# Patient Record
Sex: Female | Born: 1995 | Race: Black or African American | Hispanic: No | Marital: Single
Health system: Southern US, Community
[De-identification: ages and names within clinical notes are randomized; demographics above are authoritative.]

## PROBLEM LIST (undated history)

## (undated) DIAGNOSIS — L0293 Carbuncle, unspecified: Secondary | ICD-10-CM

## (undated) DIAGNOSIS — L0292 Furuncle, unspecified: Secondary | ICD-10-CM

## (undated) DIAGNOSIS — B009 Herpesviral infection, unspecified: Secondary | ICD-10-CM

## (undated) DIAGNOSIS — F419 Anxiety disorder, unspecified: Secondary | ICD-10-CM

## (undated) DIAGNOSIS — T7840XA Allergy, unspecified, initial encounter: Secondary | ICD-10-CM

## (undated) DIAGNOSIS — O24419 Gestational diabetes mellitus in pregnancy, unspecified control: Secondary | ICD-10-CM

## (undated) DIAGNOSIS — L732 Hidradenitis suppurativa: Secondary | ICD-10-CM

## (undated) HISTORY — DX: Carbuncle, unspecified: L02.93

## (undated) HISTORY — DX: Allergy, unspecified, initial encounter: T78.40XA

## (undated) HISTORY — DX: Furuncle, unspecified: L02.92

## (undated) HISTORY — DX: Morbid (severe) obesity due to excess calories: E66.01

## (undated) HISTORY — DX: Anxiety disorder, unspecified: F41.9

## (undated) HISTORY — DX: Herpesviral infection, unspecified: B00.9

---

## 2017-10-16 NOTE — Progress Notes (Addendum)
Gynecology Annual Exam  PCP: Patient, No Pcp Per  Chief Complaint:  Chief Complaint  Patient presents with  . Gynecologic Exam    itchy nipples    History of Present Illness: Patient is a 22 y.o. G0 presents for annual exam. The patient has no complaints today.   LMP: No LMP recorded. Patient has had an implant. Absent secondary to nexplanon  The patient is not sexually active. She currently uses Nexplanon for contraception. She has dyspareunia.  The patient does perform self breast exams.  There is no notable family history of breast or ovarian cancer in her family.  The patient wears seatbelts: yes.  The patient has regular exercise: not asked.    The patient denies current symptoms of depression.    Review of Systems: Review of Systems  Constitutional: Negative for chills and fever.  HENT: Negative for congestion.   Respiratory: Negative for cough and shortness of breath.   Cardiovascular: Negative for chest pain and palpitations.  Gastrointestinal: Negative for abdominal pain, constipation, diarrhea, heartburn, nausea and vomiting.  Genitourinary: Negative for dysuria, frequency and urgency.  Skin: Negative for itching and rash.  Neurological: Negative for dizziness and headaches.  Endo/Heme/Allergies: Negative for polydipsia.  Psychiatric/Behavioral: Negative for depression.    Past Medical History:  History reviewed. No pertinent past medical history.  Past Surgical History:  History reviewed. No pertinent surgical history.  Gynecologic History:  No LMP recorded. Patient has had an implant. Contraception: Luane Schoolexplanon Junly 2017 (previously depo)  Obstetric History: No obstetric history on file.  Family History:  Family History  Problem Relation Age of Onset  . Pancreatic cancer Paternal Grandmother     Social History:  Social History   Socioeconomic History  . Marital status: Single    Spouse name: Not on file  . Number of children: Not on file    . Years of education: Not on file  . Highest education level: Not on file  Social Needs  . Financial resource strain: Not on file  . Food insecurity - worry: Not on file  . Food insecurity - inability: Not on file  . Transportation needs - medical: Not on file  . Transportation needs - non-medical: Not on file  Occupational History  . Not on file  Tobacco Use  . Smoking status: Never Smoker  . Smokeless tobacco: Never Used  Substance and Sexual Activity  . Alcohol use: Yes  . Drug use: No  . Sexual activity: Not Currently    Partners: Male    Birth control/protection: Implant  Other Topics Concern  . Not on file  Social History Narrative  . Not on file    Allergies:  No Known Allergies  Medications: Prior to Admission medications   Not on File    Physical Exam Vitals: There were no vitals taken for this visit.  General: NAD HEENT: normocephalic, anicteric Thyroid: no enlargement, no palpable nodules Pulmonary: No increased work of breathing, CTAB Cardiovascular: RRR, distal pulses 2+ Breast: Breast symmetrical, no tenderness, no palpable nodules or masses, no skin or nipple retraction present, no nipple discharge.  No axillary or supraclavicular lymphadenopathy. Abdomen: NABS, soft, non-tender, non-distended.  Umbilicus without lesions.  No hepatomegaly, splenomegaly or masses palpable. No evidence of hernia  Genitourinary:  External: Normal external female genitalia.  Normal urethral meatus, normal  Bartholin's and Skene's glands.    Vagina: Normal vaginal mucosa, no evidence of prolapse.    Cervix: Grossly normal in appearance, no bleeding  Uterus:  Non-enlarged, mobile, normal contour.  No CMT  Adnexa: ovaries non-enlarged, no adnexal masses  Rectal: deferred  Lymphatic: no evidence of inguinal lymphadenopathy Extremities: no edema, erythema, or tenderness Neurologic: Grossly intact Psychiatric: mood appropriate, affect full  Female chaperone present for  pelvic and breast  portions of the physical exam    Assessment: 22 y.o. No obstetric history on file. routine annual exam  Plan: Problem List Items Addressed This Visit    None    Visit Diagnoses    Tinea corporis    -  Primary   Relevant Medications   nystatin cream (MYCOSTATIN)   Screening for malignant neoplasm of cervix       Relevant Orders   Pap IG w/ reflex to HPV when ASC-U   Breast screening       Encounter for gynecological examination without abnormal finding          1) 4) Gardasil Series discussed and if applicable offered to patient - Patient has previously completed 3 shot series   2) STI screening was offered and declined   3)  ASCCP guidelines and rational discussed.  Patient opts for every 3 years screening interval - no prior pap on record obtained today   4) Contraception - the patient is currently using  Nexplanon.  She is happy with her current form of contraception and plans to continue We discussed safe sex practices to reduce her furture risk of STI's.    5) Currently getting masters in clinical psychiatry  6)  Return in 1 year (on 10/17/2018) for annual.   Vena Austria, MD, [facility], [facility] 10/17/2017, 9:35 AM

## 2017-10-17 ENCOUNTER — Ambulatory Visit: Payer: Self-pay | Admitting: Family Medicine

## 2017-10-17 ENCOUNTER — Encounter: Payer: Self-pay | Admitting: Family Medicine

## 2017-10-17 ENCOUNTER — Ambulatory Visit (INDEPENDENT_AMBULATORY_CARE_PROVIDER_SITE_OTHER): Payer: BLUE CROSS/BLUE SHIELD | Admitting: Obstetrics and Gynecology

## 2017-10-17 ENCOUNTER — Ambulatory Visit (INDEPENDENT_AMBULATORY_CARE_PROVIDER_SITE_OTHER): Payer: BLUE CROSS/BLUE SHIELD | Admitting: Family Medicine

## 2017-10-17 ENCOUNTER — Encounter: Payer: Self-pay | Admitting: Obstetrics and Gynecology

## 2017-10-17 VITALS — BP 124/82 | HR 98 | Temp 98.5°F | Resp 14 | Ht 65.0 in | Wt 241.4 lb

## 2017-10-17 VITALS — BP 112/72 | HR 96 | Ht 64.0 in | Wt 242.0 lb

## 2017-10-17 DIAGNOSIS — Z124 Encounter for screening for malignant neoplasm of cervix: Secondary | ICD-10-CM

## 2017-10-17 DIAGNOSIS — Z8349 Family history of other endocrine, nutritional and metabolic diseases: Secondary | ICD-10-CM | POA: Insufficient documentation

## 2017-10-17 DIAGNOSIS — Z1231 Encounter for screening mammogram for malignant neoplasm of breast: Secondary | ICD-10-CM

## 2017-10-17 DIAGNOSIS — L0293 Carbuncle, unspecified: Secondary | ICD-10-CM

## 2017-10-17 DIAGNOSIS — B354 Tinea corporis: Secondary | ICD-10-CM

## 2017-10-17 DIAGNOSIS — Z01419 Encounter for gynecological examination (general) (routine) without abnormal findings: Secondary | ICD-10-CM

## 2017-10-17 DIAGNOSIS — Z1239 Encounter for other screening for malignant neoplasm of breast: Secondary | ICD-10-CM

## 2017-10-17 HISTORY — DX: Morbid (severe) obesity due to excess calories: E66.01

## 2017-10-17 MED ORDER — NYSTATIN 100000 UNIT/GM EX CREA: 1.0000 "application " | TOPICAL_CREAM | Freq: Two times a day (BID) | CUTANEOUS | 1 refills | Status: DC

## 2017-10-17 NOTE — Progress Notes (Signed)
BP 124/82   Pulse 98   Temp 98.5 F (36.9 C) (Oral)   Resp 14   Ht 5\' 5"  (1.651 m)   Wt 241 lb (109.3 kg)   BMI 40.10 kg/m    Subjective:    Patient ID: Monique Jackson, female    DOB: Aug 01, 1996, 22 y.o.   MRN: [id number]  HPI: Monique Jackson is a 22 y.o. female  Chief Complaint  Patient presents with  . Establish Care  . Obesity    discuss weight  . boils    lower abdomen    HPI  Patient is here to establish care and discuss weight loss Has been going to health dept She has boils on her lower abdomen She says they just come out of nowhere Few times they came up after shaving; ingrown hairs; irritated follicles; trims instead of shaves They go away on their own; used warm compresses; no fevers Mother and grandmother have them too One under the arm, but usually just in the groin Bactrim and clinda; under the umbilicus and along inguinal creases; one lanced She was on a back-to-back regimen of both antibiotics back in college; current places are resolving Grandmother gets period at 53 Had her gyn physical this morning at [facility]  She has morbid obesity; she is struggling; she can go back to when she started college Started Depo at 22 yo, then blew up like a house; 185 pounds to 250 pounds; that was just from Depo Hard to lose the weight now Now using Nexplanon; dropped from 250 to 245 pounds, fluctuating from 240 to 245 pounds Trying to eating better; trying to stick with [employer], hard to find food at home She tries to do healthier sandwiches, wheat She has started with her dad over the summer with walking Still trying to balance 2 jobs and school Have water beside her bed, drinks all day, craves a lot of water at night Sweat coin and samsung phone app Does not eat breakfast Mother has thyroid trouble, had to be removed and on medicine Father has type 2 DM; he is being and does not eat right, lots of sweets; getting under control  Depression screen [facility] 2/9  10/17/2017  Decreased Interest 0  Down, Depressed, Hopeless 1  PHQ - 2 Score 1    Relevant past medical, surgical, family and social history reviewed Past Medical History:  Diagnosis Date  . Anxiety   . Recurrent boils    History reviewed. No pertinent surgical history. Family History  Problem Relation Age of Onset  . Pancreatic cancer Paternal Grandmother   . Hypertension Mother   . Thyroid disease Mother   . Diabetes Father   . Hypertension Maternal Grandmother    Social History   Tobacco Use  . Smoking status: Never Smoker  . Smokeless tobacco: Never Used  Substance Use Topics  . Alcohol use: Yes    Comment: ocassionally  . Drug use: No   Interim medical history since last visit reviewed. Allergies and medications reviewed  Review of Systems Per HPI unless specifically indicated above     Objective:    BP 124/82   Pulse 98   Temp 98.5 F (36.9 C) (Oral)   Resp 14   Ht 5\' 5"  (1.651 m)   Wt 241 lb (109.3 kg)   BMI 40.10 kg/m   Wt Readings from Last 3 Encounters:  10/17/17 241 lb (109.3 kg)  10/17/17 242 lb (109.8 kg)    Physical Exam  Constitutional: She  appears well-developed and well-nourished.  Morbidly obese  HENT:  Mouth/Throat: Mucous membranes are normal.  Eyes: EOM are normal. No scleral icterus.  Cardiovascular: Normal rate and regular rhythm.  Pulmonary/Chest: Effort normal and breath sounds normal.  Neurological: She is alert. She displays no tremor.  Reflex Scores:      Patellar reflexes are 1+ on the right side and 1+ on the left side. Psychiatric: She has a normal mood and affect. Her behavior is normal. Her mood appears not anxious. Her speech is not delayed. She is not slowed. She does not exhibit a depressed mood.    No results found for this or any previous visit.    Assessment & Plan:   Problem List Items Addressed This Visit      Other   Recurrent boils    Patient reports that she does not need oral antibiotics right now;  we discussed the dangers of oral antibiotics, including risk for C diff (she is familiar with this, as her grandmother had it); if taking antibiotics orally in the future, kimchi or yogurt or probiotics; if any watery diarrhea in next two months after finishing, seek care right away; see AVS      Morbid obesity (HCC)    Check TSH free T4 given that her mother has thyroid disease; will refer to nutritionist; she'll monitor her daily calories, daily steps, water intake and return in 3-4 weeks for follow-up, review of data; she wants to try to lose on her own without resorting to pills or shots early on; I agree with this approach; discussed fast food, salt, sugar, fat content in prepared meals at restaurants; she will return in 3-4 weeks for discussion      Relevant Orders   TSH + free T4       Follow up plan: No Follow-up on file.  An after-visit summary was printed and given to the patient at check-out.  Please see the patient instructions which may contain other information and recommendations beyond what is mentioned above in the assessment and plan.  No orders of the defined types were placed in this encounter.   Orders Placed This Encounter  Procedures  . TSH + free T4   Offered other labs including glucose and lipids; patient politely declined

## 2017-10-17 NOTE — Patient Instructions (Addendum)
If you use antibiotics: Please do eat yogurt or kimchi or take a probiotic daily for the next month We want to replace the healthy germs in the gut If you notice foul, watery diarrhea in the next two months, schedule an appointment RIGHT AWAY or go to an urgent care or the emergency room if a holiday or over a weekend  Check out the information at [email or web address].org entitled "Nutrition for Weight Loss: What You Need to Know about Fad Diets" Try to lose between 1-2 pounds per week by taking in fewer calories and burning off more calories You can succeed by limiting portions, limiting foods dense in calories and fat, becoming more active, and drinking 8 glasses of water a day (64 ounces) Don't skip meals, especially breakfast, as skipping meals may alter your metabolism Do not use over-the-counter weight loss pills or gimmicks that claim rapid weight loss A healthy BMI (or body mass index) is between 18.5 and 24.9 You can calculate your ideal BMI at the NIH website [employer]huhttp://www.nhlbi.nih.gov/health/educational/lose_wt/BMI/bmicalc.htm  Check out any calorie counter that is free Keep track of steps Keep track of daiy water intake Bring those with you for our next appointment Read about diet medicine options which we may consider at future visit: Belviq (pill), Contrave (pill), Saxenda (injectable)

## 2017-10-17 NOTE — Patient Instructions (Signed)
If you use antibiotics: Please do eat yogurt or kimchi or take a probiotic daily for the next month We want to replace the healthy germs in the gut If you notice foul, watery diarrhea in the next two months, schedule an appointment RIGHT AWAY or go to an urgent care or the emergency room if a holiday or over a weekend  Check out the information at [email or web address].org entitled "Nutrition for Weight Loss: What You Need to Know about Fad Diets" Try to lose between 1-2 pounds per week by taking in fewer calories and burning off more calories You can succeed by limiting portions, limiting foods dense in calories and fat, becoming more active, and drinking 8 glasses of water a day (64 ounces) Don't skip meals, especially breakfast, as skipping meals may alter your metabolism Do not use over-the-counter weight loss pills or gimmicks that claim rapid weight loss A healthy BMI (or body mass index) is between 18.5 and 24.9 You can calculate your ideal BMI at the NIH website http://www.nhlbi.nih.gov/health/educational/lose_wt/BMI/bmicalc.htm  Check out any calorie counter that is free Keep track of steps Keep track of daiy water intake Bring those with you for our next appointment Read about diet medicine options which we may consider at future visit: Belviq (pill), Contrave (pill), Saxenda (injectable)   

## 2017-10-17 NOTE — Assessment & Plan Note (Signed)
See AVS

## 2017-10-17 NOTE — Patient Instructions (Signed)
Preventive Care 18-39 Years, Female Preventive care refers to lifestyle choices and visits with your health care provider that can promote health and wellness. What does preventive care include?  A yearly physical exam. This is also called an annual well check.  Dental exams once or twice a year.  Routine eye exams. Ask your health care provider how often you should have your eyes checked.  Personal lifestyle choices, including: ? Daily care of your teeth and gums. ? Regular physical activity. ? Eating a healthy diet. ? Avoiding tobacco and drug use. ? Limiting alcohol use. ? Practicing safe sex. ? Taking vitamin and mineral supplements as recommended by your health care provider. What happens during an annual well check? The services and screenings done by your health care provider during your annual well check will depend on your age, overall health, lifestyle risk factors, and family history of disease. Counseling Your health care provider may ask you questions about your:  Alcohol use.  Tobacco use.  Drug use.  Emotional well-being.  Home and relationship well-being.  Sexual activity.  Eating habits.  Work and work Statistician.  Method of birth control.  Menstrual cycle.  Pregnancy history.  Screening You may have the following tests or measurements:  Height, weight, and BMI.  Diabetes screening. This is done by checking your blood sugar (glucose) after you have not eaten for a while (fasting).  Blood pressure.  Lipid and cholesterol levels. These may be checked every 5 years starting at age 66.  Skin check.  Hepatitis C blood test.  Hepatitis B blood test.  Sexually transmitted disease (STD) testing.  BRCA-related cancer screening. This may be done if you have a family history of breast, ovarian, tubal, or peritoneal cancers.  Pelvic exam and Pap test. This may be done every 3 years starting at age 40. Starting at age 59, this may be done every 5  years if you have a Pap test in combination with an HPV test.  Discuss your test results, treatment options, and if necessary, the need for more tests with your health care provider. Vaccines Your health care provider may recommend certain vaccines, such as:  Influenza vaccine. This is recommended every year.  Tetanus, diphtheria, and acellular pertussis (Tdap, Td) vaccine. You may need a Td booster every 10 years.  Varicella vaccine. You may need this if you have not been vaccinated.  HPV vaccine. If you are 69 or younger, you may need three doses over 6 months.  Measles, mumps, and rubella (MMR) vaccine. You may need at least one dose of MMR. You may also need a second dose.  Pneumococcal 13-valent conjugate (PCV13) vaccine. You may need this if you have certain conditions and were not previously vaccinated.  Pneumococcal polysaccharide (PPSV23) vaccine. You may need one or two doses if you smoke cigarettes or if you have certain conditions.  Meningococcal vaccine. One dose is recommended if you are age 27-21 years and a first-year college student living in a residence hall, or if you have one of several medical conditions. You may also need additional booster doses.  Hepatitis A vaccine. You may need this if you have certain conditions or if you travel or work in places where you may be exposed to hepatitis A.  Hepatitis B vaccine. You may need this if you have certain conditions or if you travel or work in places where you may be exposed to hepatitis B.  Haemophilus influenzae type b (Hib) vaccine. You may need this if  you have certain risk factors.  Talk to your health care provider about which screenings and vaccines you need and how often you need them. This information is not intended to replace advice given to you by your health care provider. Make sure you discuss any questions you have with your health care provider. Document Released: 10/19/2001 Document Revised: 05/12/2016  Document Reviewed: 06/24/2015 Elsevier Interactive Patient Education  [employer].

## 2017-10-17 NOTE — Progress Notes (Signed)
** PATIENT HAS TWO CHARTS  NOTE WAS INADVERTENTLY STARTED IN THIS CHART, SINCE THIS WAS THE CHART THAT WAS LINKED TO THE APPOINTMENT INFORMATION HAS BEEN COPIED TO THE CORRECT (ACTIVE) CHART THIS CHART SHOULD BE CONSIDERED INACTIVE / DUPLICATE AND NOT USED FOR FUTURE APPOINTMENTS OR PHONE ENCOUNTERS  -----------------------------------------------------------------------  BP 124/82   Pulse 98   Temp 98.5 F (36.9 C) (Oral)   Resp 14   Ht 5\' 5"  (1.651 m)   Wt 241 lb 6.4 oz (109.5 kg)   LMP 08/01/2017 Comment: nexplonon  SpO2 96%   BMI 40.17 kg/m    Subjective:    Patient ID: Monique Jackson, female    DOB: [birth year], 22 y.o.   MRN: [id number]  HPI: Monique Jackson is a 22 y.o. female  Chief Complaint  Patient presents with  . Establish Care  . Obesity    discuss weight loss  . boils    on lower abdomen    HPI Patient is here to establish care and discuss weight loss Has been going to health dept She has boils on her lower abdomen She says they just come out of nowhere Few times they came up after shaving; ingrown hairs; irritated follicles; trims instead of shaves They go away on their own; used warm compresses; no fevers Mother and grandmother have them too One under the arm, but usually just in the groin Bactrim and clinda; under the umbilicus and along inguinal creases; one lanced She was on a back-to-back regimen of both antibiotics back in college; current places are resolving Grandmother gets period at 49 Had her [facility]yn physical this [facility] at [facility]  She has morbid obesity; she is struggling; she can go back to when she started college Started Depo at 22 yo, then blew up like a house; 185 pounds to 250 pounds; that was just from Depo Hard to lose the weight now Now using Nexplanon; dropped from 250 to 245 pounds, fluctuating from 240 to 245 pounds Trying to eating better; trying to stick with [employer], hard to find food at home She tries to do healthier  sandwiches, wheat She has started with her dad over the summer with walking Still trying to balance 2 jobs and school Have water beside her bed, drinks all day, craves a lot of water at night Sweat coin and samsung phone app Does not eat breakfast Mother has thyroid trouble, had to be removed and on medicine Father has type 2 DM; he is being and does not eat right, lots of sweets; getting under control  Depression screen [facility] 2/9 10/17/2017  Decreased Interest 0  Down, Depressed, Hopeless 0  PHQ - 2 Score 0    Relevant past medical, surgical, family and social history reviewed Past Medical History:  Diagnosis Date  . Anxiety   . Recurrent boils    History reviewed. No pertinent surgical history. Family History  Problem Relation Age of Onset  . Thyroid disease Mother   . Hypertension Mother   . Diabetes Father   . Hypertension Maternal Grandmother   . Gout Maternal Grandmother   . Pancreatic cancer Paternal Grandmother    Social History   Tobacco Use  . Smoking status: Never Smoker  . Smokeless tobacco: Never Used  Substance Use Topics  . Alcohol use: Yes    Comment: occasionally  . Drug use: No    Interim medical history since last visit reviewed. Allergies and medications reviewed  Review of Systems Per HPI unless specifically indicated above  Objective:    BP 124/82   Pulse 98   Temp 98.5 F (36.9 C) (Oral)   Resp 14   Ht 5\' 5"  (1.651 m)   Wt 241 lb 6.4 oz (109.5 kg)   LMP 08/01/2017 Comment: nexplonon  SpO2 96%   BMI 40.17 kg/m   Wt Readings from Last 3 Encounters:  10/17/17 241 lb 6.4 oz (109.5 kg)    Physical Exam  No results found for this or any previous visit.    Assessment & Plan:   Problem List Items Addressed This Visit    None       Follow up plan: No Follow-up on file.  An after-visit summary was printed and given to the patient at check-out.  Please see the patient instructions which may contain other information and  recommendations beyond what is mentioned above in the assessment and plan.  No orders of the defined types were placed in this encounter.   No orders of the defined types were placed in this encounter.

## 2017-10-17 NOTE — Addendum Note (Signed)
Addended by: Lorrene ReidSTAEBLER,  M on: 10/17/2017 09:35 AM   Modules accepted: Orders

## 2017-10-17 NOTE — Assessment & Plan Note (Signed)
Patient reports that she does not need oral antibiotics right now; we discussed the dangers of oral antibiotics, including risk for C diff (she is familiar with this, as her grandmother had it); if taking antibiotics orally in the future, kimchi or yogurt or probiotics; if any watery diarrhea in next two months after finishing, seek care right away; see AVS

## 2017-10-17 NOTE — Assessment & Plan Note (Addendum)
Check TSH free T4 given that her mother has thyroid disease; will refer to nutritionist; she'll monitor her daily calories, daily steps, water intake and return in 3-4 weeks for follow-up, review of data; she wants to try to lose on her own without resorting to pills or shots early on; I agree with this approach; discussed fast food, salt, sugar, fat content in prepared meals at restaurants; she will return in 3-4 weeks for discussion

## 2017-10-18 LAB — TSH+FREE T4: TSH W/REFLEX TO FT4: 0.87 m[IU]/L

## 2017-10-19 LAB — PAP IG W/ RFLX HPV ASCU: PAP Smear Comment: 0

## 2017-10-26 ENCOUNTER — Encounter: Payer: Self-pay | Admitting: Obstetrics and Gynecology

## 2017-11-01 ENCOUNTER — Encounter: Payer: Self-pay | Admitting: Dietician

## 2017-11-01 ENCOUNTER — Encounter: Payer: BLUE CROSS/BLUE SHIELD | Attending: Family Medicine | Admitting: Dietician

## 2017-11-01 NOTE — Progress Notes (Signed)
Medical Nutrition Therapy: Visit start time:1330  end time: 1445 Assessment:  Diagnosis: obesity Psychosocial issues/ stress concerns: Patient describes her stress as "high" and indicates "very well" as to how she is handling her stress. Preferred learning method:  . Auditory . Visual . Hands-on Current weight: 236.3 lbs   Height:65 in Medications, supplements: see list  Progress and evaluation:  Patient in for initial medical nutrition therapy appointment. She reports a weight of 180-185 lbs in high school but states  she gained up to 235 lbs by the time of her college graduation by eating fast foods at [employer] where she worked and also gained after Depo injection. She is in graduate school and presently eats most of her meals "out". She often skips breakfast or eats a sandwich at [facility] at 10:00am. She stays at [facility] on most days for 6 hours working on class work. The tall white chocolate latte that she drinks contains 370 calories. She eats a lunch meal at [facility] or orders a 12" meatball sub at [employer].  She sometimes eats a dinner meal after her classes at 9:00pm. An example is a Steak/Shake steak burger/fries with water or snacks on poptarts or granola bars. She states that in the past week she cannot think of a vegetable or fruit she has eaten other than vegetables on her Sub sandwich. Her main beverages are water and coffee She has been tracking her meals/snacks on myfitness pal and states the app indicates should eat 2200 calories but most of the time, she tracks less than that, recording 1100 calories yesterday.  Based on her weight at MD office and weight today, she has lost 4.7 lbs in 2 weeks. Her present diet is low in fruits, vegetables, fiber, whole grains and calcium sources. She reports a family history of high blood pressure and has noticed the high sodium content on foods eaten "out".  Physical activity: no structured exercise   Nutrition Care  Education:  Basic nutrition/Weight control:  Commended on tracking her food intake to become more mindful of calories,sodium, etc. Instructed on a meal plan based on 1800 calories including carbohydrate counting and how to better balance carbohydrate, protein and non-starchy vegetables. Encouraged to use meal plan only as a guide to make her more mindful of her choices but discouraged being overly restrictive.   Discussed simple meals she could prepare at home since she stated this is something she would like to learn to do. Also, discussed making healthier choices when eating "out". Used food guide plate and food models to discuss portion control.  Nutritional Diagnosis:  NI-5.11.1 Predicted suboptimal nutrient intake As related to significantly low intake of fruits/vegetables, whole grains and calcium sources.  As evidenced by diet history..  Intervention:  Balance meals with protein, 2-4 servings of carbohydrate and non-starchy vegetables. At [facility], choose a latte with non-fat milk. Add as many non-starchy vegetables as possible. Prepare more meals at home. Look at sodium content especially on foods eaten "out". Use Ms. DASH seasonings. ( taco seasoning). Try Morton's lite salt.  Education Materials given:  . Plate Planner . Food lists/ Planning A Balanced Meal . Sample meal pattern/ menus . Snacking handout . Goals/ instructions Learner/ who was taught:  . Patient  Level of understanding: . Partial understanding; needs review/ practice Demonstrated degree of understanding via:   Teach back Learning barriers: . None Willingness to learn/ readiness for change: . Eager, change in progress  Monitoring and Evaluation:  Dietary intake, exercise,  and body weight  follow up:11/29/17 at 1:30pm  Note: At check out, patient stated she has purchased [place] shakes [facility] and asked when she should drink them. She stated, "I've paid for them, so I'm going  to use them". Since she often skips breakfast, suggested that she use one shake per day as a meal substitute rather than skipping a meal.

## 2017-11-01 NOTE — Patient Instructions (Addendum)
Balance meals with protein, 2-4 servings of carbohydrate and non-starchy vegetables. At [facility], choose a latte with non-fat milk. Add as many non-starchy vegetables as possible. Prepare more meals at home. Look at sodium content especially on foods eaten "out". Use Ms. DASH seasonings. ( taco seasoning). Try Morton's lite salt.

## 2017-11-06 ENCOUNTER — Encounter: Payer: Self-pay | Admitting: Family Medicine

## 2017-11-10 ENCOUNTER — Ambulatory Visit: Payer: BLUE CROSS/BLUE SHIELD | Admitting: Family Medicine

## 2017-11-24 ENCOUNTER — Encounter: Payer: Self-pay | Admitting: Dietician

## 2017-11-24 ENCOUNTER — Encounter: Payer: Self-pay | Admitting: Family Medicine

## 2017-11-28 ENCOUNTER — Ambulatory Visit: Payer: BLUE CROSS/BLUE SHIELD | Admitting: Family Medicine

## 2017-11-29 ENCOUNTER — Ambulatory Visit: Payer: BLUE CROSS/BLUE SHIELD | Admitting: Dietician

## 2017-12-01 ENCOUNTER — Encounter: Payer: Self-pay | Admitting: Dietician

## 2017-12-28 ENCOUNTER — Ambulatory Visit: Payer: BLUE CROSS/BLUE SHIELD | Admitting: Family Medicine

## 2018-01-21 ENCOUNTER — Encounter: Payer: Self-pay | Admitting: Family Medicine

## 2018-01-24 ENCOUNTER — Telehealth: Payer: Self-pay

## 2018-01-24 NOTE — Telephone Encounter (Signed)
Pt called triage today stating she feels like she has another yeast infection (was seen at urgent care in march for this) I advised pt we would need to see her for this issue before we can RX anything. She is going to try an OTC medication and give it a week to see if it works. If not she will call back and schedule an appt. Pt appreciaitve of help

## 2018-01-26 ENCOUNTER — Ambulatory Visit: Payer: BLUE CROSS/BLUE SHIELD | Admitting: Nurse Practitioner

## 2018-01-26 ENCOUNTER — Telehealth: Payer: Self-pay | Admitting: Nurse Practitioner

## 2018-01-26 ENCOUNTER — Encounter: Payer: Self-pay | Admitting: Nurse Practitioner

## 2018-01-26 DIAGNOSIS — Z6839 Body mass index (BMI) 39.0-39.9, adult: Secondary | ICD-10-CM | POA: Diagnosis not present

## 2018-01-26 DIAGNOSIS — Z202 Contact with and (suspected) exposure to infections with a predominantly sexual mode of transmission: Secondary | ICD-10-CM

## 2018-01-26 DIAGNOSIS — N898 Other specified noninflammatory disorders of vagina: Secondary | ICD-10-CM

## 2018-01-26 NOTE — Patient Instructions (Addendum)
- Recommend 64 ounces of water a day- Start with 2 venti waters a day and then in 2 weeks upgrade to 3 - Being aware of what we are eating, portions and drinking water before eating - Eating a small snack within an hour of being awake ( bananas and peanut butter, applesauce, [place] & cheese roll up. - Look up intuitive eating     Vaginitis Vaginitis is an inflammation of the vagina. It can happen when the normal bacteria and yeast in the vagina grow too much. There are different types. Treatment will depend on the type you have. Follow these instructions at home:  Take all medicines as told by your doctor.  Keep your vagina area clean and dry. Avoid soap. Rinse the area with water.  Avoid washing and cleaning out the vagina (douching).  Do not use tampons or have sex (intercourse) until your treatment is done.  Wipe from front to back after going to the restroom.  Wear cotton underwear.  Avoid wearing underwear while you sleep until your vaginitis is gone.  Avoid tight pants. Avoid underwear or nylons without a cotton panel.  Take off wet clothing (such as a bathing suit) as soon as you can.  Use mild, unscented products. Avoid fabric softeners and scented: ? Feminine sprays. ? Laundry detergents. ? Tampons. ? Soaps or bubble baths.  Practice safe sex and use condoms. Get help right away if:  You have belly (abdominal) pain.  You have a fever or lasting symptoms for more than 2-3 days.  You have a fever and your symptoms suddenly get worse. This information is not intended to replace advice given to you by your health care provider. Make sure you discuss any questions you have with your health care provider. Document Released: 11/19/2008 Document Revised: 01/29/2016 Document Reviewed: 02/03/2012 Elsevier Interactive Patient Education  2017 Elsevier Inc.  Calorie Counting for [employer] Calories are units of energy. Your body needs a certain amount of calories from  food to keep you going throughout the day. When you eat more calories than your body needs, your body stores the extra calories as fat. When you eat fewer calories than your body needs, your body burns fat to get the energy it needs. Calorie counting means keeping track of how many calories you eat and drink each day. Calorie counting can be helpful if you need to lose weight. If you make sure to eat fewer calories than your body needs, you should lose weight. Ask your health care provider what a healthy weight is for you. For calorie counting to work, you will need to eat the right number of calories in a day in order to lose a healthy amount of weight per week. A dietitian can help you determine how many calories you need in a day and will give you suggestions on how to reach your calorie goal.  A healthy amount of weight to lose per week is usually 1-2 lb (0.5-0.9 kg). This usually means that your daily calorie intake should be reduced by 500-750 calories.  Eating 1,200 - 1,500 calories per day can help most women lose weight.  Eating 1,500 - 1,800 calories per day can help most men lose weight.  What is my plan? My goal is to have __________ calories per day. If I have this many calories per day, I should lose around __________ pounds per week. What do I need to know about calorie counting? In order to meet your daily calorie goal, you  will need to:  Find out how many calories are in each food you would like to eat. Try to do this before you eat.  Decide how much of the food you plan to eat.  Write down what you ate and how many calories it had. Doing this is called keeping a food log.  To successfully lose weight, it is important to balance calorie counting with a healthy lifestyle that includes regular activity. Aim for 150 minutes of moderate exercise (such as walking) or 75 minutes of vigorous exercise (such as running) each week. Where do I find calorie information?  The number of  calories in a food can be found on a Nutrition Facts label. If a food does not have a Nutrition Facts label, try to look up the calories online or ask your dietitian for help. Remember that calories are listed per serving. If you choose to have more than one serving of a food, you will have to multiply the calories per serving by the amount of servings you plan to eat. For example, the label on a package of bread might say that a serving size is 1 slice and that there are 90 calories in a serving. If you eat 1 slice, you will have eaten 90 calories. If you eat 2 slices, you will have eaten 180 calories. How do I keep a food log? Immediately after each meal, record the following information in your food log:  What you ate. Don't forget to include toppings, sauces, and other extras on the food.  How much you ate. This can be measured in cups, ounces, or number of items.  How many calories each food and drink had.  The total number of calories in the meal.  Keep your food log near you, such as in a small notebook in your pocket, or use a mobile app or website. Some programs will calculate calories for you and show you how many calories you have left for the day to meet your goal. What are some calorie counting tips?  Use your calories on foods and drinks that will fill you up and not leave you hungry: ? Some examples of foods that fill you up are nuts and nut butters, vegetables, lean proteins, and high-fiber foods like whole grains. High-fiber foods are foods with more than 5 g fiber per serving. ? Drinks such as sodas, specialty coffee drinks, alcohol, and juices have a lot of calories, yet do not fill you up.  Eat nutritious foods and avoid empty calories. Empty calories are calories you get from foods or beverages that do not have many vitamins or protein, such as candy, sweets, and soda. It is better to have a nutritious high-calorie food (such as an avocado) than a food with few nutrients  (such as a bag of chips).  Know how many calories are in the foods you eat most often. This will help you calculate calorie counts faster.  Pay attention to calories in drinks. Low-calorie drinks include water and unsweetened drinks.  Pay attention to nutrition labels for "low fat" or "fat free" foods. These foods sometimes have the same amount of calories or more calories than the full fat versions. They also often have added sugar, starch, or salt, to make up for flavor that was removed with the fat.  Find a way of tracking calories that works for you. Get creative. Try different apps or programs if writing down calories does not work for you. What are some portion control  tips?  Know how many calories are in a serving. This will help you know how many servings of a certain food you can have.  Use a measuring cup to measure serving sizes. You could also try weighing out portions on a kitchen scale. With time, you will be able to estimate serving sizes for some foods.  Take some time to put servings of different foods on your favorite plates, bowls, and cups so you know what a serving looks like.  Try not to eat straight from a bag or box. Doing this can lead to overeating. Put the amount you would like to eat in a cup or on a plate to make sure you are eating the right portion.  Use smaller plates, glasses, and bowls to prevent overeating.  Try not to multitask (for example, watch TV or use your computer) while eating. If it is time to eat, sit down at a table and enjoy your food. This will help you to know when you are full. It will also help you to be aware of what you are eating and how much you are eating. What are tips for following this plan? Reading food labels  Check the calorie count compared to the serving size. The serving size may be smaller than what you are used to eating.  Check the source of the calories. Make sure the food you are eating is high in vitamins and protein  and low in saturated and trans fats. Shopping  Read nutrition labels while you shop. This will help you make healthy decisions before you decide to purchase your food.  Make a grocery list and stick to it. Cooking  Try to cook your favorite foods in a healthier way. For example, try baking instead of frying.  Use low-fat dairy products. Meal planning  Use more fruits and vegetables. Half of your plate should be fruits and vegetables.  Include lean proteins like poultry and fish. How do I count calories when eating out?  Ask for smaller portion sizes.  Consider sharing an entree and sides instead of getting your own entree.  If you get your own entree, eat only half. Ask for a box at the beginning of your meal and put the rest of your entree in it so you are not tempted to eat it.  If calories are listed on the menu, choose the lower calorie options.  Choose dishes that include vegetables, fruits, whole grains, low-fat dairy products, and lean protein.  Choose items that are boiled, broiled, grilled, or steamed. Stay away from items that are buttered, battered, fried, or served with cream sauce. Items labeled "crispy" are usually fried, unless stated otherwise.  Choose water, low-fat milk, unsweetened iced tea, or other drinks without added sugar. If you want an alcoholic beverage, choose a lower calorie option such as a glass of wine or light beer.  Ask for dressings, sauces, and syrups on the side. These are usually high in calories, so you should limit the amount you eat.  If you want a salad, choose a garden salad and ask for grilled meats. Avoid extra toppings like bacon, cheese, or fried items. Ask for the dressing on the side, or ask for olive oil and vinegar or lemon to use as dressing.  Estimate how many servings of a food you are given. For example, a serving of cooked rice is  cup or about the size of half a baseball. Knowing serving sizes will help you be aware of how  much food you are eating at restaurants. The list below tells you how big or small some common portion sizes are based on everyday objects: ? 1 oz-4 stacked dice. ? 3 oz-1 deck of cards. ? 1 tsp-1 die. ? 1 Tbsp- a ping-pong ball. ? 2 Tbsp-1 ping-pong ball. ?  cup- baseball. ? 1 cup-1 baseball. Summary  Calorie counting means keeping track of how many calories you eat and drink each day. If you eat fewer calories than your body needs, you should lose weight.  A healthy amount of weight to lose per week is usually 1-2 lb (0.5-0.9 kg). This usually means reducing your daily calorie intake by 500-750 calories.  The number of calories in a food can be found on a Nutrition Facts label. If a food does not have a Nutrition Facts label, try to look up the calories online or ask your dietitian for help.  Use your calories on foods and drinks that will fill you up, and not on foods and drinks that will leave you hungry.  Use smaller plates, glasses, and bowls to prevent overeating. This information is not intended to replace advice given to you by your health care provider. Make sure you discuss any questions you have with your health care provider. Document Released: 08/23/2005 Document Revised: 07/23/2016 Document Reviewed: 07/23/2016 Elsevier Interactive Patient Education  [employer].

## 2018-01-26 NOTE — Progress Notes (Addendum)
Name: Monique Jackson   MRN: [id number]    DOB: [birth year]   Date:01/26/2018       Progress Note  Subjective  Chief Complaint  Chief Complaint  Patient presents with  . Weight Check  . Vaginitis    itching    HPI  Obesity Patient states has a lot of stress with two job, school, family issues but has been working on healthy living and lifestyles changes. Focusing on what she's been eating. Sometimes she skips meals and has been trying to be more consistent. Lots of low-fiber carbs- pasta, mac and cheese, breads but has been more aware of it. States had been being on top of eating habits earlier this month and then had more stress and hit up the comfort foods, stopped drinking water as much and stopped taking probiotic.  Wt Readings from Last 3 Encounters:  01/26/18 236 lb 6.4 oz (107.2 kg)  11/01/17 236 lb 4.8 oz (107.2 kg)  10/17/17 241 lb 6.4 oz (109.5 kg)    Vaginal itching Patient had douched after intercourse (unprotected) and has some vaginal itching since then. Denies vaginal discharge or pain. Has been trying [place] V OTC for vaginal itching sts it stops the itch but then it comes back-  contains boric acid.   Patient Active Problem List   Diagnosis Date Noted  . Morbid obesity ([place]) 10/17/2017  . Recurrent boils 10/17/2017  . Family history of thyroid disorder 10/17/2017  . Recurrent boils 10/17/2017    Past Medical History:  Diagnosis Date  . Anxiety   . Morbid obesity ([place]) 10/17/2017  . Recurrent boils     No past surgical history on file.  Social History   Tobacco Use  . Smoking status: Never Smoker  . Smokeless tobacco: Never Used  Substance Use Topics  . Alcohol use: Yes    Comment: ocassionally     Current Outpatient Medications:  .  etonogestrel (NEXPLANON) 68 MG IMPL implant, 1 each by Subdermal route once., Disp: , Rfl:  .  nystatin cream (MYCOSTATIN), Apply 1 application topically 2 (two) times daily. (Patient not taking: Reported on 10/17/2017),  Disp: 30 g, Rfl: 1  No Known Allergies  ROS  No other specific complaints in a complete review of systems (except as listed in HPI above).  Objective  Vitals:   01/26/18 1001  BP: 122/70  Pulse: 84  Resp: 18  Temp: 99.1 F (37.3 C)  TempSrc: Oral  SpO2: 94%  Weight: 236 lb 6.4 oz (107.2 kg)  Height: _0  (1.651 m)    Body mass index is 39.34 kg/m.  Nursing Note and Vital Signs reviewed.  Physical Exam   Constitutional: Patient appears well-developed and well-nourished. Obese. distress.  Cardiovascular: Normal rate, Pulmonary/Chest: Effort normal  GYN: external perineal area within normal limits, no rash, noted, clear discharge noted- sts from vaginal suppository used, no foul odor.  Psychiatric: Patient has a normal mood and affect. behavior is normal. Judgment and thought content normal.  No results found for this or any previous visit (from the past 72 hour(s)).  Assessment & Plan  1. Morbid obesity ([place]) -discussed diet   2. Vaginal itching -no douching  - C. trachomatis/N. gonorrhoeae RNA - WET PREP BY MOLECULAR PROBE - pending results will order medicine  3. BMI 39.0-39.9,adult -discussed diet   4. Possible exposure to STD - discussed safe sex  - C. trachomatis/N. gonorrhoeae RNA - WET PREP BY MOLECULAR PROBE  - Recommend 64 ounces of water a day-  Start with 2 venti waters a day and then in 2 weeks upgrade to 3 - Being aware of what we are eating, portions and drinking water before eating - Eating a small snack within an hour of being awake ( bananas and peanut butter, applesauce, [place] & cheese roll up. -  intuitive eating     -Red flags and when to present for emergency care or RTC including fever >101.38F, chest pain, shortness of breath, new/worsening/un-resolving symptoms,  reviewed with patient at time of visit. Follow up and care instructions discussed and provided in AVS.  ------------------------------------------- I have reviewed this  encounter including the documentation in this note and/or discussed this patient with the provider, Suezanne Cheshire DNP AGNP-C. I am certifying that I agree with the content of this note as supervising physician. Enid Derry, [facility] 01/26/2018, 5:24 PM

## 2018-01-26 NOTE — Telephone Encounter (Signed)
Copied from CRM [id number]. Topic: General - Other >> Jan 26, 2018 11:06 AM [name] wrote: Reason for CRM: Pt states she was told her Rx would be called in to her pharmacy but it has not been called in. Pt requested a call back regarding Rx.  >> Jan 26, 2018  6:55 PM [name] wrote: Patient says she got Leanne Chang B and Monistat for her diagnosis and thought NP [place] was going to call something in for her. Please advise.

## 2018-01-27 ENCOUNTER — Other Ambulatory Visit: Payer: Self-pay | Admitting: Nurse Practitioner

## 2018-01-27 DIAGNOSIS — N760 Acute vaginitis: Principal | ICD-10-CM

## 2018-01-27 DIAGNOSIS — B3731 Acute candidiasis of vulva and vagina: Secondary | ICD-10-CM

## 2018-01-27 DIAGNOSIS — B9689 Other specified bacterial agents as the cause of diseases classified elsewhere: Secondary | ICD-10-CM

## 2018-01-27 DIAGNOSIS — B373 Candidiasis of vulva and vagina: Secondary | ICD-10-CM

## 2018-01-27 LAB — WET PREP BY MOLECULAR PROBE
CANDIDA SPECIES: DETECTED — AB
MICRO NUMBER:: 90629502
SOURCE:: 0
SPECIMEN QUALITY: ADEQUATE
TRICHOMONAS VAG: NOT DETECTED

## 2018-01-27 LAB — C. TRACHOMATIS/N. GONORRHOEAE RNA
C. TRACHOMATIS RNA, TMA: NOT DETECTED
N. gonorrhoeae RNA, TMA: NOT DETECTED

## 2018-01-27 MED ORDER — FLUCONAZOLE 150 MG PO TABS: 150.0000 mg | ORAL_TABLET | Freq: Once | ORAL | 0 refills | Status: AC

## 2018-01-27 MED ORDER — METRONIDAZOLE 500 MG PO TABS: 500.0000 mg | ORAL_TABLET | Freq: Two times a day (BID) | ORAL | 0 refills | Status: AC

## 2018-01-27 NOTE — Telephone Encounter (Signed)
Left message for patient that script has been sent

## 2018-01-27 NOTE — Telephone Encounter (Signed)
Held rx until wet prep results. + for BV and yeast will treat both, rx's sent to pharmacy

## 2018-02-23 ENCOUNTER — Ambulatory Visit: Payer: Self-pay | Admitting: Nurse Practitioner

## 2018-04-24 ENCOUNTER — Telehealth: Payer: Self-pay | Admitting: Obstetrics and Gynecology

## 2018-04-24 ENCOUNTER — Ambulatory Visit: Payer: BLUE CROSS/BLUE SHIELD | Admitting: Obstetrics and Gynecology

## 2018-04-24 NOTE — Telephone Encounter (Signed)
Patient scheduled 8/26 for nexplanon replacement with SDJ.

## 2018-04-24 NOTE — Telephone Encounter (Signed)
Noted. Will order to arrive by apt date/time. 

## 2018-05-01 ENCOUNTER — Ambulatory Visit (INDEPENDENT_AMBULATORY_CARE_PROVIDER_SITE_OTHER): Payer: BLUE CROSS/BLUE SHIELD | Admitting: Obstetrics and Gynecology

## 2018-05-01 ENCOUNTER — Encounter: Payer: Self-pay | Admitting: Obstetrics and Gynecology

## 2018-05-01 VITALS — BP 110/76 | HR 85 | Ht 65.0 in | Wt 237.0 lb

## 2018-05-01 DIAGNOSIS — Z3049 Encounter for surveillance of other contraceptives: Secondary | ICD-10-CM | POA: Diagnosis not present

## 2018-05-01 DIAGNOSIS — Z3046 Encounter for surveillance of implantable subdermal contraceptive: Secondary | ICD-10-CM

## 2018-05-01 DIAGNOSIS — Z30017 Encounter for initial prescription of implantable subdermal contraceptive: Secondary | ICD-10-CM

## 2018-05-01 NOTE — Progress Notes (Signed)
GYNECOLOGY PROCEDURE NOTE  Nexplanon removal discussed in detail.  Risks of infection, bleeding, nerve injury all reviewed.  Patient understands risks and desires to proceed.  Verbal consent obtained.  Patient is certain she wants the Nexplanon removed.  All questions answered.  Procedure: Patient placed in dorsal supine with left arm above head, elbow flexed at 90 degrees, arm resting on examination table.  Nexplanon identified without problems.  Betadine scrub x3.  1 ml of 1% lidocaine injected under Nexplanondevice without problems.  Sterile gloves applied.  Small 0.5cm incision made at distal tip of Nexplanon device with 11 blade scalpel.  Nexplanon brought to incision and grasped with a small kelly clamp.  Nexplanon removed intact without problems.  Pressure applied to incision.  Hemostasis obtained.  Steri-strip applied, followed by bandage and compression dressing.  Patient tolerated procedure well.  No complications.   GYNECOLOGY PROCEDURE NOTE  Patient is a 22 y.o. No obstetric history on file. presenting for Nexplanon insertion as her desires means of contraception.  She provided informed consent, signed copy in the chart, time out was performed. Self reported LMP of No LMP recorded. Patient has had an implant.  She understands that Nexplanon is a progesterone only therapy, and that patients often patients have irregular and unpredictable vaginal bleeding or amenorrhea. She understands that other side effects are possible related to systemic progesterone, including but not limited to, headaches, breast tenderness, nausea, and irritability. While effective at preventing pregnancy long acting reversible contraceptives do not prevent transmission of sexually transmitted diseases and use of barrier methods for this purpose was discussed. The placement procedure for Nexplanon was reviewed with the patient in detail including risks of nerve injury, infection, bleeding and injury to other muscles  or tendons. She understands that the Nexplanon implant is good for 3 years and needs to be removed at the end of that time.  She understands that Nexplanon is an extremely effective option for contraception, with failure rate of <1%. This information is reviewed today and all questions were answered. Informed consent was obtained, both verbally and written.   The patient is healthy and has no contraindications to Nexplanon use. Urine pregnancy test was performed today and was negative.  Procedure Appropriate time out taken.  Patient placed in dorsal supine with left arm above head, elbow flexed at 90 degrees, arm resting on examination table with hand behind her head.  The bicipital grove was palpated and site 8-10cm proximal to the medial epicondyle was indentified.  Per the manufacturer's recommendations, the insertion site was marked along a line 3-5 cm posterior (toward the triceps) to the bicipital groove and at 8-10 cm medial to the medial epicondyle. The insertion site was prepped with a two betadine swabs and then injected with 3 mL of 1% lidocaine without epinephrine.  Nexplanon removed form sterile blister packaging,  Device confirmed in needle, before inserting full length of needle, tenting up the skin as the needle was advance.  The drug eluting rod was then deployed by pulling back the slider per the manufactures recommendation.  The implant was palpable by the clinician as well as the patient.  The insertion site covered dressed with a 1/2" steri-strip before applying a kerlex bandage pressure dressing..Minimal blood loss was noted during the procedure.  The patient tolerated the procedure well.  Of note, the location of her Nexplanon was moved to be in compliance with the FDA recommended location for the device.  She had two incisions today.  A 1/2"  steri-strip was applied to each site, then a 4x4 piece of gauze was applied and a single Kerlex was wrapped around her arm for reduction of  hematoma.    She was instructed to wear the bandage for 24 hours, call with any signs of infection.  She was given the Nexplanon card and instructed to have the rod removed in 3 years.  Thomasene Mohair, MD, [facility], [facility] 05/01/2018 1:48 PM   Assessment: 22 y.o. year old female now s/p uncomplicated Nexplanon removal and re-insertion.  Plan: Patient given post procedure precautions and asked to call for fever, chills, redness or drainage from her incision, bleeding from incision.  She understands she will likely have a small bruise near site of removal and can remove bandage tomorrow and steri-strips in approximately 1 week.  Thomasene Mohair, MD, [facility], [facility] 05/01/2018 1:51 PM

## 2018-05-02 NOTE — Telephone Encounter (Signed)
Nexplanon rcvd/charged 05/01/18

## 2018-06-06 ENCOUNTER — Encounter: Payer: Self-pay | Admitting: Family Medicine

## 2018-06-06 ENCOUNTER — Ambulatory Visit (INDEPENDENT_AMBULATORY_CARE_PROVIDER_SITE_OTHER): Payer: BLUE CROSS/BLUE SHIELD | Admitting: Family Medicine

## 2018-06-06 VITALS — BP 118/64 | HR 89 | Temp 98.3°F | Ht 65.0 in | Wt 234.9 lb

## 2018-06-06 DIAGNOSIS — S01331A Puncture wound without foreign body of right ear, initial encounter: Secondary | ICD-10-CM

## 2018-06-06 DIAGNOSIS — E669 Obesity, unspecified: Secondary | ICD-10-CM

## 2018-06-06 DIAGNOSIS — L089 Local infection of the skin and subcutaneous tissue, unspecified: Secondary | ICD-10-CM | POA: Diagnosis not present

## 2018-06-06 DIAGNOSIS — R599 Enlarged lymph nodes, unspecified: Secondary | ICD-10-CM

## 2018-06-06 DIAGNOSIS — Z6841 Body Mass Index (BMI) 40.0 and over, adult: Secondary | ICD-10-CM | POA: Insufficient documentation

## 2018-06-06 MED ORDER — SULFAMETHOXAZOLE-TRIMETHOPRIM 800-160 MG PO TABS: 1.0000 | ORAL_TABLET | Freq: Two times a day (BID) | ORAL | 0 refills | Status: DC

## 2018-06-06 NOTE — Progress Notes (Signed)
BP 118/64   Pulse 89   Temp 98.3 F (36.8 C) (Oral)   Ht 5\' 5"  (1.651 m)   Wt 234 lb 14.4 oz (106.5 kg)   LMP 03/28/2018   SpO2 98%   BMI 39.09 kg/m    Subjective:    Patient ID: Monique Jackson, female    DOB: [birth year], 22 y.o.   MRN: [id number]  HPI: Monique Jackson is a 22 y.o. female  Chief Complaint  Patient presents with  . Adenopathy    onset sept 29 right side only, no symptoms    HPI Patient is here for an acute visit; lymph node on the right side; popped on Sunday; no sore throat; no ear pain; uncomfortable tinge when she woke up that morning; does have cavities and going to have those filled on October 8th; no bleeding or irritation of the gums; no fevers; no chills; new piercings in May of the right ear; can feel little knot right in front of the right ear; cleaning with wound wash, but $5.50, so she is using something homemade; non-iodinated salt and warm water; keloid with the upper piercing, and that is going down; she is   She has been trying to lose weight; working out doing [employer]; she is really motivated; has lost 3-4 pounds; she can relate to her (the lady on the video) and gets motivation from her work-outs; she asked several questions about intermittent fasting, length of exercise, boosting metabolism, etc.  Depression screen [facility] 2/9 06/06/2018 11/01/2017 10/17/2017 10/17/2017  Decreased Interest 1 0 0 0  Down, Depressed, Hopeless 0 0 1 0  PHQ - 2 Score 1 0 1 0  Altered sleeping 0 - - -  Tired, decreased energy 0 - - -  Change in appetite 0 - - -  Feeling bad or failure about yourself  0 - - -  Trouble concentrating 0 - - -  Moving slowly or fidgety/restless 0 - - -  Suicidal thoughts 0 - - -  PHQ-9 Score 1 - - -  Difficult doing work/chores Not difficult at all - - -   Fall Risk  06/06/2018 11/01/2017 10/17/2017 10/17/2017  Falls in the past year? No Yes No No  Number falls in past yr: - 1 - -  Injury with Fall? - Yes - -  Comment - swollen  left arm but is resolving - -    Relevant past medical, surgical, family and social history reviewed Past Medical History:  Diagnosis Date  . Anxiety   . Morbid obesity (HCC) 10/17/2017  . Recurrent boils    History reviewed. No pertinent surgical history. Family History  Problem Relation Age of Onset  . Pancreatic cancer Paternal Grandmother   . Hypertension Mother   . Thyroid disease Mother   . Diabetes Father   . Hypertension Maternal Grandmother   . Gout Maternal Grandmother    Social History   Tobacco Use  . Smoking status: Never Smoker  . Smokeless tobacco: Never Used  Substance Use Topics  . Alcohol use: Yes    Comment: ocassionally  . Drug use: Yes    Types: Marijuana     Office Visit from 06/06/2018 in [place]  AUDIT-C Score  3      Interim medical history since last visit reviewed. Allergies and medications reviewed  Review of Systems Per HPI unless specifically indicated above     Objective:    BP 118/64   Pulse 89   Temp  98.3 F (36.8 C) (Oral)   Ht 5\' 5"  (1.651 m)   Wt 234 lb 14.4 oz (106.5 kg)   LMP 03/28/2018   SpO2 98%   BMI 39.09 kg/m   Wt Readings from Last 3 Encounters:  06/06/18 234 lb 14.4 oz (106.5 kg)  05/01/18 237 lb (107.5 kg)  01/26/18 236 lb 6.4 oz (107.2 kg)    Physical Exam  Constitutional: She appears well-developed and well-nourished.  obese  HENT:  Right Ear: Tympanic membrane and ear canal normal.  Left Ear: Tympanic membrane and ear canal normal.  Ears:  Mouth/Throat: Mucous membranes are normal.  Crusty discharge at the site of ear piercings RIGHT helix  Eyes: EOM are normal. No scleral icterus.  Cardiovascular: Normal rate and regular rhythm.  Pulmonary/Chest: Effort normal and breath sounds normal.  Lymphadenopathy:  Palpable pre-auricular lymph node on the RIGHT, as well as tonsillar lymph nodes on the RIGHT; no other enlarged lymph nodes  Psychiatric: She has a normal mood and  affect. Her mood appears not anxious. She does not exhibit a depressed mood.    Results for orders placed or performed in visit on 01/26/18  C. trachomatis/N. gonorrhoeae RNA  Result Value Ref Range   C. trachomatis RNA, TMA NOT DETECTED NOT DETECT   N. gonorrhoeae RNA, TMA NOT DETECTED NOT DETECT  WET PREP BY MOLECULAR PROBE  Result Value Ref Range   MICRO NUMBER: [id number]    SPECIMEN QUALITY: ADEQUATE    SOURCE: .    STATUS: FINAL    Trichomonas vaginosis Not Detected    Gardnerella vaginalis (A)     Detected. Increased levels of G. vaginalis may not be significant in the absence of signs and symptoms of bacterial vaginosis.   Candida species Detected (A)       Assessment & Plan:   Problem List Items Addressed This Visit      Other   Obesity (BMI 35.0-39.9 without comorbidity)    Encouragement given; discussed scientific 7 minute work out modified; 150 minutes of activity a week; regular movement through the day       Other Visit Diagnoses    Reactive lymphadenopathy    -  Primary   likely from earrings; clean well, remove if worsening; return for recheck nodes in 3-4 weeks   Pierced ear infection, right, initial encounter       Relevant Medications   sulfamethoxazole-trimethoprim (BACTRIM DS) 800-160 MG tablet       Follow up plan: Return in about 3 weeks (around 06/27/2018) for follow-up visit with Dr. Sherie Don.  An after-visit summary was printed and given to the patient at check-out.  Please see the patient instructions which may contain other information and recommendations beyond what is mentioned above in the assessment and plan.  Meds ordered this encounter  Medications  . sulfamethoxazole-trimethoprim (BACTRIM DS) 800-160 MG tablet    Sig: Take 1 tablet by mouth 2 (two) times daily.    Dispense:  14 tablet    Refill:  0    No orders of the defined types were placed in this encounter.

## 2018-06-06 NOTE — Assessment & Plan Note (Signed)
Encouragement given; discussed scientific 7 minute work out modified; 150 minutes of activity a week; regular movement through the day

## 2018-06-06 NOTE — Patient Instructions (Signed)
Please do eat yogurt or kimchi or take a probiotic daily for the next month We want to replace the healthy germs in the gut If you notice foul, watery diarrhea in the next two months, schedule an appointment RIGHT AWAY or go to an urgent care or the emergency room if a holiday or over a weekend Start antibiotics Clean the ear well Remove earrings is persistent infection Call with any concerns I am proud of you for your weight loss efforts Check out the information at familydoctor.org entitled "Nutrition for Weight Loss: What You Need to Know about Fad Diets" Try to lose between 1-2 pounds per week by taking in fewer calories and burning off more calories You can succeed by limiting portions, limiting foods dense in calories and fat, becoming more active, and drinking 8 glasses of water a day (64 ounces) Don't skip meals, especially breakfast, as skipping meals may alter your metabolism Do not use over-the-counter weight loss pills or gimmicks that claim rapid weight loss A healthy BMI (or body mass index) is between 18.5 and 24.9 You can calculate your ideal BMI at the NIH website [employer]

## 2018-06-17 ENCOUNTER — Encounter: Payer: Self-pay | Admitting: Family Medicine

## 2018-06-17 DIAGNOSIS — R599 Enlarged lymph nodes, unspecified: Secondary | ICD-10-CM

## 2018-06-20 ENCOUNTER — Ambulatory Visit: Payer: BLUE CROSS/BLUE SHIELD | Admitting: Family Medicine

## 2018-06-23 ENCOUNTER — Encounter: Payer: Self-pay | Admitting: Family Medicine

## 2018-06-27 ENCOUNTER — Ambulatory Visit: Payer: BLUE CROSS/BLUE SHIELD | Admitting: Family Medicine

## 2018-06-30 ENCOUNTER — Other Ambulatory Visit: Payer: Self-pay | Admitting: Otolaryngology

## 2018-06-30 DIAGNOSIS — R590 Localized enlarged lymph nodes: Secondary | ICD-10-CM

## 2018-07-03 ENCOUNTER — Other Ambulatory Visit: Payer: Self-pay | Admitting: Otolaryngology

## 2018-07-04 ENCOUNTER — Ambulatory Visit
Admission: RE | Admit: 2018-07-04 | Discharge: 2018-07-04 | Disposition: A | Discharge status: Home / Self Care | Payer: BLUE CROSS/BLUE SHIELD | Source: Ambulatory Visit | Attending: Otolaryngology | Admitting: Otolaryngology

## 2018-07-04 DIAGNOSIS — R590 Localized enlarged lymph nodes: Secondary | ICD-10-CM

## 2018-07-04 MED ORDER — IOPAMIDOL (ISOVUE-300) INJECTION 61%
75.0000 mL | Freq: Once | INTRAVENOUS | Status: AC | PRN
  Administered 2018-07-04: 75 mL via INTRAVENOUS

## 2018-08-21 ENCOUNTER — Ambulatory Visit: Payer: BLUE CROSS/BLUE SHIELD | Admitting: Family Medicine

## 2018-10-18 ENCOUNTER — Encounter: Payer: Self-pay | Admitting: Obstetrics and Gynecology

## 2018-10-18 ENCOUNTER — Ambulatory Visit (INDEPENDENT_AMBULATORY_CARE_PROVIDER_SITE_OTHER): Payer: BLUE CROSS/BLUE SHIELD | Admitting: Obstetrics and Gynecology

## 2018-10-18 ENCOUNTER — Other Ambulatory Visit (HOSPITAL_COMMUNITY)
Admission: RE | Admit: 2018-10-18 | Discharge: 2018-10-18 | Disposition: A | Discharge status: Home / Self Care | Payer: BLUE CROSS/BLUE SHIELD | Source: Ambulatory Visit | Attending: Obstetrics and Gynecology | Admitting: Obstetrics and Gynecology

## 2018-10-18 VITALS — BP 120/80 | Ht 65.0 in | Wt 228.0 lb

## 2018-10-18 DIAGNOSIS — Z124 Encounter for screening for malignant neoplasm of cervix: Secondary | ICD-10-CM | POA: Diagnosis not present

## 2018-10-18 DIAGNOSIS — Z113 Encounter for screening for infections with a predominantly sexual mode of transmission: Secondary | ICD-10-CM

## 2018-10-18 DIAGNOSIS — Z01419 Encounter for gynecological examination (general) (routine) without abnormal findings: Secondary | ICD-10-CM

## 2018-10-18 NOTE — Progress Notes (Signed)
Gynecology Annual Exam  PCP: Kerman PasseyLada, Melinda P, MD  Chief Complaint:  Chief Complaint  Patient presents with  . Gynecologic Exam    History of Present Illness: Patient is a 23 y.o. G0P0000 presents for annual exam. The patient has no complaints today.   LMP: No LMP recorded. Patient has had an implant.   She currently uses Nexplanon for contraception. She denies dyspareunia.    There is no notable family history of breast or ovarian cancer in her family.  The patient wears seatbelts: yes.  The patient has regular exercise: not asked.    The patient denies current symptoms of depression.    Review of Systems: Review of Systems  Constitutional: Negative for chills and fever.  HENT: Negative for congestion.   Respiratory: Negative for cough and shortness of breath.   Cardiovascular: Negative for chest pain and palpitations.  Gastrointestinal: Negative for abdominal pain, constipation, diarrhea, heartburn, nausea and vomiting.  Genitourinary: Negative for dysuria, frequency and urgency.  Skin: Negative for itching and rash.  Neurological: Negative for dizziness and headaches.  Endo/Heme/Allergies: Negative for polydipsia.  Psychiatric/Behavioral: Negative for depression.    Past Medical History:  Past Medical History:  Diagnosis Date  . Anxiety   . HSV-2 (herpes simplex virus 2) infection   . Morbid obesity (HCC) 10/17/2017  . Recurrent boils     Past Surgical History:  History reviewed. No pertinent surgical history.  Gynecologic History:  No LMP recorded. Patient has had an implant. 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 Pancreatic cancer Paternal Grandmother   . Hypertension Mother   . Thyroid disease Mother   . Diabetes Father   .  Hypertension Maternal Grandmother   . Gout Maternal Grandmother     Social History:  Social History   Socioeconomic History  . Marital status: Single    Spouse name: Not on file  . Number of children: Not on file  . Years of education: Not on file  . Highest education level: Not on file  Occupational History  . Not on file  Social Needs  . Financial resource strain: Not on file  . Food insecurity:    Worry: Not on file    Inability: Not on file  . Transportation needs:    Medical: Not on file    Non-medical: Not on file  Tobacco Use  . Smoking status: Never Smoker  . Smokeless tobacco: Never Used  Substance and Sexual Activity  . Alcohol use: Yes    Comment: ocassionally  . Drug use: Yes    Types: Marijuana  . Sexual activity: Not Currently    Partners: Male    Birth control/protection: Implant  Lifestyle  . Physical activity:    Days per week: 0 days    Minutes per session: 0 min  . Stress: Not at all  Relationships  . Social connections:    Talks on phone: More than three times a week    Gets together: Three times a week    Attends religious service: Never    Active member of club or organization: No    Attends meetings of clubs or organizations: Never    Relationship status: Never married  . Intimate partner violence:    Fear of current or ex partner: No    Emotionally abused: No  Physically abused: No    Forced sexual activity: No  Other Topics Concern  . Not on file  Social History Narrative   ** Merged History Encounter **        Allergies:  No Known Allergies  Medications: Prior to Admission medications   Medication Sig Start Date End Date Taking? Authorizing Provider  etonogestrel (NEXPLANON) 68 MG IMPL implant 1 each by Subdermal route once.   Yes [provider]  nystatin cream (MYCOSTATIN) Apply 1 application topically 2 (two) times daily. Patient not taking: Reported on 10/17/2017 10/17/17   Vena Austria, , MD    sulfamethoxazole-trimethoprim (BACTRIM DS) 800-160 MG tablet Take 1 tablet by mouth 2 (two) times daily. Patient not taking: Reported on 10/18/2018 06/06/18   Kerman PasseyLada, Melinda P, MD    Physical Exam Vitals: Blood pressure 120/80, height 5\' 5"  (1.651 m), weight 228 lb (103.4 kg).  General: NAD HEENT: normocephalic, anicteric Thyroid: no enlargement, no palpable nodules Pulmonary: No increased work of breathing, CTAB Cardiovascular: RRR, distal pulses 2+ Breast: Breast symmetrical, no tenderness, no palpable nodules or masses, no skin or nipple retraction present, no nipple discharge.  No axillary or supraclavicular lymphadenopathy. Abdomen: NABS, soft, non-tender, non-distended.  Umbilicus without lesions.  No hepatomegaly, splenomegaly or masses palpable. No evidence of hernia  Genitourinary:  External: Normal external female genitalia.  Normal urethral meatus, normal Bartholin's and Skene's glands.    Vagina: Normal vaginal mucosa, no evidence of prolapse.    Cervix: Grossly normal in appearance, no bleeding  Uterus: Non-enlarged, mobile, normal contour.  No CMT  Adnexa: ovaries non-enlarged, no adnexal masses  Rectal: deferred  Lymphatic: no evidence of inguinal lymphadenopathy Extremities: no edema, erythema, or tenderness Neurologic: Grossly intact Psychiatric: mood appropriate, affect full  Female chaperone present for pelvic and breast  portions of the physical exam    Assessment: 23 y.o. G0P0000 routine annual exam  Plan: Problem List Items Addressed This Visit    None    Visit Diagnoses    Screening for malignant neoplasm of cervix    -  Primary   Relevant Orders   Cytology, thin prep pap (cervical)   Encounter for gynecological examination without abnormal finding       Routine screening for STI (sexually transmitted infection)       Relevant Orders   Cytology, thin prep pap (cervical)      1) 4) Gardasil Series discussed and if applicable offered to patient -  Patient has previously completed 3 shot series   2) STI screening  was notoffered and therefore not obtained  3)  ASCCP guidelines and rational discussed.  Patient opts for every 3 years screening interval - follow up this year for prior LGSIL pap last year  4) Contraception - the patient is currently using  Nexplanon.  She is happy with her current form of contraception and plans to continue We discussed safe sex practices to reduce her furture risk of STI's.    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   Vena Austria , MD, [facility] FrederickFACOG [facility], [facility] 10/18/2018, 12:15 PM

## 2018-10-24 LAB — CYTOLOGY - PAP
CHLAMYDIA, DNA PROBE: NEGATIVE
DIAGNOSIS: UNDETERMINED — AB
HPV: NOT DETECTED
Neisseria Gonorrhea: NEGATIVE

## 2018-10-30 ENCOUNTER — Other Ambulatory Visit: Payer: Self-pay | Admitting: Obstetrics and Gynecology

## 2018-10-30 MED ORDER — SULFAMETHOXAZOLE-TRIMETHOPRIM 800-160 MG PO TABS: 1.0000 | ORAL_TABLET | Freq: Two times a day (BID) | ORAL | 1 refills | Status: DC

## 2019-04-23 ENCOUNTER — Encounter: Payer: Self-pay | Admitting: Family Medicine

## 2019-04-23 ENCOUNTER — Other Ambulatory Visit: Payer: Self-pay | Admitting: Nurse Practitioner

## 2019-04-23 DIAGNOSIS — Z111 Encounter for screening for respiratory tuberculosis: Secondary | ICD-10-CM

## 2019-04-28 LAB — QUANTIFERON-TB GOLD PLUS
Mitogen-NIL: >10 IU/mL
NIL: 0.02 IU/mL
QuantiFERON-TB Gold Plus: NEGATIVE
TB1-NIL: <0 IU/mL
TB2-NIL: 0 IU/mL

## 2019-04-30 ENCOUNTER — Encounter: Payer: Self-pay | Admitting: Family Medicine

## 2019-05-23 ENCOUNTER — Encounter: Payer: BLUE CROSS/BLUE SHIELD | Admitting: Family Medicine

## 2019-06-11 ENCOUNTER — Ambulatory Visit (INDEPENDENT_AMBULATORY_CARE_PROVIDER_SITE_OTHER): Payer: BLUE CROSS/BLUE SHIELD | Admitting: Family Medicine

## 2019-06-11 ENCOUNTER — Other Ambulatory Visit (HOSPITAL_COMMUNITY): Admission: RE | Admit: 2019-06-11 | Payer: BLUE CROSS/BLUE SHIELD | Source: Ambulatory Visit

## 2019-06-11 ENCOUNTER — Other Ambulatory Visit: Payer: Self-pay

## 2019-06-11 ENCOUNTER — Encounter: Payer: Self-pay | Admitting: Family Medicine

## 2019-06-11 VITALS — BP 130/70 | HR 100 | Temp 98.5°F | Resp 14 | Ht 65.0 in | Wt 237.1 lb

## 2019-06-11 DIAGNOSIS — L732 Hidradenitis suppurativa: Secondary | ICD-10-CM | POA: Insufficient documentation

## 2019-06-11 DIAGNOSIS — Z13220 Encounter for screening for lipoid disorders: Secondary | ICD-10-CM

## 2019-06-11 DIAGNOSIS — Z1329 Encounter for screening for other suspected endocrine disorder: Secondary | ICD-10-CM

## 2019-06-11 DIAGNOSIS — Z7251 High risk heterosexual behavior: Secondary | ICD-10-CM

## 2019-06-11 DIAGNOSIS — Z130 Encounter for screening for diseases of the blood and blood-forming organs and certain disorders involving the immune mechanism: Secondary | ICD-10-CM

## 2019-06-11 DIAGNOSIS — Z113 Encounter for screening for infections with a predominantly sexual mode of transmission: Secondary | ICD-10-CM

## 2019-06-11 DIAGNOSIS — E669 Obesity, unspecified: Secondary | ICD-10-CM | POA: Diagnosis not present

## 2019-06-11 DIAGNOSIS — Z8349 Family history of other endocrine, nutritional and metabolic diseases: Secondary | ICD-10-CM

## 2019-06-11 DIAGNOSIS — Z0000 Encounter for general adult medical examination without abnormal findings: Secondary | ICD-10-CM

## 2019-06-11 DIAGNOSIS — Z13228 Encounter for screening for other metabolic disorders: Secondary | ICD-10-CM

## 2019-06-11 NOTE — Progress Notes (Signed)
Patient: Monique Jackson, Female    DOB: December 04, 1995, 23 y.o.   MRN: [id number] Monique Grana, PA-C Visit Date: 06/11/2019  Today's Provider: Delsa Grana, PA-C   Chief Complaint  Patient presents with   Annual Exam   Obesity   Subjective:   Annual physical exam:  Monique Jackson is a 23 y.o. female who presents today for health maintenance and annual & complete physical exam.   Exercise/Activity:  Work out program, periodically, not consistent with it yet, will do 2-3 days in a row and then sometimes it takes a break (varies from 2-4 days a week)  Diet/nutrition:  Healthier diet, trying to avoid carbs, except in rice, adding more veggies, she is not counting calories  She reports she is sleeping okay - she fees like she "probably gets too much sleep" -for example over the weekend she did not intend to but she did fall asleep and took a nap, this caused her to stay up late and get less sleep over the nighttime, she was also with 1 of her sexual partners and she only got a few hours of sleep this morning but she feels well rested and does not typically fall asleep while at work or while driving just when unplanned over the weekend if she is resting she may doze off.  Busy in school and working  USPSTF grade A and B recommendations - reviewed and addressed today  Depression:  Phq 9 completed today by patient, was reviewed by me with patient in the room, score is  negative, pt feels good PHQ 2/9 Scores 06/11/2019 06/06/2018 11/01/2017 10/17/2017  PHQ - 2 Score 0 1 0 1  PHQ- 9 Score 0 1 - -   Depression screen [facility] 2/9 06/11/2019 06/06/2018 11/01/2017 10/17/2017 10/17/2017  Decreased Interest 0 1 0 0 0  Down, Depressed, Hopeless 0 0 0 1 0  PHQ - 2 Score 0 1 0 1 0  Altered sleeping 0 0 - - -  Tired, decreased energy 0 0 - - -  Change in appetite 0 0 - - -  Feeling bad or failure about yourself  0 0 - - -  Trouble concentrating 0 0 - - -  Moving slowly or fidgety/restless 0 0 - - -    Suicidal thoughts 0 0 - - -  PHQ-9 Score 0 1 - - -  Difficult doing work/chores Not difficult at all Not difficult at all - - -    Hep C Screening:not indicated STD testing and prevention (HIV/chl/gon/syphilis): done recently, but wants testing, has multiple partners, female partners, not using condoms with all   Intimate partner violence:  Feels same and denies abuse with any partners, lives alone Sexual History/Pain during Intercourse:  No pain with intercourse Single Menstrual History/LMP/Abnormal Bleeding:  Patient's last menstrual period was 05/09/2019. Incontinence Symptoms: none  Breast cancer:  BRCA gene screening: none Cervical cancer screening: UTD  Family hx of cancers - breast, ovarian, uterine, colon  Osteoporosis:   None indicated for age No results found for: HMDEXASCAN   Skin cancer:  Skin CA hx -NO Last skin survey:  Not done  Colorectal cancer:  Not indicated  Lung cancer:   Low Dose CT Chest recommended if Age 24-80 years, 30 pack-year currently smoking OR have quit w/in 15years. Patient does not qualify.   Social History   Tobacco Use   Smoking status: Never Smoker   Smokeless tobacco: Never Used  Substance Use Topics   Alcohol use: Yes  Comment: ocassionally   Holidays and special occassions - hard liquor or wine  Alcohol screening:   Office Visit from 06/11/2019 in [facility]  AUDIT-C Score  2      ECG:  none  Blood pressure/Hypertension: BP Readings from Last 3 Encounters:  06/11/19 130/70  10/18/18 120/80  06/06/18 118/64   Weight/Obesity: Wt Readings from Last 3 Encounters:  06/11/19 237 lb 1.6 oz (107.5 kg)  10/18/18 228 lb (103.4 kg)  06/06/18 234 lb 14.4 oz (106.5 kg)   BMI Readings from Last 3 Encounters:  06/11/19 39.46 kg/m  10/18/18 37.94 kg/m  06/06/18 39.09 kg/m    Lipids:  No results found for: CHOL No results found for: HDL No results found for: LDLCALC No results found for: TRIG No  results found for: CHOLHDL No results found for: LDLDIRECT Based on the results of lipid panel his/her cardiovascular risk factor ( using [place] )  in the next 10 years is: The ASCVD Risk score Monique Jackson DC Jr., et al., 2013) failed to calculate for the following reasons:   The 2013 ASCVD risk score is only valid for ages 54 to 68 Glucose:  No results found for: GLUCOSE, GLUCAP  Advanced Care Planning:  A voluntary discussion about advance care planning including the explanation and discussion of advance directives.   Discussed health care proxy and Living will, and the patient was able to identify a health care proxy as - best friend Monique Jackson.   Patient does not have a living will at present time.  Social History      She  reports that she has never smoked. She has never used smokeless tobacco. She reports current alcohol use. She reports current drug use. Drug: Marijuana.       Social History   Socioeconomic History   Marital status: Single    Spouse name: Not on file   Number of children: Not on file   Years of education: Not on file   Highest education level: Not on file  Occupational History   Not on file  Social Needs   Financial resource strain: Not on file   Food insecurity    Worry: Not on file    Inability: Not on file   Transportation needs    Medical: Not on file    Non-medical: Not on file  Tobacco Use   Smoking status: Never Smoker   Smokeless tobacco: Never Used  Substance and Sexual Activity   Alcohol use: Yes    Comment: ocassionally   Drug use: Yes    Types: Marijuana   Sexual activity: Not Currently    Partners: Male    Birth control/protection: Implant  Lifestyle   Physical activity    Days per week: 0 days    Minutes per session: 0 min   Stress: Not at all  Relationships   Social connections    Talks on phone: More than three times a week    Gets together: Three times a week    Attends religious service: Never     Active member of club or organization: No    Attends meetings of clubs or organizations: Never    Relationship status: Never married  Other Topics Concern   Not on file  Social History Narrative   ** Merged History Encounter **        Family History        Family Status  Relation Name Status   PGM  Deceased  pancreatic cancer   Mother  Alive   Father  Alive   Sister  Alive   Brother  Alive   MGM  Alive   MGF  Deceased       HIV/AIDS   Sister  Alive   Sister  Alive   Sister  Alive        Her family history includes Diabetes in her father; Gout in her maternal grandmother; Hypertension in her maternal grandmother and mother; Pancreatic cancer in her paternal grandmother; Thyroid disease in her mother.       Family History  Problem Relation Age of Onset   Pancreatic cancer Paternal Grandmother    Hypertension Mother    Thyroid disease Mother    Diabetes Father    Hypertension Maternal Grandmother    Gout Maternal Grandmother     Patient Active Problem List   Diagnosis Date Noted   High risk heterosexual behavior 06/11/2019   Hidradenitis suppurativa 06/11/2019   Obesity (BMI 35.0-39.9 without comorbidity) 06/06/2018   Family history of thyroid disorder 10/17/2017    History reviewed. No pertinent surgical history.   Current Outpatient Medications:    etonogestrel (NEXPLANON) 68 MG IMPL implant, 1 each by Subdermal route once., Disp: , Rfl:    nystatin cream (MYCOSTATIN), Apply 1 application topically 2 (two) times daily. (Patient not taking: Reported on 10/17/2017), Disp: 30 g, Rfl: 1   sulfamethoxazole-trimethoprim (BACTRIM DS) 800-160 MG tablet, Take 1 tablet by mouth 2 (two) times daily. (Patient not taking: Reported on 10/18/2018), Disp: 14 tablet, Rfl: 0   sulfamethoxazole-trimethoprim (BACTRIM DS,SEPTRA DS) 800-160 MG tablet, Take 1 tablet by mouth 2 (two) times daily. (Patient not taking: Reported on 06/11/2019), Disp: 14 tablet,  Rfl: 1  No Known Allergies  Patient Care Team: Monique Grana, PA-C as PCP - General (Family Medicine) Lada, Satira Anis, MD (Family Medicine)  I personally reviewed active problem list, medication list, allergies, family history, social history, health maintenance, notes from last encounter, lab results with the patient/caregiver today.  Review of Systems  Constitutional: Negative.  Negative for activity change, appetite change, fatigue and unexpected weight change.  HENT: Negative.   Eyes: Negative.   Respiratory: Negative.  Negative for shortness of breath.   Cardiovascular: Negative.  Negative for chest pain, palpitations and leg swelling.  Gastrointestinal: Negative.  Negative for abdominal pain and blood in stool.  Endocrine: Negative.   Genitourinary: Negative.   Musculoskeletal: Negative.  Negative for arthralgias, gait problem, joint swelling and myalgias.  Skin: Negative.  Negative for color change, pallor and rash.  Allergic/Immunologic: Negative.   Neurological: Negative.  Negative for syncope and weakness.  Hematological: Negative.   Psychiatric/Behavioral: Negative.  Negative for confusion, dysphoric mood, self-injury and suicidal ideas. The patient is not nervous/anxious.   All other systems reviewed and are negative.         Objective:   Vitals:  Vitals:   06/11/19 1058  BP: 130/70  Pulse: 100  Resp: 14  Temp: 98.5 F (36.9 C)  SpO2: 99%  Weight: 237 lb 1.6 oz (107.5 kg)  Height: 5' 5"  (1.651 m)    Body mass index is 39.46 kg/m.  Physical Exam Vitals signs and nursing note reviewed.  Constitutional:      General: She is not in acute distress.    Appearance: Normal appearance. She is well-developed. She is obese. She is not ill-appearing, toxic-appearing or diaphoretic.  HENT:     Head: Normocephalic and atraumatic.     Right Ear: External  ear normal.     Left Ear: External ear normal.     Nose: Nose normal.     Mouth/Throat:     Mouth: Mucous  membranes are moist.     Pharynx: Oropharynx is clear. Uvula midline. No oropharyngeal exudate or posterior oropharyngeal erythema.  Eyes:     General: Lids are normal. No scleral icterus.       Right eye: No discharge.        Left eye: No discharge.     Conjunctiva/sclera: Conjunctivae normal.     Pupils: Pupils are equal, round, and reactive to light.  Neck:     Musculoskeletal: Normal range of motion and neck supple.     Thyroid: No thyroid mass, thyromegaly or thyroid tenderness.     Trachea: Phonation normal. No tracheal deviation.  Cardiovascular:     Rate and Rhythm: Normal rate and regular rhythm.     Chest Wall: PMI is not displaced.     Pulses: Normal pulses.          Radial pulses are 2+ on the right side and 2+ on the left side.       Posterior tibial pulses are 2+ on the right side and 2+ on the left side.     Heart sounds: Normal heart sounds. No murmur. No friction rub. No gallop.   Pulmonary:     Effort: Pulmonary effort is normal. No respiratory distress.     Breath sounds: Normal breath sounds. No stridor. No wheezing, rhonchi or rales.  Chest:     Chest wall: No tenderness.  Abdominal:     General: Bowel sounds are normal. There is no distension.     Palpations: Abdomen is soft.     Tenderness: There is no abdominal tenderness. There is no guarding or rebound.  Musculoskeletal: Normal range of motion.        General: No deformity.     Right lower leg: No edema.     Left lower leg: No edema.  Lymphadenopathy:     Cervical: No cervical adenopathy.  Skin:    General: Skin is warm and dry.     Capillary Refill: Capillary refill takes less than 2 seconds.     Coloration: Skin is not jaundiced or pale.     Findings: No rash or wound.     Nails: There is no clubbing.   Neurological:     Mental Status: She is alert and oriented to person, place, and time.     Motor: No abnormal muscle tone.     Gait: Gait normal.  Psychiatric:        Attention and Perception:  Attention normal.        Mood and Affect: Mood normal.        Speech: Speech normal.        Behavior: Behavior normal. Behavior is cooperative.        Thought Content: Thought content does not include homicidal or suicidal ideation. Thought content does not include homicidal or suicidal plan.     Fall Risk: Fall Risk  06/11/2019 06/06/2018 11/01/2017 10/17/2017 10/17/2017  Falls in the past year? 0 No Yes No No  Number falls in past yr: 0 - 1 - -  Injury with Fall? 0 - Yes - -  Comment - - swollen left arm but is resolving - -    Functional Status Survey: Is the patient deaf or have difficulty hearing?: No Does the patient have difficulty seeing, even when  wearing glasses/contacts?: No Does the patient have difficulty concentrating, remembering, or making decisions?: No Does the patient have difficulty walking or climbing stairs?: No Does the patient have difficulty dressing or bathing?: No Does the patient have difficulty doing errands alone such as visiting a doctor's office or shopping?: No   Assessment & Plan:    CPE completed today   USPSTF grade A and B recommendations reviewed with patient; age-appropriate recommendations, preventive care, screening tests, etc discussed and encouraged; healthy living encouraged; see AVS for patient education given to patient   Discussed importance of 150 minutes of physical activity weekly, AHA exercise recommendations given to pt in AVS/handout   Discussed importance of healthy diet:  eating lean meats and proteins, avoiding trans fats and saturated fats, avoid simple sugars and excessive carbs in diet, eat 6 servings of fruit/vegetables daily and drink plenty of water and avoid sweet beverages.     Recommended pt to do annual eye exam and routine dental exams/cleanings   Depression, alcohol, fall screening completed as documented above and per flowsheets   Reviewed Health Maintenance: Health Maintenance  Topic Date Due   HIV  Screening  06/25/2010   PAP-Cervical Cytology Screening  10/18/2021   PAP SMEAR-Modifier  10/18/2021   TETANUS/TDAP  09/06/2022   INFLUENZA VACCINE  Completed     Immunizations: Immunization History  Administered Date(s) Administered   Tdap 09/06/2012   1. Adult general medical exam Physical done today - CBC with Differential/Platelet - COMPLETE METABOLIC PANEL WITH GFR - Lipid panel - Hemoglobin A1c - TSH  2. Hidradenitis suppurativa She has improved this with changing some of her grooming habits including her deodorant and shaving, no recent flares and she has not seen a specialist for this.  No sinus tracts or extensive scarring  3. Obesity (BMI 35.0-39.9 without comorbidity) Discussed calorie reduction, diet and exercise for weight loss for healthier BMI.  Encourage patient to do sustainable small changes that she can build on not large exercise programs or fad diets, information given and handout today - Lipid panel - Hemoglobin A1c - TSH  4. Family history of thyroid disorder Patient wishes for her thyroid levels to be screened, exam unremarkable - TSH  5. Screening for endocrine, metabolic and immunity disorder - CBC with Differential/Platelet - Lipid panel - Hemoglobin A1c - TSH  6. Screening for lipoid disorders - COMPLETE METABOLIC PANEL WITH GFR  7. Screening for deficiency anemia - CBC with Differential/Platelet  8. Screening examination for STD (sexually transmitted disease) & 9. High risk heterosexual behavior Multiple female partners using condoms with all of them except for 1, she was encouraged to use condoms or barrier methods of birth control with all to prevent chance of STDs, she does have Nexplanon to prevent pregnancy - Cytology (oral, anal, urethral) ancillary only - HIV antibody (with reflex)       Monique Grana, PA-C 06/11/19 12:28 PM  [facility]

## 2019-06-11 NOTE — Patient Instructions (Signed)
Preventive Care 21-24 Years Old, Female Preventive care refers to visits with your health care provider and lifestyle choices that can promote health and wellness. This includes:  A yearly physical exam. This may also be called an annual well check.  Regular dental visits and eye exams.  Immunizations.  Screening for certain conditions.  Healthy lifestyle choices, such as eating a healthy diet, getting regular exercise, not using drugs or products that contain nicotine and tobacco, and limiting alcohol use. What can I expect for my preventive care visit? Physical exam Your health care provider will check your:  Height and weight. This may be used to calculate body mass index (BMI), which tells if you are at a healthy weight.  Heart rate and blood pressure.  Skin for abnormal spots. Counseling Your health care provider may ask you questions about your:  Alcohol, tobacco, and drug use.  Emotional well-being.  Home and relationship well-being.  Sexual activity.  Eating habits.  Work and work environment.  Method of birth control.  Menstrual cycle.  Pregnancy history. What immunizations do I need?  Influenza (flu) vaccine  This is recommended every year. Tetanus, diphtheria, and pertussis (Tdap) vaccine  You may need a Td booster every 10 years. Varicella (chickenpox) vaccine  You may need this if you have not been vaccinated. Human papillomavirus (HPV) vaccine  If recommended by your health care provider, you may need three doses over 6 months. Measles, mumps, and rubella (MMR) vaccine  You may need at least one dose of MMR. You may also need a second dose. Meningococcal conjugate (MenACWY) vaccine  One dose is recommended if you are age 19-21 years and a first-year college student living in a residence hall, or if you have one of several medical conditions. You may also need additional booster doses. Pneumococcal conjugate (PCV13) vaccine  You may need  this if you have certain conditions and were not previously vaccinated. Pneumococcal polysaccharide (PPSV23) vaccine  You may need one or two doses if you smoke cigarettes or if you have certain conditions. Hepatitis A vaccine  You may need this if you have certain conditions or if you travel or work in places where you may be exposed to hepatitis A. Hepatitis B vaccine  You may need this if you have certain conditions or if you travel or work in places where you may be exposed to hepatitis B. Haemophilus influenzae type b (Hib) vaccine  You may need this if you have certain conditions. You may receive vaccines as individual doses or as more than one vaccine together in one shot (combination vaccines). Talk with your health care provider about the risks and benefits of combination vaccines. What tests do I need?  Blood tests  Lipid and cholesterol levels. These may be checked every 5 years starting at age 20.  Hepatitis C test.  Hepatitis B test. Screening  Diabetes screening. This is done by checking your blood sugar (glucose) after you have not eaten for a while (fasting).  Sexually transmitted disease (STD) testing.  BRCA-related cancer screening. This may be done if you have a family history of breast, ovarian, tubal, or peritoneal cancers.  Pelvic exam and Pap test. This may be done every 3 years starting at age 21. Starting at age 30, this may be done every 5 years if you have a Pap test in combination with an HPV test. Talk with your health care provider about your test results, treatment options, and if necessary, the need for more tests.   Follow these instructions at home: Eating and drinking   Eat a diet that includes fresh fruits and vegetables, whole grains, lean protein, and low-fat dairy.  Take vitamin and mineral supplements as recommended by your health care provider.  Do not drink alcohol if: ? Your health care provider tells you not to drink. ? You are  pregnant, may be pregnant, or are planning to become pregnant.  If you drink alcohol: ? Limit how much you have to 0-1 drink a day. ? Be aware of how much alcohol is in your drink. In the [place] one drink equals one 12 oz bottle of beer (355 mL), one 5 oz glass of wine (148 mL), or one 1 oz glass of hard liquor (44 mL). Lifestyle  Take daily care of your teeth and gums.  Stay active. Exercise for at least 30 minutes on 5 or more days each week.  Do not use any products that contain nicotine or tobacco, such as cigarettes, e-cigarettes, and chewing tobacco. If you need help quitting, ask your health care provider.  If you are sexually active, practice safe sex. Use a condom or other form of birth control (contraception) in order to prevent pregnancy and STIs (sexually transmitted infections). If you plan to become pregnant, see your health care provider for a preconception visit. What's next?  Visit your health care provider once a year for a well check visit.  Ask your health care provider how often you should have your eyes and teeth checked.  Stay up to date on all vaccines. This information is not intended to replace advice given to you by your health care provider. Make sure you discuss any questions you have with your health care provider. Document Released: 10/19/2001 Document Revised: 05/04/2018 Document Reviewed: 05/04/2018 Elsevier Patient Education  2020 [employer]vier Inc[employer]  

## 2019-06-12 ENCOUNTER — Other Ambulatory Visit: Payer: Self-pay | Admitting: Family Medicine

## 2019-06-12 DIAGNOSIS — R7303 Prediabetes: Secondary | ICD-10-CM

## 2019-06-12 DIAGNOSIS — E782 Mixed hyperlipidemia: Secondary | ICD-10-CM | POA: Insufficient documentation

## 2019-06-12 DIAGNOSIS — E119 Type 2 diabetes mellitus without complications: Secondary | ICD-10-CM | POA: Insufficient documentation

## 2019-06-13 ENCOUNTER — Telehealth: Payer: Self-pay

## 2019-06-13 DIAGNOSIS — R7989 Other specified abnormal findings of blood chemistry: Secondary | ICD-10-CM

## 2019-06-13 NOTE — Telephone Encounter (Signed)
-----   Message from Delsa Grana, [place] sent at 06/12/2019  4:43 PM EDT ----- Please add on free T4 for dx abnormal TSH lab  When resulted we can call the patient with the remainder of the results showing prediabetes with hemoglobin A1c 6.0, high cholesterol Otherwise good liver function, kidney function and no anemia  Needs to work on increasing exercise, decreasing calories, avoiding simple sugars, sweets and bad fats such as trans-fat and saturated fat

## 2019-06-14 LAB — COMPLETE METABOLIC PANEL WITH GFR
AG Ratio: 1.8 (calc) (ref 1.0–2.5)
ALT: 11 U/L (ref 6–29)
AST: 12 U/L (ref 10–30)
Albumin: 4.2 g/dL (ref 3.6–5.1)
Alkaline phosphatase (APISO): 52 U/L (ref 31–125)
BUN: 10 mg/dL (ref 7–25)
CO2: 27 mmol/L (ref 20–32)
Calcium: 8.9 mg/dL (ref 8.6–10.2)
Chloride: 107 mmol/L (ref 98–110)
Creat: 0.76 mg/dL (ref 0.50–1.10)
GFR, Est African American: 128 mL/min/{1.73_m2} (ref >=60)
GFR, Est Non African American: 111 mL/min/{1.73_m2} (ref >=60)
Globulin: 2.4 g/dL (calc) (ref 1.9–3.7)
Glucose, Bld: 107 mg/dL — ABNORMAL HIGH (ref 65–99)
Potassium: 3.9 mmol/L (ref 3.5–5.3)
Sodium: 141 mmol/L (ref 135–146)
Total Bilirubin: 0.3 mg/dL (ref 0.2–1.2)
Total Protein: 6.6 g/dL (ref 6.1–8.1)

## 2019-06-14 LAB — HEMOGLOBIN A1C
Hgb A1c MFr Bld: 6 % of total Hgb — ABNORMAL HIGH (ref <5.7)
Mean Plasma Glucose: 126 (calc)
eAG (mmol/L): 7 (calc)

## 2019-06-14 LAB — TEST AUTHORIZATION

## 2019-06-14 LAB — CBC WITH DIFFERENTIAL/PLATELET
Absolute Monocytes: 382 cells/uL (ref 200–950)
Basophils Absolute: 60 cells/uL (ref 0–200)
Basophils Relative: 0.9 %
Eosinophils Absolute: 80 cells/uL (ref 15–500)
Eosinophils Relative: 1.2 %
HCT: 37.5 % (ref 35.0–45.0)
Hemoglobin: 12.1 g/dL (ref 11.7–15.5)
Lymphs Abs: 1822 cells/uL (ref 850–3900)
MCH: 28 pg (ref 27.0–33.0)
MCHC: 32.3 g/dL (ref 32.0–36.0)
MCV: 86.8 fL (ref 80.0–100.0)
MPV: 9.8 fL (ref 7.5–12.5)
Monocytes Relative: 5.7 %
Neutro Abs: 4355 cells/uL (ref 1500–7800)
Neutrophils Relative %: 65 %
Platelets: 339 10*3/uL (ref 140–400)
RBC: 4.32 10*6/uL (ref 3.80–5.10)
RDW: 12.7 % (ref 11.0–15.0)
Total Lymphocyte: 27.2 %
WBC: 6.7 10*3/uL (ref 3.8–10.8)

## 2019-06-14 LAB — LIPID PANEL
Cholesterol: 212 mg/dL — ABNORMAL HIGH (ref <200)
HDL: 50 mg/dL (ref >=50)
LDL Cholesterol (Calc): 145 mg/dL (calc) — ABNORMAL HIGH
Non-HDL Cholesterol (Calc): 162 mg/dL (calc) — ABNORMAL HIGH (ref <130)
Total CHOL/HDL Ratio: 4.2 (calc) (ref <5.0)
Triglycerides: 71 mg/dL (ref <150)

## 2019-06-14 LAB — T4, FREE: Free T4: 1.4 ng/dL (ref 0.8–1.8)

## 2019-06-14 LAB — TSH: TSH: 0.39 mIU/L — ABNORMAL LOW

## 2019-06-18 LAB — CYTOLOGY, (ORAL, ANAL, URETHRAL) ANCILLARY ONLY
Bacterial Vaginitis (gardnerella): NEGATIVE
Candida Glabrata: NEGATIVE
Candida Vaginitis: NEGATIVE
Chlamydia: NEGATIVE
Neisseria Gonorrhea: NEGATIVE
Trichomonas: NEGATIVE

## 2019-11-26 ENCOUNTER — Encounter: Payer: Self-pay | Admitting: Family Medicine

## 2020-01-25 IMAGING — CT CT NECK W/ CM
2 of 3 series · 8 of 14 positions shown, 9 images · IV contrast (iopamidol)
Comparison: None.

CLINICAL DATA: Right-sided neck tenderness since [REDACTED].

EXAM:
CT NECK WITH CONTRAST
TECHNIQUE: Multidetector CT imaging of the neck was performed using the
standard protocol following the bolus administration of intravenous
contrast.
CONTRAST:  75mL AYDTAG-ELL IOPAMIDOL (AYDTAG-ELL) INJECTION 61%

[Series 3: neck · axial · 0.48mm/px · z∈[-250,-112]mm · 4 of 116 slices shown]
[im 24/116  bone]
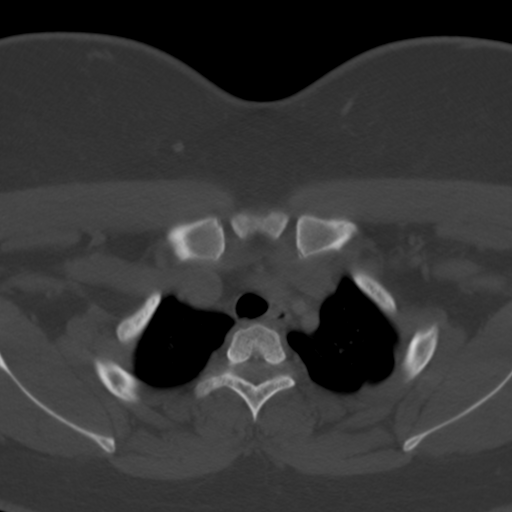
[im 47/116  bone]
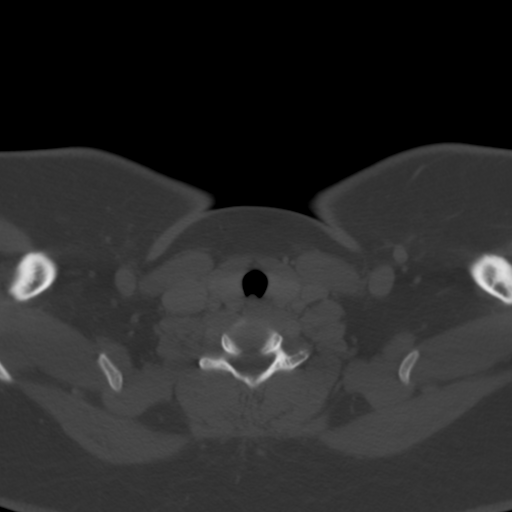
[im 70/116  bone]
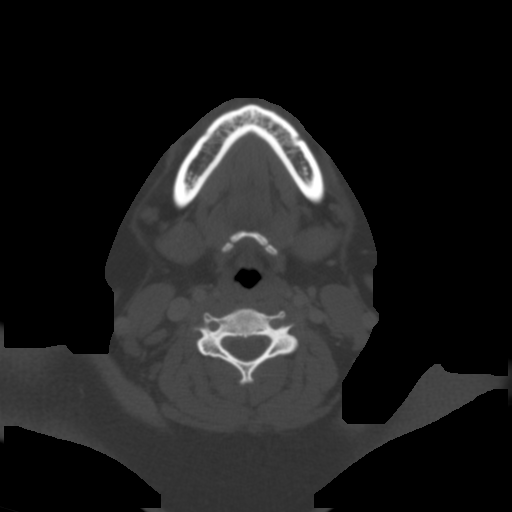
[im 93/116  bone]
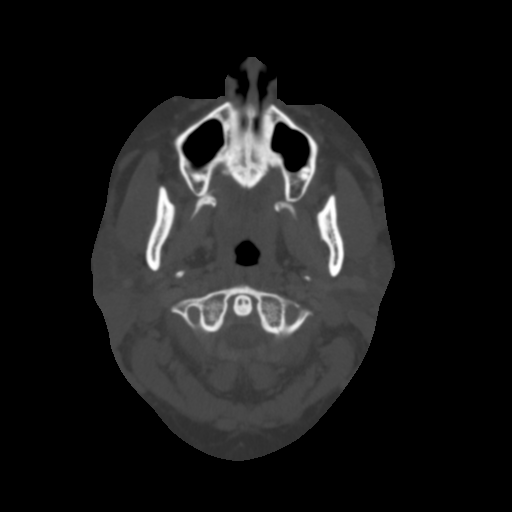

[Series 8: angled axial-oropharynx · axial · 0.39mm/px · z∈[-264,-126]mm · 4 of 116 slices shown, 5 images]
[im 24/116  soft-tissue]
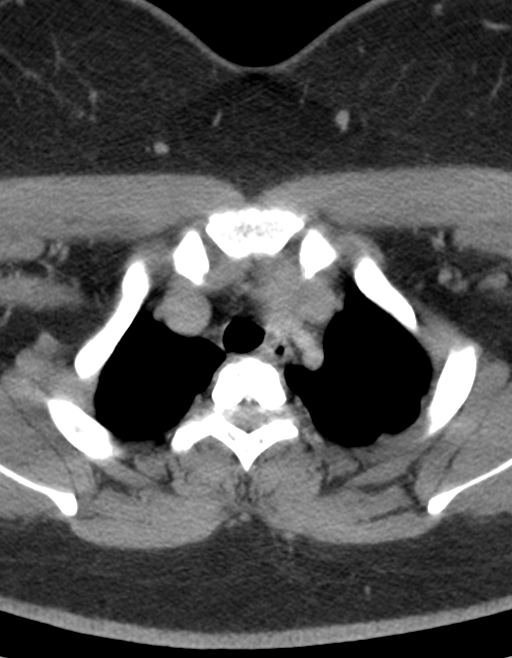
[im 24/116  bone]
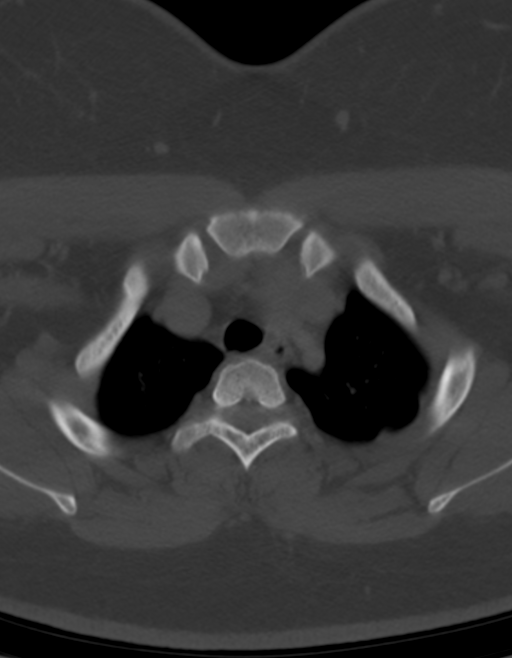
[im 47/116  bone]
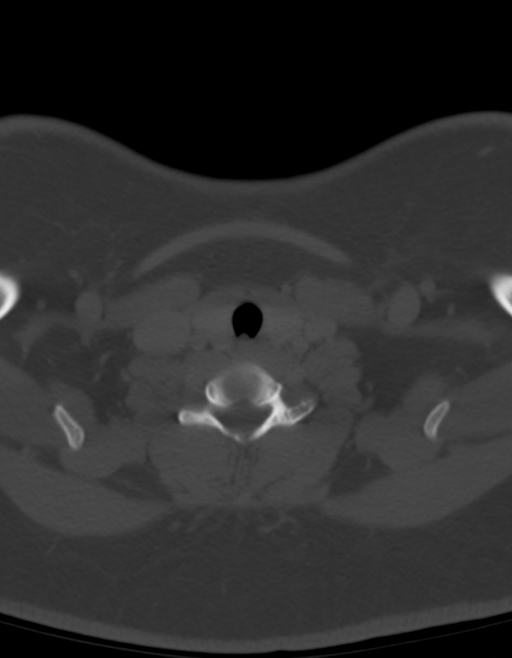
[im 70/116  bone]
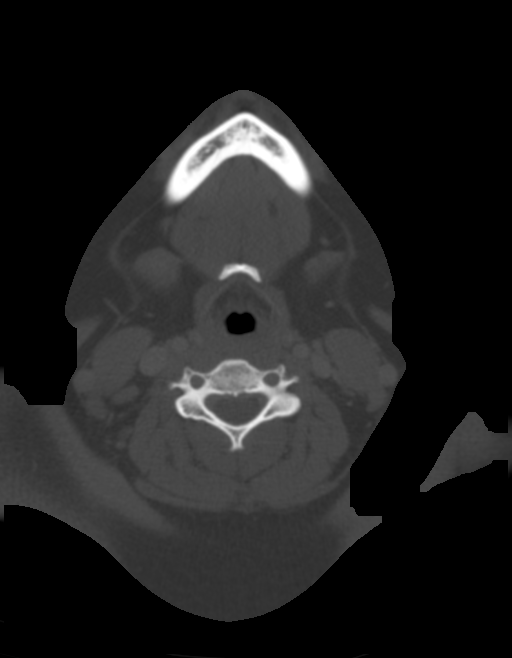
[im 93/116  bone]
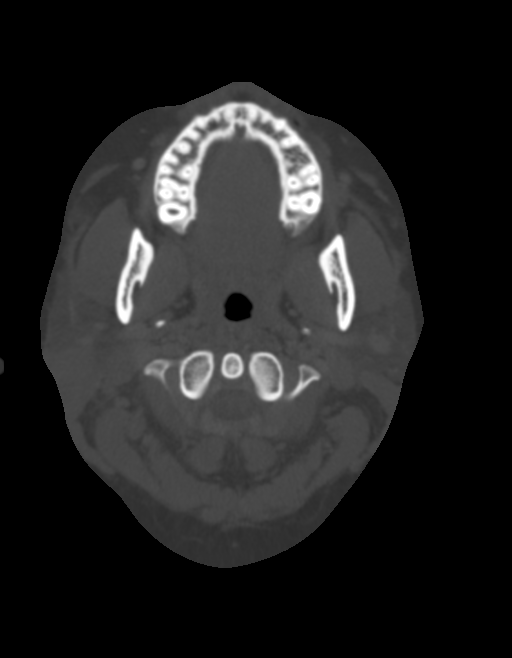

[8 of 14 positions shown; findings below may reference images not displayed]

FINDINGS: Pharynx and larynx: No evidence of mass or swelling. No thickening
of Waldeyer's ring.

Salivary glands: 2 nodules in the right parotid are likely enlarged
lymph nodes at the level of the tail and preauricular station.

Thyroid: Normal

Lymph nodes: Enlarged non cavitated lymph nodes in the right
preauricular station, presumably in the right parotid tail, and in
the upper right jugular chain. No cavitation is seen. There is an
ill-defined low-density in the subcutaneous space over the right
parotid tail measuring 12 mm, with regional fat stranding.

Vascular: Negative

Limited intracranial: Negative

Visualized orbits: Negative

Mastoids and visualized paranasal sinuses: Clear

Skeleton: Negative

Upper chest: Clear
IMPRESSION: Adenopathy in the upper right jugular chain and parotid/preauricular
stations. Given there is a area of mild cutaneous inflammation
overlying the right parotid, hopefully this is reactive adenitis. If
persistent with antibiotics, consider atypical pathogen or alternate
diagnosis such as lymphoma.

## 2020-06-11 ENCOUNTER — Encounter: Payer: BLUE CROSS/BLUE SHIELD | Admitting: Family Medicine

## 2020-08-15 ENCOUNTER — Telehealth: Payer: Self-pay

## 2020-08-15 NOTE — Telephone Encounter (Signed)
Can cause mood changes on initiation but with continuation shouldn't.  I.E. she tolerated her first nexplanon well then the secon one shouldn't cause any issues and it shouldn't all of a sudden cause issues at the 2 year mark.  So it is more likely to just be depression and I would see PCP or psychiatry for that.  However, if she wants to take it out she can make a visit for that although I do not anticipate an improvement in moods with removal

## 2020-08-15 NOTE — Telephone Encounter (Signed)
Pt calling; has questions about the side effects and effects of prolonged use of nexplanon; has her second one in that is due to come out in 04/2021; does it cause depressive moods?  If so, potentially wants removed.  [phone]

## 2020-08-15 NOTE — Telephone Encounter (Signed)
Pt aware but still wants to have nexplanon removed; not sure we are in network for her ins.  Tx'd to [facility] for out of pocket pricing for removal.

## 2021-04-17 ENCOUNTER — Other Ambulatory Visit: Payer: Self-pay

## 2021-04-17 ENCOUNTER — Ambulatory Visit
Admission: RE | Admit: 2021-04-17 | Discharge: 2021-04-17 | Disposition: A | Discharge status: Home / Self Care | Payer: 59 | Source: Ambulatory Visit | Attending: Emergency Medicine | Admitting: Emergency Medicine

## 2021-04-17 VITALS — BP 149/93 | HR 90 | Temp 98.9°F | Resp 16

## 2021-04-17 DIAGNOSIS — N760 Acute vaginitis: Secondary | ICD-10-CM | POA: Diagnosis not present

## 2021-04-17 LAB — POCT URINALYSIS DIP (MANUAL ENTRY)
Bilirubin, UA: NEGATIVE
Glucose, UA: NEGATIVE mg/dL
Ketones, POC UA: NEGATIVE mg/dL
Nitrite, UA: NEGATIVE
Protein Ur, POC: NEGATIVE mg/dL
Spec Grav, UA: 1.015 (ref 1.010–1.025)
Urobilinogen, UA: 0.2 E.U./dL
pH, UA: 8 (ref 5.0–8.0)

## 2021-04-17 LAB — POCT URINE PREGNANCY: Preg Test, Ur: NEGATIVE

## 2021-04-17 MED ORDER — METRONIDAZOLE 0.75 % VA GEL: 1.0000 | Freq: Every day | VAGINAL | 0 refills | Status: AC

## 2021-04-17 NOTE — ED Triage Notes (Signed)
Patient presents to Urgent Care with complaints of vaginal itching since yesterday, increased urgency x 2 days ago. Has a hx of BV. Pt states she self-treats with monistat and flagyl. She is out of meds. so has not treated. Pt requesting STD testing.  Denies fever, hematuria, or abdominal pain.

## 2021-04-17 NOTE — Discharge Instructions (Addendum)
Urine culture and swab pending Continue MetroGel use at bedtime for an additional 2 to 3 days We will call with results and change medicines if needed based off your results

## 2021-04-18 NOTE — ED Provider Notes (Signed)
[facility]    CSN: [id number] Arrival date & time: 04/17/21  1120      History   Chief Complaint Chief Complaint  Patient presents with   SEXUALLY TRANSMITTED DISEASE   Appointment    1130    HPI Monique Jackson is a 25 y.o. female presenting today for evaluation of vaginal itching and urinary urgency.  Patient reports that she has had vaginal itching.  Started her menstrual cycle today with some spotting.  She also has noticed urinary urgency and pressure which subsides after couple of minutes.  She does have history of BV, has used over-the-counter Monistat and had leftover MetroGel and took 3 days of this.  Feels overall symptoms were improving with the MetroGel.  Would like to have general STD screening as well.  HPI  Past Medical History:  Diagnosis Date   Anxiety    HSV-2 (herpes simplex virus 2) infection    Morbid obesity (HCC) 10/17/2017   Recurrent boils     Patient Active Problem List   Diagnosis Date Noted   Mixed hyperlipidemia 06/12/2019   Prediabetes 06/12/2019   High risk heterosexual behavior 06/11/2019   Hidradenitis suppurativa 06/11/2019   Obesity (BMI 35.0-39.9 without comorbidity) 06/06/2018   Family history of thyroid disorder 10/17/2017    History reviewed. No pertinent surgical history.  OB History     Gravida  0   Para  0   Term  0   Preterm  0   AB  0   Living  0      SAB  0   IAB  0   Ectopic  0   Multiple  0   Live Births  0            Home Medications    Prior to Admission medications   Medication Sig Start Date End Date Taking? Authorizing Provider  metroNIDAZOLE (METROGEL VAGINAL) 0.75 % vaginal gel Place 1 Applicatorful vaginally at bedtime for 5 days. 04/17/21 04/22/21 Yes ,  C, PA-C  etonogestrel (NEXPLANON) 68 MG IMPL implant 1 each by Subdermal route once.    [provider]  norethindrone (MICRONOR) 0.35 MG tablet Take 1 tablet by mouth daily. 11/10/20   [provider]    Family History Family History  Problem Relation Age of Onset   Pancreatic cancer Paternal Grandmother    Hypertension Mother    Thyroid disease Mother    Diabetes Father    Hypertension Maternal Grandmother    Gout Maternal Grandmother     Social History Social History   Tobacco Use   Smoking status: Never   Smokeless tobacco: Never  Vaping Use   Vaping Use: Never used  Substance Use Topics   Alcohol use: Yes    Comment: ocassionally   Drug use: Yes    Types: Marijuana     Allergies   Patient has no known allergies.   Review of Systems Review of Systems  Constitutional:  Negative for fever.  Respiratory:  Negative for shortness of breath.   Cardiovascular:  Negative for chest pain.  Gastrointestinal:  Negative for abdominal pain, diarrhea, nausea and vomiting.  Genitourinary:  Positive for vaginal discharge. Negative for dysuria, flank pain, genital sores, hematuria, menstrual problem, vaginal bleeding and vaginal pain.  Musculoskeletal:  Negative for back pain.  Skin:  Negative for rash.  Neurological:  Negative for dizziness, light-headedness and headaches.    Physical Exam Triage Vital Signs ED Triage Vitals  Enc Vitals Group  BP 04/17/21 1155 (S) (!) 149/93     Pulse Rate 04/17/21 1155 90     Resp 04/17/21 1155 16     Temp 04/17/21 1155 98.9 F (37.2 C)     Temp Source 04/17/21 1155 Oral     SpO2 04/17/21 1155 98 %     Weight --      Height --      Head Circumference --      Peak Flow --      Pain Score 04/17/21 1151 0     Pain Loc --      Pain Edu? --      Excl. in GC? --    No data found.  Updated Vital Signs BP (S) (!) 149/93 (BP Location: Left Arm)   Pulse 90   Temp 98.9 F (37.2 C) (Oral)   Resp 16   LMP 03/03/2021   SpO2 98%   Visual Acuity Right Eye Distance:   Left Eye Distance:   Bilateral Distance:    Right Eye Near:   Left Eye Near:    Bilateral Near:     Physical Exam Vitals and nursing note  reviewed.  Constitutional:      Appearance: She is well-developed.     Comments: No acute distress  HENT:     Head: Normocephalic and atraumatic.     Nose: Nose normal.  Eyes:     Conjunctiva/sclera: Conjunctivae normal.  Cardiovascular:     Rate and Rhythm: Normal rate.  Pulmonary:     Effort: Pulmonary effort is normal. No respiratory distress.  Abdominal:     General: There is no distension.  Musculoskeletal:        General: Normal range of motion.     Cervical back: Neck supple.  Skin:    General: Skin is warm and dry.  Neurological:     Mental Status: She is alert and oriented to person, place, and time.     UC Treatments / Results  Labs (all labs ordered are listed, but only abnormal results are displayed) Labs Reviewed  POCT URINALYSIS DIP (MANUAL ENTRY) - Abnormal; Notable for the following components:      Result Value   Clarity, UA cloudy (*)    Blood, UA large (*)    Leukocytes, UA Small (1+) (*)    All other components within normal limits  URINE CULTURE  POCT URINE PREGNANCY  CERVICOVAGINAL ANCILLARY ONLY    EKG   Radiology No results found.  Procedures Procedures (including critical care time)  Medications Ordered in UC Medications - No data to display  Initial Impression / Assessment and Plan / UC Course  I have reviewed the triage vital signs and the nursing notes.  Pertinent labs & imaging results that were available during my care of the patient were reviewed by me and considered in my medical decision making (see chart for details).     Small leuks on UA, will send for urine culture to further evaluate for UTI, will empirically complete course of MetroGel for BV and call with results of swab/urine culture and alter therapy as needed.  Discussed strict return precautions. Patient verbalized understanding and is agreeable with plan.  Final Clinical Impressions(s) / UC Diagnoses   Final diagnoses:  Vaginitis and vulvovaginitis      Discharge Instructions      Urine culture and swab pending Continue MetroGel use at bedtime for an additional 2 to 3 days We will call with results and change medicines  if needed based off your results     ED Prescriptions     Medication Sig Dispense Auth. Provider   metroNIDAZOLE (METROGEL VAGINAL) 0.75 % vaginal gel Place 1 Applicatorful vaginally at bedtime for 5 days. 70 g , [place] C, PA-C      PDMP not reviewed this encounter.   Lew Dawes, PA-C 04/18/21 1040

## 2021-04-19 LAB — URINE CULTURE: Culture: >=100000 — AB

## 2021-04-20 ENCOUNTER — Telehealth: Payer: Self-pay | Admitting: Emergency Medicine

## 2021-04-20 LAB — CERVICOVAGINAL ANCILLARY ONLY
Bacterial Vaginitis (gardnerella): NEGATIVE
Candida Glabrata: NEGATIVE
Candida Vaginitis: POSITIVE — AB
Chlamydia: POSITIVE — AB
Comment: NEGATIVE
Comment: NEGATIVE
Comment: NEGATIVE
Comment: NEGATIVE
Comment: NEGATIVE
Comment: NORMAL
Neisseria Gonorrhea: NEGATIVE
Trichomonas: NEGATIVE

## 2021-04-20 MED ORDER — FLUCONAZOLE 150 MG PO TABS: 150.0000 mg | ORAL_TABLET | Freq: Every day | ORAL | 0 refills | Status: AC

## 2021-04-20 MED ORDER — DOXYCYCLINE HYCLATE 100 MG PO CAPS: 100.0000 mg | ORAL_CAPSULE | Freq: Two times a day (BID) | ORAL | 0 refills | Status: AC

## 2021-04-20 MED ORDER — SULFAMETHOXAZOLE-TRIMETHOPRIM 800-160 MG PO TABS: 1.0000 | ORAL_TABLET | Freq: Two times a day (BID) | ORAL | 0 refills | Status: AC

## 2021-04-20 NOTE — Telephone Encounter (Signed)
Urine culture positive, sending bactrim x 3 days

## 2021-05-25 ENCOUNTER — Ambulatory Visit: Payer: 59 | Admitting: Obstetrics and Gynecology

## 2021-06-30 ENCOUNTER — Encounter: Payer: Self-pay | Admitting: Obstetrics and Gynecology

## 2021-06-30 ENCOUNTER — Other Ambulatory Visit (HOSPITAL_COMMUNITY)
Admission: RE | Admit: 2021-06-30 | Discharge: 2021-06-30 | Disposition: A | Discharge status: Home / Self Care | Payer: 59 | Source: Ambulatory Visit | Attending: Obstetrics and Gynecology | Admitting: Obstetrics and Gynecology

## 2021-06-30 ENCOUNTER — Other Ambulatory Visit: Payer: Self-pay

## 2021-06-30 ENCOUNTER — Ambulatory Visit (INDEPENDENT_AMBULATORY_CARE_PROVIDER_SITE_OTHER): Payer: 59 | Admitting: Obstetrics and Gynecology

## 2021-06-30 VITALS — BP 122/74 | HR 76 | Ht 65.0 in | Wt 250.0 lb

## 2021-06-30 DIAGNOSIS — Z124 Encounter for screening for malignant neoplasm of cervix: Secondary | ICD-10-CM | POA: Diagnosis present

## 2021-06-30 DIAGNOSIS — Z6841 Body Mass Index (BMI) 40.0 and over, adult: Secondary | ICD-10-CM | POA: Diagnosis not present

## 2021-06-30 DIAGNOSIS — R635 Abnormal weight gain: Secondary | ICD-10-CM | POA: Diagnosis not present

## 2021-06-30 DIAGNOSIS — Z01419 Encounter for gynecological examination (general) (routine) without abnormal findings: Secondary | ICD-10-CM

## 2021-06-30 DIAGNOSIS — Z113 Encounter for screening for infections with a predominantly sexual mode of transmission: Secondary | ICD-10-CM | POA: Diagnosis not present

## 2021-06-30 DIAGNOSIS — Z3169 Encounter for other general counseling and advice on procreation: Secondary | ICD-10-CM | POA: Diagnosis not present

## 2021-06-30 MED ORDER — PHENTERMINE HCL 37.5 MG PO CAPS: 37.5000 mg | ORAL_CAPSULE | ORAL | 0 refills | Status: DC

## 2021-06-30 NOTE — Patient Instructions (Signed)

## 2021-06-30 NOTE — Progress Notes (Signed)
Gynecology Annual Exam  PCP: Pcp, No  Chief Complaint:  Chief Complaint  Patient presents with   Gynecologic Exam    Annual - discuss weight loss. RM 4    History of Present Illness: Patient is a 25 y.o. G0P0000 presents for annual exam. The patient has no complaints today.   LMP: Patient's last menstrual period was 06/14/2021. Average Interval: regular, 30 days Duration of flow: 5 days Heavy Menses: no Clots: no Intermenstrual Bleeding: no Postcoital Bleeding: no Dysmenorrhea: no  The patient is sexually active. She currently uses none for contraception. She denies dyspareunia.  The patient does perform self breast exams.  There is no notable family history of breast or ovarian cancer in her family.  The patient wears seatbelts: yes.    The patient denies current symptoms of depression.     Patientis a 25 y.o. G0P0000 female, who presents for the evaluation of weight gain. She has gained 22 pounds primarily over  2 year . The patient states the following issues have contributed to her weight problem: lifestyle, was also on nexplanon.  The patient has no additional symptoms. The patient specifically denies memory loss, muscle weakness, excessive thirst, and polyuria. Weight related co-morbidities include none. The patient's past medical history is notable for none. Would like to optimize weight prior to pregnancy.    Review of Systems: Review of Systems  Constitutional:  Negative for chills and fever.  HENT:  Negative for congestion.   Respiratory:  Negative for cough and shortness of breath.   Cardiovascular:  Negative for chest pain and palpitations.  Gastrointestinal:  Negative for abdominal pain, constipation, diarrhea, heartburn, nausea and vomiting.  Genitourinary:  Negative for dysuria, frequency and urgency.  Skin:  Negative for itching and rash.  Neurological:  Negative for dizziness and headaches.  Endo/Heme/Allergies:  Negative for polydipsia.   Psychiatric/Behavioral:  Negative for depression.    Past Medical History:  Patient Active Problem List   Diagnosis Date Noted   Mixed hyperlipidemia 06/12/2019   Prediabetes 06/12/2019   High risk heterosexual behavior 06/11/2019   Hidradenitis suppurativa 06/11/2019   Obesity (BMI 35.0-39.9 without comorbidity) 06/06/2018   Family history of thyroid disorder 10/17/2017    Past Surgical History:  No past surgical history on file.  Gynecologic History:  Patient's last menstrual period was 06/14/2021. Contraception: 05/02/2018 Nexplanon has been removed Last Pap: Results were:  2/12/2020ASCUS with NEGATIVE high risk HPV  10/17/2018 LGSIL  Obstetric History: G0P0000  Family History:  Family History  Problem Relation Age of Onset   Pancreatic cancer Paternal Grandmother    Hypertension Mother    Thyroid disease Mother    Diabetes Father    Hypertension Maternal Grandmother    Gout Maternal Grandmother     Social History:  Social History   Socioeconomic History   Marital status: Single    Spouse name: Not on file   Number of children: Not on file   Years of education: Not on file   Highest education level: Not on file  Occupational History   Not on file  Tobacco Use   Smoking status: Never   Smokeless tobacco: Never  Vaping Use   Vaping Use: Never used  Substance and Sexual Activity   Alcohol use: Yes    Comment: ocassionally   Drug use: Yes    Types: Marijuana   Sexual activity: Yes    Partners: Male    Birth control/protection: Condom  Other Topics Concern   Not on  file  Social History Narrative   ** Merged History Encounter **       Social Determinants of Health   Financial Resource Strain: Not on file  Food Insecurity: Not on file  Transportation Needs: Not on file  Physical Activity: Not on file  Stress: Not on file  Social Connections: Not on file  Intimate Partner Violence: Not on file    Allergies:  No Known  Allergies  Medications: Prior to Admission medications   Medication Sig Start Date End Date Taking? Authorizing Provider  etonogestrel (NEXPLANON) 68 MG IMPL implant 1 each by Subdermal route once.    [provider]  norethindrone (MICRONOR) 0.35 MG tablet Take 1 tablet by mouth daily. 11/10/20   [provider]    Physical Exam Vitals: Blood pressure 122/74, pulse 76, height 5\' 5"  (1.651 m), weight 250 lb (113.4 kg), last menstrual period 06/14/2021. Body mass index is 41.6 kg/m.  General: NAD HEENT: normocephalic, anicteric Thyroid: no enlargement, no palpable nodules Pulmonary: No increased work of breathing, CTAB Cardiovascular: RRR, distal pulses 2+ Breast: Breast symmetrical, no tenderness, no palpable nodules or masses, no skin or nipple retraction present, no nipple discharge.  No axillary or supraclavicular lymphadenopathy. Abdomen: NABS, soft, non-tender, non-distended.  Umbilicus without lesions.  No hepatomegaly, splenomegaly or masses palpable. No evidence of hernia  Genitourinary:  External: Normal external female genitalia.  Normal urethral meatus, normal Bartholin's and Skene's glands.    Vagina: Normal vaginal mucosa, no evidence of prolapse.    Cervix: Grossly normal in appearance, no bleeding  Uterus: Non-enlarged, mobile, normal contour.  No CMT  Adnexa: ovaries non-enlarged, no adnexal masses  Rectal: deferred  Lymphatic: no evidence of inguinal lymphadenopathy Extremities: no edema, erythema, or tenderness Neurologic: Grossly intact Psychiatric: mood appropriate, affect full  Female chaperone present for pelvic and breast  portions of the physical exam  Immunization History  Administered Date(s) Administered   Tdap 09/06/2012     Assessment: 25 y.o. G0P0000 routine annual exam  Plan: Problem List Items Addressed This Visit   None Visit Diagnoses     Encounter for gynecological examination without abnormal finding    -  Primary    Weight gain       Relevant Orders   TSH (Completed)   Hemoglobin A1c (Completed)   Class 3 severe obesity without serious comorbidity with body mass index (BMI) of 40.0 to 44.9 in adult, unspecified obesity type (HCC)       Relevant Medications   phentermine 37.5 MG capsule   Other Relevant Orders   TSH (Completed)   Hemoglobin A1c (Completed)   Encounter for preconception consultation       Relevant Orders   TSH (Completed)   Hemoglobin A1c (Completed)   Routine screening for STI (sexually transmitted infection)       Relevant Orders   Cytology - PAP (Completed)   HEP, RPR, HIV Panel (Completed)   Screening for malignant neoplasm of cervix       Relevant Orders   Cytology - PAP (Completed)      Annual  1) 4) Gardasil Series discussed and if applicable offered to patient - Patient has previously completed 3 shot series   2) STI screening  wasoffered and accepted  3)  ASCCP guidelines and rational discussed.  Patient opts for every 3 years screening interval  4) Contraception - the patient is currently using  none.  She is attempting to conceive in the near future We discussed safe sex practices  to reduce her furture risk of STI's.    5) Return in about 4 weeks (around 07/28/2021) for medication follow up phone.  Obesity  1) 1500 Calorie ADA Diet  2) Patient education given regarding appropriate lifestyle changes for weight loss including: regular physical activity, healthy coping strategies, caloric restriction and healthy eating patterns.  3) Patient will be started on weight loss medication. The risks and benefits and side effects of medication, such as Adipex (Phenteramine) ,  Tenuate (Diethylproprion), Contrave (buproprion/naltrexone), Qsymia (phentermine/topiramate), and Saxenda (liraglutide) is discussed. The pros and cons of suppressing appetite and boosting metabolism is discussed. Risks of tolerence and addiction is discussed for selected agents discussed. Use of  medicine will ne short term, such as 3-4 months at a time followed by a period of time off of the medicine to avoid these risks and side effects for Adipex, Qsymia, and Tenuate discussed. Pt to call with any negative side effects and agrees to keep follow up appts.  4) Comorbidity Screening - hypothyroidism screening, diabetes, and hyperlipidemia screening offered  5) Encouraged weekly weight monitorig to track progress and sample 1 week food diary  6) 15 minutes face-to-face; counseling/coordination of care > 50 percent of visit  7) Follow up in 4 weeks to assess response  Vena Austria, MD, [facility], [facility] 06/30/2021, 9:46 AM

## 2021-07-01 LAB — HEMOGLOBIN A1C
Est. average glucose Bld gHb Est-mCnc: 140 mg/dL
Hgb A1c MFr Bld: 6.5 % — ABNORMAL HIGH (ref 4.8–5.6)

## 2021-07-01 LAB — HEP, RPR, HIV PANEL
HIV Screen 4th Generation wRfx: NONREACTIVE
Hepatitis B Surface Ag: NEGATIVE
RPR Ser Ql: NONREACTIVE

## 2021-07-01 LAB — TSH: TSH: 1.21 u[IU]/mL (ref 0.450–4.500)

## 2021-07-02 LAB — CYTOLOGY - PAP
Adequacy: ABSENT
Chlamydia: NEGATIVE
Comment: NEGATIVE
Comment: NEGATIVE
Comment: NORMAL
Diagnosis: NEGATIVE
Neisseria Gonorrhea: NEGATIVE
Trichomonas: NEGATIVE

## 2021-07-14 ENCOUNTER — Other Ambulatory Visit: Payer: Self-pay

## 2021-07-14 ENCOUNTER — Ambulatory Visit (INDEPENDENT_AMBULATORY_CARE_PROVIDER_SITE_OTHER): Payer: 59 | Admitting: Family Medicine

## 2021-07-14 ENCOUNTER — Encounter: Payer: Self-pay | Admitting: Family Medicine

## 2021-07-14 VITALS — BP 126/80 | HR 105 | Temp 97.3°F | Ht 64.25 in | Wt 245.2 lb

## 2021-07-14 DIAGNOSIS — F1290 Cannabis use, unspecified, uncomplicated: Secondary | ICD-10-CM | POA: Insufficient documentation

## 2021-07-14 DIAGNOSIS — R7303 Prediabetes: Secondary | ICD-10-CM | POA: Diagnosis not present

## 2021-07-14 DIAGNOSIS — Z6841 Body Mass Index (BMI) 40.0 and over, adult: Secondary | ICD-10-CM | POA: Diagnosis not present

## 2021-07-14 DIAGNOSIS — E782 Mixed hyperlipidemia: Secondary | ICD-10-CM | POA: Diagnosis not present

## 2021-07-14 DIAGNOSIS — L732 Hidradenitis suppurativa: Secondary | ICD-10-CM | POA: Diagnosis not present

## 2021-07-14 NOTE — Progress Notes (Signed)
[facility] [address] [place] [address] Dept: [phone] Dept Fax: [phone]  New Patient Office Visit  Subjective:    Patient ID: Monique Jackson, female    DOB: April 09, 1996, 25 y.o..   MRN: [id number]  Chief Complaint  Patient presents with   Establish Care    NP- establish care.  No concerns.  Declines flu shot.   Wants RX for HSV outbreaks.      History of Present Illness:  Patient is in today to establish care. Monique Jackson was born in [place], [place]. She attended college at [employer]. 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 Monique Jackson is single (has a cat and a dog). She has no children. She admits to multiple sexual partners and describes her efforts at keeping herself safe from STDs. She also notes she had stopped using birth control due to potential side effects. Monique Jackson does not smoke tobacco and rarely drinks alcohol. However, she uses marijuana daily.   Monique Jackson has a history of hidradenitis suppurativa. She notes that she ahs been treated for this in the past with PRN doxycycline 100 mg BID when she has outbreaks of boils in her axillae or groin area. She has not had doxycycline available for the past 2 months.  Monique Jackson notes concern over her weight. She sees Dr. Bonney Aid (GYN) who is now prescribing her phentermine. She notes that her weight was 250 lbs prior to starting the medication 2 weeks ago. She feels her maximum weight may have been around 260-270 lbs while in graduate school. She notes efforts at dietary changes and increased physical activity (walking 1-2 miles twice a week) that were not leading to weight loss.  Past Medical History: Patient Active Problem List   Diagnosis Date Noted   Mixed hyperlipidemia 06/12/2019   Prediabetes  06/12/2019   Hidradenitis suppurativa 06/11/2019   BMI 40.0-44.9, adult (HCC) 06/06/2018   History reviewed. No pertinent surgical history.  Family History  Problem Relation Age of Onset   Heart disease Mother    Stroke Mother    Diabetes Mother    Hypertension Mother    Thyroid disease Mother    Diabetes Father    Hypertension Maternal Grandmother    Gout Maternal Grandmother    Cancer Paternal Grandmother        Pancreatic   Pancreatic cancer Paternal Grandmother    Outpatient Medications Prior to Visit  Medication Sig Dispense Refill   phentermine 37.5 MG capsule Take 1 capsule (37.5 mg total) by mouth every morning. 30 capsule 0   ibuprofen (ADVIL) 800 MG tablet Take 800 mg by mouth every 6 (six) hours.     norethindrone (MICRONOR) 0.35 MG tablet Take 1 tablet by mouth daily. (Patient not taking: Reported on 07/14/2021)     No facility-administered medications prior to visit.   No Known Allergies    Objective:   Today's Vitals   07/14/21 1036  BP: 126/80  Pulse: (!) 105  Temp: (!) 97.3 F (36.3 C)  TempSrc: Temporal  SpO2: 95%  Weight: 245 lb 3.2 oz (111.2 kg)  Height: 5' 4.25" (1.632 m)   Body mass index is 41.76 kg/m.   General: Well developed, well nourished. No acute distress. Psych: Alert and oriented. Normal mood and affect.  Health Maintenance Due  Topic Date Due   HPV VACCINES (1 -  2-dose series) Never done   Hepatitis C Screening  Never done   Lab Results Lab Results  Component Value Date   HGBA1C 6.5 (H) 06/30/2021   Lab Results  Component Value Date   TSH 1.210 06/30/2021   Lab Results  Component Value Date   CHOL 212 (H) 06/11/2019   HDL 50 06/11/2019   LDLCALC 145 (H) 06/11/2019   TRIG 71 06/11/2019   CHOLHDL 4.2 06/11/2019   Assessment & Plan:   1. Hidradenitis suppurativa Certainly doxycycline may be a reasonable approach to treating flares of this condition, I would be concerned about possible antibiotic resistance  developing. I prefer to refer her to dermatology to consider if this is the best approach.  - Ambulatory referral to Dermatology  2. BMI 40.0-44.9, adult (HCC) By history, Monique Jackson has lost about 5 lbs so far. I did point out that her pulse is mildly fast, but her blood pressure seems to be doing well.  3. Mixed hyperlipidemia Monique Jackson was unaware of past elevated lipids. I will repeat these to see where she is currently.  - Lipid panel; Future  4. Prediabetes Monique Jackson was a bit hesitant about my concerns raised regarding her A1c and her report of fasting glucoses int he 140s. She has a strong family history of diabetes. I recommend we repeat these fasting studies in about 1 month to assess if she might have diabetes. If present, a GLP-1 agonist may be a better approach for her weight loss efforts.  - Hemoglobin A1c; Future - Glucose, random; Future  Monique Mast, MD

## 2021-07-28 ENCOUNTER — Ambulatory Visit (INDEPENDENT_AMBULATORY_CARE_PROVIDER_SITE_OTHER): Payer: 59 | Admitting: Obstetrics and Gynecology

## 2021-07-28 ENCOUNTER — Encounter: Payer: Self-pay | Admitting: Obstetrics and Gynecology

## 2021-07-28 ENCOUNTER — Other Ambulatory Visit: Payer: Self-pay

## 2021-07-28 VITALS — Ht 64.0 in | Wt 240.0 lb

## 2021-07-28 DIAGNOSIS — Z6841 Body Mass Index (BMI) 40.0 and over, adult: Secondary | ICD-10-CM | POA: Diagnosis not present

## 2021-07-28 MED ORDER — PHENTERMINE HCL 37.5 MG PO CAPS: 37.5000 mg | ORAL_CAPSULE | ORAL | 0 refills | Status: DC

## 2021-07-28 NOTE — Progress Notes (Signed)
I connected with Monique Jackson on 07/28/2021 at  1:50 PM EST by telephone and verified that I am speaking with the correct person using two identifiers.   I discussed the limitations, risks, security and privacy concerns of performing an evaluation and management service by telephone and the availability of in person appointments. I also discussed with the patient that there may be a patient responsible charge related to this service. The patient expressed understanding and agreed to proceed.  The patient was at home I spoke with the patient from my workstation phone The names of people involved in this encounter were: Monique Jackson , and Monique Jackson  Gynecology Office Visit  Chief Complaint:  Chief Complaint  Patient presents with   Follow-up    Phone visit - weight loss, have not been taking medication as she should.    History of Present Illness: Patientis a 25 y.o. G0P0000 female, who presents for the evaluation of the desire to lose weight. She has lost 5 pounds 1 months. The patient states the following symptoms since starting her weight loss therapy: appetite suppression, energy, and weight loss.  The patient also reports no other ill effects. The patient specifically denies heart palpitations, anxiety, and insomnia.    Review of Systems: 10 point review of systems negative unless otherwise noted in HPI  Past Medical History:  Past Medical History:  Diagnosis Date   Allergy    Anxiety    HSV-2 (herpes simplex virus 2) infection    Morbid obesity (HCC) 10/17/2017   Recurrent boils     Past Surgical History:  History reviewed. No pertinent surgical history.  Gynecologic History: Patient's last menstrual period was 06/14/2021.  Obstetric History: G0P0000  Family History:  Family History  Problem Relation Age of Onset   Heart disease Mother    Stroke Mother    Diabetes Mother    Hypertension Mother    Thyroid disease Mother    Diabetes Father     Hypertension Maternal Grandmother    Gout Maternal Grandmother    Cancer Paternal Grandmother        Pancreatic   Pancreatic cancer Paternal Grandmother     Social History:  Social History   Socioeconomic History   Marital status: Single    Spouse name: Not on file   Number of children: 0   Years of education: Not on file   Highest education level: Not on file  Occupational History   Occupation: Substance abuse counselor    Comment: Teacher, English as a foreign language  Tobacco Use   Smoking status: Never   Smokeless tobacco: Never  Vaping Use   Vaping Use: Former  Substance and Sexual Activity   Alcohol use: Yes    Comment: ocassionally   Drug use: Yes    Types: Marijuana    Comment: 1/2 gram daily   Sexual activity: Yes    Partners: Male    Birth control/protection: Condom  Other Topics Concern   Not on file  Social History Narrative   Not on file   Social Determinants of Health   Financial Resource Strain: Not on file  Food Insecurity: Not on file  Transportation Needs: Not on file  Physical Activity: Not on file  Stress: Not on file  Social Connections: Not on file  Intimate Partner Violence: Not on file    Allergies:  No Known Allergies  Medications: Prior to Admission medications   Medication Sig Start Date End Date Taking? Authorizing Provider  phentermine 37.5  MG capsule Take 1 capsule (37.5 mg total) by mouth every morning. 07/28/21   Monique Austria, MD    Physical Exam Height 5\' 4"  (1.626 m), weight 240 lb (108.9 kg), last menstrual period 06/14/2021. Wt Readings from Last 3 Encounters:  07/28/21 240 lb (108.9 kg)  07/14/21 245 lb 3.2 oz (111.2 kg)  06/30/21 250 lb (113.4 kg)   No physical exam as this was a remote telephone visit to promote social distancing during the current COVID-19 Pandemic  Assessment: 25 y.o. G0P0000 \\medication  follow up   Plan: Problem List Items Addressed This Visit   None   1) 1500 Calorie ADA Diet  2)  Patient education given regarding appropriate lifestyle changes for weight loss including: regular physical activity, healthy coping strategies, caloric restriction and healthy eating patterns.  3) Patient will be started on weight loss medication. The risks and benefits and side effects of medication, such as Adipex (Phenteramine) ,  Tenuate (Diethylproprion), Belviq (lorcarsin), Contrave (buproprion/naltrexone), Qsymia (phentermine/topiramate), and Saxenda (liraglutide) is discussed. The pros and cons of suppressing appetite and boosting metabolism is discussed. Risks of tolerence and addiction is discussed for selected agents discussed. Use of medicine will ne short term, such as 3-4 months at a time followed by a period of time off of the medicine to avoid these risks and side effects for Adipex, Qsymia, and Tenuate discussed. Pt to call with any negative side effects and agrees to keep follow up appts.  4) Patient to take medication, with the benefits of appetite suppression and metabolism boost d/w pt, along with the side effects and risk factors of long term use that will be avoided with our use of short bursts of therapy. Rx provided.   - Rx phentermine  5) Telephone Time 10:29min  6) Return in about 4 weeks (around 08/25/2021) for medication follow up (phone).    08/27/2021, MD, Monique Jackson [facility], [facility] 07/28/2021, 2:25 PM

## 2021-08-11 ENCOUNTER — Other Ambulatory Visit: Payer: 59

## 2021-08-25 ENCOUNTER — Encounter: Payer: Self-pay | Admitting: Obstetrics and Gynecology

## 2021-08-25 ENCOUNTER — Ambulatory Visit: Payer: 59 | Admitting: Family Medicine

## 2021-08-25 ENCOUNTER — Ambulatory Visit (INDEPENDENT_AMBULATORY_CARE_PROVIDER_SITE_OTHER): Payer: 59 | Admitting: Obstetrics and Gynecology

## 2021-08-25 ENCOUNTER — Other Ambulatory Visit: Payer: Self-pay

## 2021-08-25 VITALS — Ht 64.0 in | Wt 231.0 lb

## 2021-08-25 DIAGNOSIS — Z6839 Body mass index (BMI) 39.0-39.9, adult: Secondary | ICD-10-CM | POA: Diagnosis not present

## 2021-08-25 DIAGNOSIS — E8881 Metabolic syndrome: Secondary | ICD-10-CM

## 2021-08-25 MED ORDER — METFORMIN HCL ER 500 MG PO TB24: 500.0000 mg | ORAL_TABLET | Freq: Every day | ORAL | 1 refills | Status: DC

## 2021-08-25 MED ORDER — PHENTERMINE HCL 37.5 MG PO TABS: 37.5000 mg | ORAL_TABLET | Freq: Every day | ORAL | 0 refills | Status: DC

## 2021-08-25 NOTE — Progress Notes (Signed)
I connected with Monique Jackson on 08/25/21 at  8:10 AM EST by telephone and verified that I am speaking with the correct person using two identifiers.   I discussed the limitations, risks, security and privacy concerns of performing an evaluation and management service by telephone and the availability of in person appointments. I also discussed with the patient that there may be a patient responsible charge related to this service. The patient expressed understanding and agreed to proceed.  The patient was at home I spoke with the patient from my workstation phone The names of people involved in this encounter were: Monique Jackson , and Monique Jackson   Gynecology Office Visit  Chief Complaint:  Chief Complaint  Patient presents with   Follow-up    Phone visit - weight loss.    History of Present Illness: Patientis a 25 y.o. G0P0000 female, who presents for the evaluation of the desire to lose weight. She has lost 9 pounds 1 months. The patient states the following symptoms since starting her weight loss therapy: appetite suppression, energy, and weight loss.  The patient also reports no other ill effects. The patient specifically denies heart palpitations, anxiety, and insomnia.    Review of Systems: 10 point review of systems negative unless otherwise noted in HPI  Past Medical History:  Past Medical History:  Diagnosis Date   Allergy    Anxiety    HSV-2 (herpes simplex virus 2) infection    Morbid obesity (HCC) 10/17/2017   Recurrent boils     Past Surgical History:  History reviewed. No pertinent surgical history.  Gynecologic History: No LMP recorded.  Obstetric History: G0P0000  Family History:  Family History  Problem Relation Age of Onset   Heart disease Mother    Stroke Mother    Diabetes Mother    Hypertension Mother    Thyroid disease Mother    Diabetes Father    Hypertension Maternal Grandmother    Gout Maternal Grandmother    Cancer Paternal  Grandmother        Pancreatic   Pancreatic cancer Paternal Grandmother     Social History:  Social History   Socioeconomic History   Marital status: Single    Spouse name: Not on file   Number of children: 0   Years of education: Not on file   Highest education level: Not on file  Occupational History   Occupation: Substance abuse counselor    Comment: Teacher, English as a foreign language  Tobacco Use   Smoking status: Never   Smokeless tobacco: Never  Vaping Use   Vaping Use: Former  Substance and Sexual Activity   Alcohol use: Yes    Comment: ocassionally   Drug use: Yes    Types: Marijuana    Comment: 1/2 gram daily   Sexual activity: Yes    Partners: Male    Birth control/protection: Condom  Other Topics Concern   Not on file  Social History Narrative   Not on file   Social Determinants of Health   Financial Resource Strain: Not on file  Food Insecurity: Not on file  Transportation Needs: Not on file  Physical Activity: Not on file  Stress: Not on file  Social Connections: Not on file  Intimate Partner Violence: Not on file    Allergies:  No Known Allergies  Medications: Prior to Admission medications   Medication Sig Start Date End Date Taking? Authorizing Provider  phentermine 37.5 MG capsule Take 1 capsule (37.5 mg total) by mouth  every morning. 07/28/21   Monique Austria, MD    Physical Exam Height 5\' 4"  (1.626 m), weight 231 lb (104.8 kg). Wt Readings from Last 3 Encounters:  08/25/21 231 lb (104.8 kg)  07/28/21 240 lb (108.9 kg)  07/14/21 245 lb 3.2 oz (111.2 kg)  Body mass index is 39.65 kg/m.  No physical exam as this was a remote telephone visit to promote social distancing during the current COVID-19 Pandemic   Assessment: 25 y.o. G0P0000 medical weight loss   Plan: Problem List Items Addressed This Visit   None Visit Diagnoses     Class 2 severe obesity with serious comorbidity and body mass index (BMI) of 39.0 to 39.9 in adult,  unspecified obesity type (HCC)    -  Primary   Relevant Medications   phentermine (ADIPEX-P) 37.5 MG tablet   metFORMIN (GLUCOPHAGE XR) 500 MG 24 hr tablet       1) 1500 Calorie ADA Diet  2) Patient education given regarding appropriate lifestyle changes for weight loss including: regular physical activity, healthy coping strategies, caloric restriction and healthy eating patterns.  3) Continue phentermine, add metformin for elevated HgbA1C  4) Patient to take medication, with the benefits of appetite suppression and metabolism boost d/w pt, along with the side effects and risk factors of long term use that will be avoided with our use of short bursts of therapy. Rx provided.    5) Telephone time 9:68min  6) Return in about 4 weeks (around 09/22/2021) for medication follow up in person.    09/24/2021, MD, Monique Jackson [facility], [facility] 08/25/2021, 8:40 AM

## 2021-08-28 ENCOUNTER — Encounter: Payer: Self-pay | Admitting: Obstetrics and Gynecology

## 2021-09-01 ENCOUNTER — Encounter: Payer: Self-pay | Admitting: Family Medicine

## 2021-09-01 ENCOUNTER — Ambulatory Visit: Payer: 59 | Admitting: Family Medicine

## 2021-09-01 ENCOUNTER — Other Ambulatory Visit: Payer: Self-pay

## 2021-09-01 ENCOUNTER — Other Ambulatory Visit (INDEPENDENT_AMBULATORY_CARE_PROVIDER_SITE_OTHER): Payer: 59

## 2021-09-01 ENCOUNTER — Telehealth: Payer: Self-pay

## 2021-09-01 DIAGNOSIS — R7303 Prediabetes: Secondary | ICD-10-CM

## 2021-09-01 DIAGNOSIS — E782 Mixed hyperlipidemia: Secondary | ICD-10-CM

## 2021-09-01 LAB — LIPID PANEL
Cholesterol: 222 mg/dL — ABNORMAL HIGH (ref 0–200)
HDL: 47.7 mg/dL (ref >39.00)
LDL Cholesterol: 151 mg/dL — ABNORMAL HIGH (ref 0–99)
NonHDL: 173.95
Total CHOL/HDL Ratio: 5
Triglycerides: 114 mg/dL (ref 0.0–149.0)
VLDL: 22.8 mg/dL (ref 0.0–40.0)

## 2021-09-01 LAB — GLUCOSE, RANDOM: Glucose, Bld: 102 mg/dL — ABNORMAL HIGH (ref 70–99)

## 2021-09-01 LAB — HEMOGLOBIN A1C: Hgb A1c MFr Bld: 6.6 % — ABNORMAL HIGH (ref 4.6–6.5)

## 2021-09-01 NOTE — Telephone Encounter (Signed)
Appointment scheduled 09/02/21 with Oran Rein  Dm/cma

## 2021-09-01 NOTE — Telephone Encounter (Signed)
Pt came in for labs for Dr Veto Kemps. Pt stated she had asked for tests to be added for STDs. We collected urine from pt, drew enough blood to have extra if he ordered blood work and patient swabbed herself.  A message was sent to pcp as we were collecting and no response, he was in a room with a patient. Per Dr Veto Kemps, pt needs an ov for this. Tried to calll patient back to let her know she could see Padonda today, lots of openings. Voicemail box was full.

## 2021-09-02 ENCOUNTER — Other Ambulatory Visit: Payer: Self-pay

## 2021-09-02 ENCOUNTER — Ambulatory Visit (INDEPENDENT_AMBULATORY_CARE_PROVIDER_SITE_OTHER): Payer: 59 | Admitting: Family

## 2021-09-02 ENCOUNTER — Encounter: Payer: Self-pay | Admitting: Family

## 2021-09-02 VITALS — Ht 64.25 in | Wt 245.0 lb

## 2021-09-02 DIAGNOSIS — N898 Other specified noninflammatory disorders of vagina: Secondary | ICD-10-CM | POA: Diagnosis not present

## 2021-09-02 DIAGNOSIS — Z7251 High risk heterosexual behavior: Secondary | ICD-10-CM

## 2021-09-02 DIAGNOSIS — Z113 Encounter for screening for infections with a predominantly sexual mode of transmission: Secondary | ICD-10-CM | POA: Diagnosis not present

## 2021-09-02 NOTE — Progress Notes (Signed)
Acute Office Visit  Subjective:    Patient ID: Monique Jackson, female    DOB: [birth year], 25 y.o.   MRN: [id number]  Chief Complaint  Patient presents with   Acute Visit    Pt would like testing for STDs.     HPI Patient is in today for STI screening. She reports 2 female sexual partners in the last 3 months She normally tests every 3 months. She is requesting to test earlier because her insurance plan will change in January and not sure she will be able to continue to afford every 3 month visits.   Past Medical History:  Diagnosis Date   Allergy    Anxiety    HSV-2 (herpes simplex virus 2) infection    Morbid obesity (HCC) 10/17/2017   Recurrent boils     History reviewed. No pertinent surgical history.  Family History  Problem Relation Age of Onset   Heart disease Mother    Stroke Mother    Diabetes Mother    Hypertension Mother    Thyroid disease Mother    Diabetes Father    Hypertension Maternal Grandmother    Gout Maternal Grandmother    Cancer Paternal Grandmother        Pancreatic   Pancreatic cancer Paternal Grandmother     Social History   Socioeconomic History   Marital status: Single    Spouse name: Not on file   Number of children: 0   Years of education: Not on file   Highest education level: Not on file  Occupational History   Occupation: Substance abuse counselor    Comment: Teacher, English as a foreign language  Tobacco Use   Smoking status: Never   Smokeless tobacco: Never  Vaping Use   Vaping Use: Former  Substance and Sexual Activity   Alcohol use: Yes    Comment: ocassionally   Drug use: Yes    Types: Marijuana    Comment: 1/2 gram daily   Sexual activity: Yes    Partners: Male    Birth control/protection: Condom  Other Topics Concern   Not on file  Social History Narrative   Not on file   Social Determinants of Health   Financial Resource Strain: Not on file  Food Insecurity: Not on file  Transportation Needs: Not on file   Physical Activity: Not on file  Stress: Not on file  Social Connections: Not on file  Intimate Partner Violence: Not on file    Outpatient Medications Prior to Visit  Medication Sig Dispense Refill   metFORMIN (GLUCOPHAGE XR) 500 MG 24 hr tablet Take 1 tablet (500 mg total) by mouth daily with breakfast. 120 tablet 1   phentermine (ADIPEX-P) 37.5 MG tablet Take 1 tablet (37.5 mg total) by mouth daily before breakfast. 30 tablet 0   No facility-administered medications prior to visit.    No Known Allergies  Review of Systems  Constitutional: Negative.   Psychiatric/Behavioral: Negative.    All other systems reviewed and are negative.     Objective:    Physical Exam Vitals and nursing note reviewed.  Constitutional:      Appearance: Normal appearance.  Neurological:     General: No focal deficit present.     Mental Status: She is alert and oriented to person, place, and time.  Psychiatric:        Mood and Affect: Mood normal.        Behavior: Behavior normal.    Ht 5' 4.25" (1.632 m)  Wt 245 lb (111.1 kg)    BMI 41.73 kg/m  Wt Readings from Last 3 Encounters:  09/02/21 245 lb (111.1 kg)  08/25/21 231 lb (104.8 kg)  07/28/21 240 lb (108.9 kg)    Health Maintenance Due  Topic Date Due   Pneumococcal Vaccine 71-42 Years old (1 - PCV) Never done   FOOT EXAM  Never done   OPHTHALMOLOGY EXAM  Never done   URINE MICROALBUMIN  Never done   HPV VACCINES (1 - 2-dose series) Never done   Hepatitis C Screening  Never done       Topic Date Due   HPV VACCINES (1 - 2-dose series) Never done     Lab Results  Component Value Date   TSH 1.210 06/30/2021   Lab Results  Component Value Date   WBC 6.7 06/11/2019   HGB 12.1 06/11/2019   HCT 37.5 06/11/2019   MCV 86.8 06/11/2019   PLT 339 06/11/2019   Lab Results  Component Value Date   NA 141 06/11/2019   K 3.9 06/11/2019   CO2 27 06/11/2019   GLUCOSE 102 (H) 09/01/2021   BUN 10 06/11/2019   CREATININE 0.76  06/11/2019   BILITOT 0.3 06/11/2019   AST 12 06/11/2019   ALT 11 06/11/2019   PROT 6.6 06/11/2019   CALCIUM 8.9 06/11/2019   Lab Results  Component Value Date   CHOL 222 (H) 09/01/2021   Lab Results  Component Value Date   HDL 47.70 09/01/2021   Lab Results  Component Value Date   LDLCALC 151 (H) 09/01/2021   Lab Results  Component Value Date   TRIG 114.0 09/01/2021   Lab Results  Component Value Date   CHOLHDL 5 09/01/2021   Lab Results  Component Value Date   HGBA1C 6.6 (H) 09/01/2021       Assessment & Plan:   Problem List Items Addressed This Visit   None Visit Diagnoses     Screen for sexually transmitted diseases    -  Primary   Relevant Orders   HIV antibody (with reflex)   RPR   GC/Chlamydia Probe Amp   Hepatitis C Antibody   Vaginal itching       Relevant Orders   HIV antibody (with reflex)   RPR   GC/Chlamydia Probe Amp   Hepatitis C Antibody   High risk heterosexual behavior       Relevant Orders   HIV antibody (with reflex)   RPR   GC/Chlamydia Probe Amp   Hepatitis C Antibody       Call the office with any questions or concerns. Recheck as scheduled and sooner as needed.     Eulis Foster, FNP

## 2021-09-03 LAB — HEPATITIS C ANTIBODY
Hepatitis C Ab: NONREACTIVE
SIGNAL TO CUT-OFF: 0.12 (ref <1.00)

## 2021-09-03 LAB — HIV ANTIBODY (ROUTINE TESTING W REFLEX): HIV 1&2 Ab, 4th Generation: NONREACTIVE

## 2021-09-03 LAB — RPR: RPR Ser Ql: NONREACTIVE

## 2021-09-04 LAB — GC/CHLAMYDIA PROBE AMP
Chlamydia trachomatis, NAA: NEGATIVE
Neisseria Gonorrhoeae by PCR: NEGATIVE

## 2021-09-22 ENCOUNTER — Other Ambulatory Visit: Payer: Self-pay

## 2021-09-22 ENCOUNTER — Encounter: Payer: Self-pay | Admitting: Obstetrics and Gynecology

## 2021-09-22 ENCOUNTER — Ambulatory Visit (INDEPENDENT_AMBULATORY_CARE_PROVIDER_SITE_OTHER): Payer: 59 | Admitting: Obstetrics and Gynecology

## 2021-09-22 VITALS — BP 140/90 | Ht 64.0 in | Wt 240.0 lb

## 2021-09-22 DIAGNOSIS — Z6841 Body Mass Index (BMI) 40.0 and over, adult: Secondary | ICD-10-CM | POA: Diagnosis not present

## 2021-09-22 DIAGNOSIS — Z131 Encounter for screening for diabetes mellitus: Secondary | ICD-10-CM | POA: Diagnosis not present

## 2021-09-22 DIAGNOSIS — E8881 Metabolic syndrome: Secondary | ICD-10-CM

## 2021-09-22 MED ORDER — METFORMIN HCL ER 500 MG PO TB24
500.0000 mg | ORAL_TABLET | Freq: Every day | ORAL | 1 refills | Status: DC
Start: 2021-09-22 — End: 2021-09-23

## 2021-09-22 MED ORDER — PHENTERMINE HCL 37.5 MG PO TABS: 37.5000 mg | ORAL_TABLET | Freq: Every day | ORAL | 0 refills | Status: DC

## 2021-09-22 NOTE — Progress Notes (Signed)
Gynecology Office Visit  Chief Complaint:  Chief Complaint  Patient presents with   Follow-up    History of Present Illness: Patientis a 26 y.o. G0P0000 female, who presents for the evaluation of the desire to lose weight. She has lost 5 pounds 1 months. The patient states the following symptoms since starting her weight loss therapy: appetite suppression, energy, and weight loss.  The patient also reports no other ill effects. The patient specifically denies heart palpitations, anxiety, and insomnia.   Has done well with addition of metformin minimal GI upset.   Review of Systems: 10 point review of systems negative unless otherwise noted in HPI  Past Medical History:  Past Medical History:  Diagnosis Date   Allergy    Anxiety    HSV-2 (herpes simplex virus 2) infection    Morbid obesity (HCC) 10/17/2017   Recurrent boils     Past Surgical History:  History reviewed. No pertinent surgical history.  Gynecologic History: No LMP recorded.  Obstetric History: G0P0000  Family History:  Family History  Problem Relation Age of Onset   Heart disease Mother    Stroke Mother    Diabetes Mother    Hypertension Mother    Thyroid disease Mother    Diabetes Father    Hypertension Maternal Grandmother    Gout Maternal Grandmother    Cancer Paternal Grandmother        Pancreatic   Pancreatic cancer Paternal Grandmother     Social History:  Social History   Socioeconomic History   Marital status: Single    Spouse name: Not on file   Number of children: 0   Years of education: Not on file   Highest education level: Not on file  Occupational History   Occupation: Substance abuse counselor    Comment: Teacher, English as a foreign language  Tobacco Use   Smoking status: Never   Smokeless tobacco: Never  Vaping Use   Vaping Use: Former  Substance and Sexual Activity   Alcohol use: Yes    Comment: ocassionally   Drug use: Yes    Types: Marijuana    Comment: 1/2 gram  daily   Sexual activity: Yes    Partners: Male    Birth control/protection: Condom  Other Topics Concern   Not on file  Social History Narrative   Not on file   Social Determinants of Health   Financial Resource Strain: Not on file  Food Insecurity: Not on file  Transportation Needs: Not on file  Physical Activity: Not on file  Stress: Not on file  Social Connections: Not on file  Intimate Partner Violence: Not on file    Allergies:  No Known Allergies  Medications: Prior to Admission medications   Medication Sig Start Date End Date Taking? Authorizing Provider  metFORMIN (GLUCOPHAGE XR) 500 MG 24 hr tablet Take 1 tablet (500 mg total) by mouth daily with breakfast. 08/25/21  Yes Vena Austria, MD  phentermine (ADIPEX-P) 37.5 MG tablet Take 1 tablet (37.5 mg total) by mouth daily before breakfast. 08/25/21  Yes Vena Austria, MD    Physical Exam Blood pressure 140/90, height 5\' 4"  (1.626 m), weight 240 lb (108.9 kg). Wt Readings from Last 3 Encounters:  09/22/21 240 lb (108.9 kg)  09/02/21 245 lb (111.1 kg)  08/25/21 231 lb (104.8 kg)  Body mass index is 41.2 kg/m.   General: NAD HEENT: normocephalic, anicteric Thyroid: no enlargement Pulmonary: no increased work of breathing Neurologic: Grossly intact Psychiatric: mood appropriate, affect  full  Assessment: 26 y.o. G0P0000  medication follow up medical weight loss, insulin resistance   Plan: Problem List Items Addressed This Visit   None Visit Diagnoses     Insulin resistance    -  Primary   Relevant Orders   Hemoglobin A1c (Completed)   Class 3 severe obesity without serious comorbidity with body mass index (BMI) of 40.0 to 44.9 in adult, unspecified obesity type (HCC)       Relevant Medications   phentermine (ADIPEX-P) 37.5 MG tablet   Other Relevant Orders   Hemoglobin A1c (Completed)   Screening for diabetes mellitus       Relevant Orders   Hemoglobin A1c (Completed)       1) 1500  Calorie ADA Diet  2) Patient education given regarding appropriate lifestyle changes for weight loss including: regular physical activity, healthy coping strategies, caloric restriction and healthy eating patterns.  3) Patient will be started on weight loss medication. The risks and benefits and side effects of medication, such as Adipex (Phenteramine) ,  Tenuate (Diethylproprion), Belviq (lorcarsin), Contrave (buproprion/naltrexone), Qsymia (phentermine/topiramate), and Saxenda (liraglutide) is discussed. The pros and cons of suppressing appetite and boosting metabolism is discussed. Risks of tolerence and addiction is discussed for selected agents discussed. Use of medicine will ne short term, such as 3-4 months at a time followed by a period of time off of the medicine to avoid these risks and side effects for Adipex, Qsymia, and Tenuate discussed. Pt to call with any negative side effects and agrees to keep follow up appts.  4) Patient to take medication, with the benefits of appetite suppression and metabolism boost d/w pt, along with the side effects and risk factors of long term use that will be avoided with our use of short bursts of therapy. Rx provided.   - will recheck HgbA1C  5) 15 minutes face-to-face; with counseling/coordination of care > 50 percent of visit related to obesity and ongoing management/treatment   6) Return if symptoms worsen or fail to improve.    Vena Austria, MD, Evern Core [facility], [facility] 09/22/2021, 2:15 PM

## 2021-09-23 ENCOUNTER — Encounter: Payer: Self-pay | Admitting: Obstetrics and Gynecology

## 2021-09-23 ENCOUNTER — Other Ambulatory Visit: Payer: Self-pay | Admitting: Obstetrics and Gynecology

## 2021-09-23 LAB — HEMOGLOBIN A1C
Est. average glucose Bld gHb Est-mCnc: 143 mg/dL
Hgb A1c MFr Bld: 6.6 % — ABNORMAL HIGH (ref 4.8–5.6)

## 2021-09-23 MED ORDER — METFORMIN HCL ER (MOD) 1000 MG PO TB24: 1000.0000 mg | ORAL_TABLET | Freq: Every day | ORAL | 5 refills | Status: DC

## 2021-09-29 ENCOUNTER — Telehealth: Payer: Self-pay

## 2021-09-29 ENCOUNTER — Ambulatory Visit (INDEPENDENT_AMBULATORY_CARE_PROVIDER_SITE_OTHER): Payer: 59 | Admitting: Family Medicine

## 2021-09-29 ENCOUNTER — Other Ambulatory Visit: Payer: Self-pay

## 2021-09-29 VITALS — BP 128/76 | HR 97 | Temp 96.9°F | Ht 64.0 in | Wt 240.4 lb

## 2021-09-29 DIAGNOSIS — J069 Acute upper respiratory infection, unspecified: Secondary | ICD-10-CM

## 2021-09-29 DIAGNOSIS — F32A Depression, unspecified: Secondary | ICD-10-CM

## 2021-09-29 DIAGNOSIS — N760 Acute vaginitis: Secondary | ICD-10-CM | POA: Diagnosis not present

## 2021-09-29 MED ORDER — FLUCONAZOLE 150 MG PO TABS: 150.0000 mg | ORAL_TABLET | Freq: Once | ORAL | 0 refills | Status: AC

## 2021-09-29 MED ORDER — VENLAFAXINE HCL ER 37.5 MG PO CP24: 37.5000 mg | ORAL_CAPSULE | Freq: Every day | ORAL | 5 refills | Status: DC

## 2021-09-29 NOTE — Telephone Encounter (Signed)
Spoke to patient and she understands and is going to use her coping skills and reach out to her therapist if she needs anything. Dm/cma

## 2021-09-29 NOTE — Telephone Encounter (Signed)
Patient is asking for a note for work.  She states that she asked for 10/04/21 -08/09/22 off from work and was declined due to she didn't give a 2 weeks notice.   Please review and advise.  Thanks.  Dm/cma

## 2021-09-29 NOTE — Progress Notes (Signed)
[facility] [address] [place] [address] Dept: [phone] Dept Fax: [phone]  Office Visit  Subjective:    Patient ID: Monique Jackson, female    DOB: August 09, 1996, 26 y.o..   MRN: [id number]  Chief Complaint  Patient presents with   Acute Visit    C/o having nausea, HA's not feeling good 3 weeks.  She has Tylenol.  Neg flu and covid test.  Neg pregnancy test.     History of Present Illness:  Patient is in today for evaluation of a three-week history of illness. She notes that during the 2nd week of Jan. Monique Jackson developed headache. She used OTC meds and occasional marijuana to resolve the issue. She then noted a day's worth of neck pain that resolved after she popped her neck. By that weekend, some of this was improving, but she started an allergy pill. Soon after, she noted her appetite was very poor. She started having issues with brain fog/poor focus at work, nausea, and by mid week, a non-productive cough. She went for a COVID test, which returned negative. She was also having some crackling noises with breathing and alternating chills and sweats, esp. at night. She went to a [facility]. She tested negative for influenza, but was diagnosed with a left ear infection (despite having no ear symptoms or pain) and was prescribed Augmentin. Since starting he Augmentin, her symptoms have not improved, but she is having some vaginal itching now.  Monique Jackson is currently managed on phentermine by her GYN for weight loss, esp. as she has concerns about fertility at her current weight. She has been on this since late Oct.  Monique Jackson admits that she has always been a happy person, but does admit to covering some of her underlying depressive symptoms. She describes long-standing issues with her childhood and relationship with her parents that have been very hard for her. 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  Past Medical History: Patient Active Problem List   Diagnosis Date Noted   Marijuana use, continuous 07/14/2021   Mixed hyperlipidemia 06/12/2019   Type 2 diabetes mellitus (HCC) 06/12/2019   Hidradenitis suppurativa 06/11/2019   BMI 40.0-44.9, adult (HCC) 06/06/2018   No past surgical history on file.  Family History  Problem Relation Age of Onset   Heart disease Mother    Stroke Mother    Diabetes Mother    Hypertension Mother    Thyroid disease Mother    Diabetes Father    Hypertension Maternal Grandmother    Gout Maternal Grandmother    Cancer Paternal Grandmother        Pancreatic   Pancreatic cancer Paternal Grandmother    Outpatient Medications Prior to Visit  Medication Sig Dispense Refill   metFORMIN (GLUMETZA) 1000 MG (MOD) 24 hr tablet Take 1 tablet (1,000 mg total) by mouth daily with breakfast. 30 tablet 5   phentermine (ADIPEX-P) 37.5 MG tablet Take 1 tablet (37.5 mg total) by mouth daily before breakfast. 30 tablet 0   amoxicillin-clavulanate (AUGMENTIN) 875-125 MG tablet Take 1 tablet by mouth 2 (two) times daily.     No facility-administered medications prior to visit.   No Known Allergies    Objective:   Today's Vitals   09/29/21 0813  BP: 128/76  Pulse: 97  Temp: (!) 96.9 F (36.1 C)  TempSrc: Temporal  SpO2: 95%  Weight: 240 lb 6.4 oz (109 kg)  Height: 5\' 4"  (1.626 m)  Body mass index is 41.26 kg/m.   General: Well developed, well nourished. No acute distress. HEENT: Normocephalic, non-traumatic. Conjunctiva clear. External ears normal. EAC and TMs normal   bilaterally. Mucous membranes moist. Mild mucous streaking of posterior oropharynx clear. Good dentition. Neck: Supple. No lymphadenopathy. No thyromegaly. Lungs: Clear to auscultation bilaterally. No wheezing, rales or rhonchi. CV: RRR without murmurs or rubs. Pulses 2+ bilaterally. Abdomen: Soft, non-tender. Bowel sounds positive, normal pitch and frequency. No  hepatosplenomegaly. No   rebound or guarding. Psych: Alert and oriented. Pressured speech pattern. Tearful as she discussed some of her modo issues.  Health Maintenance Due  Topic Date Due   FOOT EXAM  Never done   OPHTHALMOLOGY EXAM  Never done   URINE MICROALBUMIN  Never done   HPV VACCINES (1 - 2-dose series) Never done     Assessment & Plan:   1. Viral URI with cough Discussed home care for viral illness, including rest, pushing fluids, and OTC medications as needed for symptom relief. These should resolve with time. I do not see evidence today for an otitis media, so did discuss her considering stopping her antibiotic. Follow-up if needed for worsening or persistent symptoms.  2. Acute vaginitis It is likely that she has a vaginal yeast infeciton due to her recent anitbiotics. I will prescribe Diflucan.  - fluconazole (DIFLUCAN) 150 MG tablet; Take 1 tablet (150 mg total) by mouth once for 1 dose.  Dispense: 1 tablet; Refill: 0  3. Depression, unspecified depression type Monique Jackson has symptoms of depression, possibly going on for years. I will start her on Effexor and plan to see her back in 6 weeks to assess effectiveness. I recommend she also speak with her therapist about what she is going through.  - venlafaxine XR (EFFEXOR XR) 37.5 MG 24 hr capsule; Take 1 capsule (37.5 mg total) by mouth daily with breakfast.  Dispense: 30 capsule; Refill: 5  Loyola Mast, MD

## 2021-10-02 ENCOUNTER — Telehealth: Payer: Self-pay | Admitting: Family Medicine

## 2021-10-02 DIAGNOSIS — N760 Acute vaginitis: Secondary | ICD-10-CM

## 2021-10-02 MED ORDER — FLUCONAZOLE 150 MG PO TABS: 150.0000 mg | ORAL_TABLET | Freq: Every day | ORAL | 0 refills | Status: DC

## 2021-10-02 NOTE — Telephone Encounter (Signed)
Please review and advise. Thanks. Dm/cma  

## 2021-10-02 NOTE — Telephone Encounter (Signed)
Patient notified VIA phone.  No questions.  Dm/cma ? ?

## 2021-10-02 NOTE — Telephone Encounter (Signed)
Pt saw Dr Veto Kemps on 1/24 and was prescribed fluconazole I dose she doesn't see any relief. Is it possible to send in a second dose? She is very uncomfortable.

## 2021-10-02 NOTE — Addendum Note (Signed)
Addended by: Loyola Mast on: 10/02/2021 03:11 PM   Modules accepted: Orders

## 2021-10-06 ENCOUNTER — Encounter: Payer: Self-pay | Admitting: Family Medicine

## 2021-10-06 ENCOUNTER — Other Ambulatory Visit (HOSPITAL_COMMUNITY)
Admission: RE | Admit: 2021-10-06 | Discharge: 2021-10-06 | Disposition: A | Discharge status: Home / Self Care | Payer: 59 | Source: Ambulatory Visit | Attending: Family Medicine | Admitting: Family Medicine

## 2021-10-06 ENCOUNTER — Ambulatory Visit (INDEPENDENT_AMBULATORY_CARE_PROVIDER_SITE_OTHER): Payer: 59 | Admitting: Family Medicine

## 2021-10-06 ENCOUNTER — Other Ambulatory Visit: Payer: Self-pay

## 2021-10-06 VITALS — BP 122/68 | HR 101 | Temp 97.0°F | Ht 64.0 in | Wt 238.2 lb

## 2021-10-06 DIAGNOSIS — N760 Acute vaginitis: Secondary | ICD-10-CM

## 2021-10-06 DIAGNOSIS — B009 Herpesviral infection, unspecified: Secondary | ICD-10-CM | POA: Insufficient documentation

## 2021-10-06 NOTE — Progress Notes (Signed)
[facility] [address] [place] [address] Dept: [phone] Dept Fax: [phone]  Office Visit  Subjective:    Patient ID: Monique Jackson, female    DOB: 10-31-1995, 26 y.o..   MRN: [id number]  Chief Complaint  Patient presents with   Follow-up    Follow up on vaginal itching per patient very red and irritated little raw.     History of Present Illness:  Patient is in today for reassessment of vaginal itching. Monique Jackson was seen by me on 1/24 for follow-up from a recent urgent care visit. She had been prescribed Augmentin for a cough. During the visit, she noted she was having some vaginal itching that had developed since starting the antibiotic. I treated her empirically with Diflucan 150 mg x 1. She contacted me back on 1/27 with ongoing itching. It prescribed a 3-day course of Diflucan. She took one additional dose of this, but began noting more intense vaginal itching. She tried using Monistat cream on the labia to help with the itching, but has not had relief from this so far. She also tried using a vaginal boric acid suppository, but found this caused a burning sensation in the vagina. 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  Past Medical History: Patient Active Problem List   Diagnosis Date Noted   Marijuana use, continuous 07/14/2021   Mixed hyperlipidemia 06/12/2019   Type 2 diabetes mellitus (HCC) 06/12/2019   Hidradenitis suppurativa 06/11/2019   BMI 40.0-44.9, adult (HCC) 06/06/2018   No past surgical history on file.  Family History  Problem Relation Age of Onset   Heart disease Mother    Stroke Mother    Diabetes Mother    Hypertension Mother    Thyroid disease Mother    Diabetes Father    Hypertension Maternal Grandmother    Gout Maternal Grandmother    Cancer Paternal Grandmother        Pancreatic   Pancreatic cancer Paternal Grandmother      Outpatient Medications Prior to Visit  Medication Sig Dispense Refill   metFORMIN (GLUMETZA) 1000 MG (MOD) 24 hr tablet Take 1 tablet (1,000 mg total) by mouth daily with breakfast. 30 tablet 5   phentermine (ADIPEX-P) 37.5 MG tablet Take 1 tablet (37.5 mg total) by mouth daily before breakfast. 30 tablet 0   venlafaxine XR (EFFEXOR XR) 37.5 MG 24 hr capsule Take 1 capsule (37.5 mg total) by mouth daily with breakfast. 30 capsule 5   fluconazole (DIFLUCAN) 150 MG tablet Take 1 tablet (150 mg total) by mouth daily. 3 tablet 0   amoxicillin-clavulanate (AUGMENTIN) 875-125 MG tablet Take 1 tablet by mouth 2 (two) times daily. (Patient not taking: Reported on 10/06/2021)     No facility-administered medications prior to visit.   No Known Allergies    Objective:   Today's Vitals   10/06/21 0808  BP: 122/68  Pulse: (!) 101  Temp: (!) 97 F (36.1 C)  TempSrc: Temporal  SpO2: 96%  Weight: 238 lb 3.2 oz (108 kg)  Height: 5\' 4"  (1.626 m)   Body mass index is 40.89 kg/m.   General: Well developed, well nourished. No acute distress. GU: There is a clumpy white discharge on the labia. Vaginal vault is pink with a moderate   amount of clumpy white discharge present. Cervix is pink and appears normal.  Psych: Alert and oriented. Normal mood and affect.  Health  Maintenance Due  Topic Date Due   FOOT EXAM  Never done   OPHTHALMOLOGY EXAM  Never done   URINE MICROALBUMIN  Never done   HPV VACCINES (1 - 2-dose series) Never done   Assessment & Plan:   1. Acute vaginitis The appearance of the discharge is consistent with a yeast vaginitis. As she has failed Diflucan, I suspect this may be some other Candidal species. I obtained a swab for wet prep and KOH. I will await the results prior to making further recommendations for treatment.  - Cervicovaginal ancillary only  Loyola Mast, MD

## 2021-10-07 ENCOUNTER — Ambulatory Visit: Payer: 59 | Admitting: Family Medicine

## 2021-10-07 LAB — CERVICOVAGINAL ANCILLARY ONLY
Bacterial Vaginitis (gardnerella): NEGATIVE
Candida Glabrata: NEGATIVE
Candida Vaginitis: NEGATIVE
Comment: NEGATIVE
Comment: NEGATIVE
Comment: NEGATIVE
Comment: NEGATIVE
Trichomonas: NEGATIVE

## 2021-11-12 ENCOUNTER — Other Ambulatory Visit: Payer: Self-pay

## 2021-11-16 ENCOUNTER — Ambulatory Visit (INDEPENDENT_AMBULATORY_CARE_PROVIDER_SITE_OTHER): Payer: 59 | Admitting: Family Medicine

## 2021-11-16 ENCOUNTER — Other Ambulatory Visit: Payer: Self-pay

## 2021-11-16 VITALS — BP 122/80 | HR 89 | Temp 97.3°F | Wt 231.4 lb

## 2021-11-16 DIAGNOSIS — E119 Type 2 diabetes mellitus without complications: Secondary | ICD-10-CM

## 2021-11-16 DIAGNOSIS — F39 Unspecified mood [affective] disorder: Secondary | ICD-10-CM

## 2021-11-16 DIAGNOSIS — E782 Mixed hyperlipidemia: Secondary | ICD-10-CM

## 2021-11-16 DIAGNOSIS — Z6839 Body mass index (BMI) 39.0-39.9, adult: Secondary | ICD-10-CM

## 2021-11-16 MED ORDER — PHENTERMINE HCL 37.5 MG PO TABS: 37.5000 mg | ORAL_TABLET | Freq: Every day | ORAL | 0 refills | Status: DC

## 2021-11-16 NOTE — Progress Notes (Signed)
?[place] [facility] ?[facility] ?[address] ?[place] [address] ?Dept: [phone] ?Dept Fax: [phone] ? ?Chronic Care Office Visit ? ?Subjective:  ? ? Patient ID: Monique Jackson, female    DOB: Nov 15, 1995, 26 y.o..   MRN: [id number] ? ?Chief Complaint  ?Patient presents with  ? Follow-up  ?  6 weeks f/u.  Stopped taking the Venlafaxine.   ? ? ?History of Present Illness: ? ?Patient is in today for reassessment of chronic medical issues. ? ?Monique Jackson has a history of obesity. This is complicated with Type 2 diabetes and hyperlipidemia. Dr. Georgianne Fick (GYN) had initiated her on phentermine. She notes that her weight was 245 lbs prior to starting the medication this past Fall. She has recently resumed X95, a weight loss program involving workouts and restricted calories. Although I had prescribed metformin, Monique Jackson had not started taking this. She ahd been worried that her schedule wouldn't allow her to take this every morning. ? ?Monique Jackson had been having depressive symptoms. At her last visit in January, I started her on venlafaxine. She notes that she became concerned about the potential risk of using this along with the phentermine. She stopped the medication and feels her mood has improved since that time. She has financial and work-related stressors but feels she is managing these. ? ?Past Medical History: ?Patient Active Problem List  ? Diagnosis Date Noted  ? 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 Marijuana use, continuous 07/14/2021  ? Mixed hyperlipidemia 06/12/2019  ? Type 2 diabetes mellitus ([place]) 06/12/2019  ? Hidradenitis suppurativa 06/11/2019  ? Class 2 severe obesity due to excess calories with serious comorbidity and body mass index (BMI) of 39.0 to 39.9 in adult [facility]) 10/17/2017  ? ?No past surgical history on file. ? ?Family History  ?Problem Relation Age of Onset  ? Heart disease Mother   ? Stroke Mother   ? Diabetes Mother   ? Hypertension Mother   ?  Thyroid disease Mother   ? Diabetes Father   ? Hypertension Maternal Grandmother   ? Gout Maternal Grandmother   ? Cancer Paternal Grandmother   ?     Pancreatic  ? Pancreatic cancer Paternal Grandmother   ? ?Outpatient Medications Prior to Visit  ?Medication Sig Dispense Refill  ? phentermine (ADIPEX-P) 37.5 MG tablet Take 1 tablet (37.5 mg total) by mouth daily before breakfast. 30 tablet 0  ? metFORMIN (GLUMETZA) 1000 MG (MOD) 24 hr tablet Take 1 tablet (1,000 mg total) by mouth daily with breakfast. (Patient not taking: Reported on 11/16/2021) 30 tablet 5  ? venlafaxine XR (EFFEXOR XR) 37.5 MG 24 hr capsule Take 1 capsule (37.5 mg total) by mouth daily with breakfast. (Patient not taking: Reported on 11/16/2021) 30 capsule 5  ? ?No facility-administered medications prior to visit.  ? ?No Known Allergies ?   ?Objective:  ? ?Today's Vitals  ? 11/16/21 1607  ?BP: 122/80  ?Pulse: 89  ?Temp: (!) 97.3 ?F (36.3 ?C)  ?TempSrc: Temporal  ?SpO2: 98%  ?Weight: 231 lb 6.4 oz (105 kg)  ? ?Body mass index is 39.72 kg/m?.  ? ?General: Well developed, well nourished. No acute distress. ?Psych: alert and oriented. Normal mood and affect. ? ?Health Maintenance Due  ?Topic Date Due  ? FOOT EXAM  Never done  ? OPHTHALMOLOGY EXAM  Never done  ? URINE MICROALBUMIN  Never done  ? HPV VACCINES (1 - 2-dose series) Never done  ? ?Lab Results ?Last lipids ?Lab Results  ?Component Value  Date  ? CHOL 222 (H) 09/01/2021  ? HDL 47.70 09/01/2021  ? LDLCALC 151 (H) 09/01/2021  ? TRIG 114.0 09/01/2021  ? CHOLHDL 5 09/01/2021  ? ?Last hemoglobin A1c ?Lab Results  ?Component Value Date  ? HGBA1C 6.6 (H) 09/22/2021  ? ?Depression screen [facility] 2/9 11/16/2021 10/06/2021 07/14/2021  ?Decreased Interest 1 0 0  ?Down, Depressed, Hopeless 1 0 0  ?PHQ - 2 Score 2 0 0  ?Altered sleeping 1 - -  ?Tired, decreased energy 0 - -  ?Change in appetite 0 - -  ?Feeling bad or failure about yourself  0 - -  ?Trouble concentrating 0 - -  ?Moving slowly or fidgety/restless 0  - -  ?Suicidal thoughts 0 - -  ?PHQ-9 Score 3 - -  ?Difficult doing work/chores Somewhat difficult - -  ? ?GAD 7 : Generalized Anxiety Score 11/16/2021  ?Nervous, Anxious, on Edge 1  ?Control/stop worrying 1  ?Worry too much - different things 1  ?Trouble relaxing 2  ?Restless 1  ?Easily annoyed or irritable 3  ?Afraid - awful might happen 0  ?Total GAD 7 Score 9  ?Anxiety Difficulty Not difficult at all  ?   ?Assessment & Plan:  ? ?1. Class 2 severe obesity due to excess calories with serious comorbidity and body mass index (BMI) of 39.0 to 39.9 in adult [facility])- Type 2 diabetes and hyperlipidemia ?Monique Jackson has had 19 lb. of weight loss since October. She has requested that I assume responsibility for prescribing her phentermine. I will plan to see her every 2 months to monitor her weight loss and make sure her blood pressure remains in good range. ? ?- phentermine (ADIPEX-P) 37.5 MG tablet; Take 1 tablet (37.5 mg total) by mouth daily before breakfast.  Dispense: 30 tablet; Refill: 0 ? ?2. Type 2 diabetes mellitus without complication, without long-term current use of insulin ([place]) ?Last A1c was at goal. I encouraged her to start the metformin and it will also potentially benefit her weight loss. We will check labs at her next visit. ? ?3. Mixed hyperlipidemia ?Monique Jackson's LDL is <160. We will focus on her weight loss for management for now. ? ?4. Mood disorder ([place]) ?Stable. Her GAD7 score is mildly high, but she appears to be managing well off meds for now. We will continue to monitor this. ? ?Return in about 2 months (around 01/16/2022).  ? ?Haydee Salter, MD ?

## 2021-11-19 ENCOUNTER — Other Ambulatory Visit: Payer: Self-pay

## 2021-11-20 ENCOUNTER — Other Ambulatory Visit (HOSPITAL_COMMUNITY)
Admission: RE | Admit: 2021-11-20 | Discharge: 2021-11-20 | Disposition: A | Discharge status: Home / Self Care | Payer: 59 | Source: Ambulatory Visit | Attending: Family Medicine | Admitting: Family Medicine

## 2021-11-20 ENCOUNTER — Ambulatory Visit (INDEPENDENT_AMBULATORY_CARE_PROVIDER_SITE_OTHER): Payer: 59 | Admitting: Family Medicine

## 2021-11-20 VITALS — BP 140/72 | HR 100 | Temp 97.5°F | Ht 64.0 in | Wt 231.2 lb

## 2021-11-20 DIAGNOSIS — Z113 Encounter for screening for infections with a predominantly sexual mode of transmission: Secondary | ICD-10-CM

## 2021-11-20 DIAGNOSIS — B9689 Other specified bacterial agents as the cause of diseases classified elsewhere: Secondary | ICD-10-CM | POA: Diagnosis not present

## 2021-11-20 DIAGNOSIS — N898 Other specified noninflammatory disorders of vagina: Secondary | ICD-10-CM | POA: Diagnosis not present

## 2021-11-20 DIAGNOSIS — N760 Acute vaginitis: Secondary | ICD-10-CM

## 2021-11-20 NOTE — Progress Notes (Signed)
?  [place] [facility] ?[facility] ?[address] ?[place] [address] ?Dept: [phone] ?Dept Fax: [phone] ? ?Office Visit ? ?Subjective:  ? ? Patient ID: Monique Jackson, female    DOB: 1995-09-26, 26 y.o..   MRN: [id number] ? ?Chief Complaint  ?Patient presents with  ? Acute Visit  ?  Wants STD testing. Also having itching/irritation, cloudy discharge x 4 days.     ? ? ?History of Present Illness: ? ?Patient is in today requesting STI screening. She notes she forgot to raise this issue at her recent appointment. She did have unprotected intercourse 4 days ago. She notes she has had some vaginal itching and discharge since then. ? ?Past Medical History: ?Patient Active Problem List  ? Diagnosis Date Noted  ? Mood disorder (HCC) 11/16/2021  ? 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 Marijuana use, continuous 07/14/2021  ? Mixed hyperlipidemia 06/12/2019  ? Type 2 diabetes mellitus (HCC) 06/12/2019  ? Hidradenitis suppurativa 06/11/2019  ? Class 2 severe obesity due to excess calories with serious comorbidity and body mass index (BMI) of 39.0 to 39.9 in adult [facility]) 10/17/2017  ? ?No past surgical history on file. ? ?Family History  ?Problem Relation Age of Onset  ? Heart disease Mother   ? Stroke Mother   ? Diabetes Mother   ? Hypertension Mother   ? Thyroid disease Mother   ? Diabetes Father   ? Hypertension Maternal Grandmother   ? Gout Maternal Grandmother   ? Cancer Paternal Grandmother   ?     Pancreatic  ? Pancreatic cancer Paternal Grandmother   ? ?Outpatient Medications Prior to Visit  ?Medication Sig Dispense Refill  ? metFORMIN (GLUMETZA) 1000 MG (MOD) 24 hr tablet Take 1 tablet (1,000 mg total) by mouth daily with breakfast. (Patient not taking: Reported on 11/16/2021) 30 tablet 5  ? phentermine (ADIPEX-P) 37.5 MG tablet Take 1 tablet (37.5 mg total) by mouth daily before breakfast. 30 tablet 0  ? ?No facility-administered medications prior to visit.  ? ?No Known Allergies ?    ?Objective:  ? ?Today's Vitals  ? 11/20/21 1107  ?BP: 140/72  ?Pulse: 100  ?Temp: (!) 97.5 ?F (36.4 ?C)  ?TempSrc: Temporal  ?SpO2: 99%  ?Weight: 231 lb 3.2 oz (104.9 kg)  ?Height: 5\' 4"  (1.626 m)  ? ?Body mass index is 39.69 kg/m?.  ? ?General: Well developed, well nourished. No acute distress. ?GU: Normal external genitalia. Vaginal vault is pink with moderate, thick white discharge. Cervix is  ? pink and appears normal. No CMT. Uterus normal size. No adnexal masses. No anterior or posterior  ? wall prolapse. ?Psych: Alert and oriented. Normal mood and affect. ? ?Health Maintenance Due  ?Topic Date Due  ? FOOT EXAM  Never done  ? OPHTHALMOLOGY EXAM  Never done  ? URINE MICROALBUMIN  Never done  ? HPV VACCINES (1 - 2-dose series) Never done  ?   ?Assessment & Plan:  ? ?1. Routine screening for STI (sexually transmitted infection) ?Completing STI testing today. I counseled [name] about the importance of condom use. She is aware though has some conflicting issues related to this. ? ?- Cervicovaginal ancillary only ?- HIV Antibody (routine testing w rflx) ?- RPR ? ?2. Vaginal discharge ?Discharge does not look like anything pathologic. Will await wet prep results. ? ?- Cervicovaginal ancillary only ? ?Return if symptoms worsen or fail to improve.  ? ? , MD ?

## 2021-11-23 LAB — CERVICOVAGINAL ANCILLARY ONLY
Bacterial Vaginitis (gardnerella): POSITIVE — AB
Candida Glabrata: NEGATIVE
Candida Vaginitis: NEGATIVE
Chlamydia: NEGATIVE
Comment: NEGATIVE
Comment: NEGATIVE
Comment: NEGATIVE
Comment: NEGATIVE
Comment: NEGATIVE
Comment: NORMAL
Neisseria Gonorrhea: NEGATIVE
Trichomonas: NEGATIVE

## 2021-11-23 LAB — RPR: RPR Ser Ql: NONREACTIVE

## 2021-11-23 LAB — HIV ANTIBODY (ROUTINE TESTING W REFLEX): HIV 1&2 Ab, 4th Generation: NONREACTIVE

## 2021-11-23 MED ORDER — METRONIDAZOLE 500 MG PO TABS: 500.0000 mg | ORAL_TABLET | Freq: Three times a day (TID) | ORAL | 0 refills | Status: AC

## 2021-11-23 NOTE — Addendum Note (Signed)
Addended by: Loyola Mast on: 11/23/2021 05:39 PM ? ? Modules accepted: Orders ? ?

## 2021-12-22 ENCOUNTER — Other Ambulatory Visit: Payer: Self-pay | Admitting: Family Medicine

## 2021-12-22 MED ORDER — PHENTERMINE HCL 37.5 MG PO TABS: 37.5000 mg | ORAL_TABLET | Freq: Every day | ORAL | 0 refills | Status: DC

## 2021-12-22 NOTE — Telephone Encounter (Signed)
Refill request for  ?Phentermine 37.5 mg  ?LR 11/16/21, #30, 0 rf ?LOV 11/16/21 ?FOV  none scheduled.  ? ?Please review and advise.  ?Thanks.   Dm/cma ? ?

## 2021-12-30 ENCOUNTER — Encounter: Payer: 59 | Admitting: Nurse Practitioner

## 2022-01-20 ENCOUNTER — Telehealth: Payer: Self-pay | Admitting: Family Medicine

## 2022-01-20 NOTE — Telephone Encounter (Signed)
Patient notified VIA phone to ask the pharmacy to get the RX ready for her.  No further questions. Dm/cma ? ?

## 2022-01-20 NOTE — Telephone Encounter (Signed)
Pt was unable to pick up the med phentermine in time and they put it back on the shelf can you please send again she gets pd Friday and will be able to pick it up. She asked if she needs to make a follow up or weigh she will be glad to. ?

## 2022-03-31 ENCOUNTER — Encounter: Payer: Self-pay | Admitting: Family Medicine

## 2022-03-31 ENCOUNTER — Ambulatory Visit (INDEPENDENT_AMBULATORY_CARE_PROVIDER_SITE_OTHER): Payer: 59 | Admitting: Family Medicine

## 2022-03-31 VITALS — BP 134/78 | HR 76 | Temp 97.8°F | Ht 64.0 in | Wt 228.0 lb

## 2022-03-31 DIAGNOSIS — Z6839 Body mass index (BMI) 39.0-39.9, adult: Secondary | ICD-10-CM

## 2022-03-31 DIAGNOSIS — Z113 Encounter for screening for infections with a predominantly sexual mode of transmission: Secondary | ICD-10-CM

## 2022-03-31 DIAGNOSIS — E119 Type 2 diabetes mellitus without complications: Secondary | ICD-10-CM | POA: Diagnosis not present

## 2022-03-31 DIAGNOSIS — E782 Mixed hyperlipidemia: Secondary | ICD-10-CM

## 2022-03-31 LAB — URINALYSIS, ROUTINE W REFLEX MICROSCOPIC
Bilirubin Urine: NEGATIVE
Hgb urine dipstick: NEGATIVE
Ketones, ur: NEGATIVE
Leukocytes,Ua: NEGATIVE
Nitrite: NEGATIVE
Specific Gravity, Urine: 1.02 (ref 1.000–1.030)
Total Protein, Urine: NEGATIVE
Urine Glucose: NEGATIVE
Urobilinogen, UA: 1 (ref 0.0–1.0)
pH: 6.5 (ref 5.0–8.0)

## 2022-03-31 LAB — BASIC METABOLIC PANEL
BUN: 16 mg/dL (ref 6–23)
CO2: 27 mEq/L (ref 19–32)
Calcium: 9 mg/dL (ref 8.4–10.5)
Chloride: 106 mEq/L (ref 96–112)
Creatinine, Ser: 0.8 mg/dL (ref 0.40–1.20)
GFR: 101.44 mL/min (ref >60.00)
Glucose, Bld: 111 mg/dL — ABNORMAL HIGH (ref 70–99)
Potassium: 4.1 mEq/L (ref 3.5–5.1)
Sodium: 138 mEq/L (ref 135–145)

## 2022-03-31 LAB — MICROALBUMIN / CREATININE URINE RATIO
Creatinine,U: 89.1 mg/dL
Microalb Creat Ratio: 0.8 mg/g (ref 0.0–30.0)
Microalb, Ur: <0.7 mg/dL (ref 0.0–1.9)

## 2022-03-31 LAB — LIPID PANEL
Cholesterol: 241 mg/dL — ABNORMAL HIGH (ref 0–200)
HDL: 44.6 mg/dL (ref >39.00)
LDL Cholesterol: 174 mg/dL — ABNORMAL HIGH (ref 0–99)
NonHDL: 196.29
Total CHOL/HDL Ratio: 5
Triglycerides: 109 mg/dL (ref 0.0–149.0)
VLDL: 21.8 mg/dL (ref 0.0–40.0)

## 2022-03-31 LAB — HEMOGLOBIN A1C: Hgb A1c MFr Bld: 6.4 % (ref 4.6–6.5)

## 2022-03-31 NOTE — Progress Notes (Signed)
[facility] [address] [place] [address] Dept: [phone] Dept Fax: [phone]  Chronic Care Office Visit  Subjective:    Patient ID: Monique Jackson, female    DOB: [birth year], 26 y.o..   MRN: [id number]  Chief Complaint  Patient presents with   Follow-up    F/u meds and wants STD screening done.     History of Present Illness:  Patient is in today for reassessment of chronic medical issues.  Monique Jackson has a history of obesity. I had prescribed phentermine earlier in the year for this. She has not necessarily been taking this consistently. She notes she has not had any in the past 2 months. When she does take the medicine, she splits the pills in half. Her starting weight was 245 lbs last Fall.  Monique Jackson was diagnosed with Type 2 diabetes this past winter. She is managed on metformin 1000 mg daily. She notes that she does miss doses, so is maybe taking this half the time.   Monique Jackson has hyperlipidemia. She is not currently on medication for this.  Monique Jackson engages in regular STI screening. She has a current stable partner and does engage in unprotected intercourse at times. She denies any vaginal symptoms today.  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 No pertinent surgical history.  Family History  Problem Relation Age of Onset   Heart disease Mother    Stroke Mother    Diabetes Mother    Hypertension Mother    Thyroid disease Mother    Diabetes Father    Hypertension Maternal  Grandmother    Gout Maternal Grandmother    Cancer Paternal Grandmother        Pancreatic   Pancreatic cancer Paternal Grandmother    Outpatient Medications Prior to Visit  Medication Sig Dispense Refill   metFORMIN (GLUMETZA) 1000 MG (MOD) 24 hr tablet Take 1 tablet (1,000 mg total) by mouth daily with breakfast. 30 tablet 5   phentermine (ADIPEX-P) 37.5 MG tablet Take 1 tablet (37.5 mg total) by mouth daily before breakfast. 30 tablet 0   No facility-administered medications prior to visit.   No Known Allergies    Objective:   Today's Vitals   03/31/22 0800  BP: 134/78  Pulse: 76  Temp: 97.8 F (36.6 C)  TempSrc: Temporal  SpO2: 99%  Weight: 228 lb (103.4 kg)  Height: 5\' 4"  (1.626 m)   Body mass index is 39.14 kg/m.   General: Well developed, well nourished. No acute distress. Feet- Skin intact. No sign of maceration between toes. Nails are normal. Dorsalis pedis and posterior tibial artery   pulses are normal. 5.07 monofilament testing normal. Psych: Alert and oriented. Normal mood and affect.  Health Maintenance Due  Topic Date Due   OPHTHALMOLOGY EXAM  Never done   URINE MICROALBUMIN  Never done   HEMOGLOBIN A1C  03/22/2022     Assessment & Plan:   1. Type 2 diabetes mellitus without complication, without long-term current use of insulin (HCC) We will obtain annual diabetes labs today. Foot exam  completed. I reminded Monique Jackson about the importance of getting an annual eye exam. I will continue her on metformin 1000 mg daily.  - Lipid panel - Microalbumin / creatinine urine ratio - Basic metabolic panel - Hemoglobin A1c - Urinalysis, Routine w reflex microscopic  2. Class 2 severe obesity due to excess calories with serious comorbidity and body mass index (BMI) of 39.0 to 39.9 in adult (HCC) Max weight: 245 lb Current weight loss: 18 lbs. I will continue phentermine 37.5 mg daily for now. Would consider possibly switching to a GLP1 RA if needed for  better diabetes control.  3. Screening for STD (sexually transmitted disease)  - Chlamydia/GC NAA, Confirmation - HIV Antibody (routine testing w rflx) - RPR   Return in about 3 months (around 07/01/2022) for Reassessment.   Loyola Mast, MD

## 2022-04-01 LAB — HIV ANTIBODY (ROUTINE TESTING W REFLEX): HIV 1&2 Ab, 4th Generation: NONREACTIVE

## 2022-04-01 LAB — RPR: RPR Ser Ql: NONREACTIVE

## 2022-04-01 MED ORDER — ATORVASTATIN CALCIUM 20 MG PO TABS: 20.0000 mg | ORAL_TABLET | Freq: Every day | ORAL | 3 refills | Status: DC

## 2022-04-04 LAB — CHLAMYDIA/GC NAA, CONFIRMATION
Chlamydia trachomatis, NAA: NEGATIVE
Neisseria gonorrhoeae, NAA: NEGATIVE

## 2022-08-23 ENCOUNTER — Encounter: Payer: Self-pay | Admitting: Family Medicine

## 2022-08-23 ENCOUNTER — Ambulatory Visit (INDEPENDENT_AMBULATORY_CARE_PROVIDER_SITE_OTHER): Payer: Self-pay | Admitting: Family Medicine

## 2022-08-23 VITALS — BP 120/70 | HR 101 | Temp 97.2°F | Ht 64.0 in | Wt 221.0 lb

## 2022-08-23 DIAGNOSIS — E119 Type 2 diabetes mellitus without complications: Secondary | ICD-10-CM

## 2022-08-23 DIAGNOSIS — E782 Mixed hyperlipidemia: Secondary | ICD-10-CM

## 2022-08-23 DIAGNOSIS — Z113 Encounter for screening for infections with a predominantly sexual mode of transmission: Secondary | ICD-10-CM

## 2022-08-23 DIAGNOSIS — L309 Dermatitis, unspecified: Secondary | ICD-10-CM

## 2022-08-23 MED ORDER — ATORVASTATIN CALCIUM 20 MG PO TABS
20.0000 mg | ORAL_TABLET | Freq: Every day | ORAL | 3 refills | Status: DC
Start: 2022-08-23 — End: 2023-04-13

## 2022-08-23 NOTE — Progress Notes (Signed)
[facility] [address] [place] [address] Dept: [phone] Dept Fax: [phone]  Office Visit  Subjective:    Patient ID: Monique Jackson, female    DOB: 18-May-1996, 26 y.o..   MRN: [id number]  Chief Complaint  Patient presents with   Acute Visit    C/o having some pelvic pain x 4 days. Snd has a bite on the upper RT leg.  Wants routine STD testing.     History of Present Illness:  Patient is in today for noting she had some recent pelvic discomfort. She notes at this point, the discomfort has resolved. Around the time this developed, Ms. Feng had noted an area of rash on her right hip area. This appears to be clearing up some at this point. She did not note any drainage from this issue.   Ms. Sankey was diagnosed with Type 2 diabetes this past winter. She is managed on metformin 1000 mg daily, though she admits that she is not consistent in taking her medication. She has not had her eye exam in the past year. She plans to pursue this after the 1st of the year. She ahs been workign at weight loss, primarily through reducing intake.   Ms. Abdullah has hyperlipidemia. She is managed on atorvastatin 20 mg daily.  Ms. Grelle requests STI screening. She prefers to have this screened about every 6 months due to ongoing high-risk sexual activities.  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 No pertinent surgical history.  Family History  Problem Relation Age of Onset   Heart disease Mother    Stroke Mother     Diabetes Mother    Hypertension Mother    Thyroid disease Mother    Diabetes Father    Hypertension Maternal Grandmother    Gout Maternal Grandmother    Cancer Paternal Grandmother        Pancreatic   Pancreatic cancer Paternal Grandmother    Outpatient Medications Prior to Visit  Medication Sig Dispense Refill   metFORMIN (GLUMETZA) 1000 MG (MOD) 24 hr tablet Take 1 tablet (1,000 mg total) by mouth daily with breakfast. 30 tablet 5   phentermine (ADIPEX-P) 37.5 MG tablet Take 1 tablet (37.5 mg total) by mouth daily before breakfast. 30 tablet 0   atorvastatin (LIPITOR) 20 MG tablet Take 1 tablet (20 mg total) by mouth daily. 90 tablet 3   No facility-administered medications prior to visit.   No Known Allergies    Objective:   Today's Vitals   08/23/22 1420  BP: 120/70  Pulse: (!) 101  Temp: (!) 97.2 F (36.2 C)  TempSrc: Temporal  SpO2: 99%  Weight: 221 lb (100.2 kg)  Height: 5\' 4"  (1.626 m)   Body mass index is 37.93 kg/m.   General: Well developed, well nourished. No acute distress. Skin: Warm and dry. There is a 2 cm wound area of scaly/excoriated rash on the lateral right hip/upper   thigh. There is no redness. This is mildly tender to palpation. No drainage or induration noted. Psych: Alert and oriented. Normal mood and affect.  Health Maintenance Due  Topic Date Due   OPHTHALMOLOGY EXAM  Never done     Assessment & Plan:   1. Type 2 diabetes mellitus without complication, without long-term current use of insulin (HCC) I will reassess her A1c. We would plan to continue her on metformin 1,000 mg daily.  - Hemoglobin A1c  2. Mixed hyperlipidemia Continue atorvastatin.  - atorvastatin (LIPITOR) 20 MG tablet; Take 1 tablet (20 mg total) by mouth daily.  Dispense: 90 tablet; Refill: 3  3. Screening for STD (sexually transmitted disease) We will conduct routine STD screening at patient's request.  - Chlamydia/GC NAA, Confirmation - HIV Antibody  (routine testing w rflx) - [facility]PR  4. Dermatitis Rash likely is a localized area of eczema. I recommend she apply lotions and monitor this for now.  Return in about 3 months (around 11/22/2022) for Reassessment.   Loyola Mast, MD

## 2022-08-24 LAB — HIV ANTIBODY (ROUTINE TESTING W REFLEX): HIV 1&2 Ab, 4th Generation: NONREACTIVE

## 2022-08-24 LAB — HEMOGLOBIN A1C: Hgb A1c MFr Bld: 6.2 % (ref 4.6–6.5)

## 2022-08-24 LAB — RPR: RPR Ser Ql: NONREACTIVE

## 2022-08-26 LAB — CHLAMYDIA/GC NAA, CONFIRMATION
Chlamydia trachomatis, NAA: NEGATIVE
Neisseria gonorrhoeae, NAA: NEGATIVE

## 2022-09-06 NOTE — L&D Delivery Note (Signed)
Operative Note  PROCEDURE DATE: 04/09/23   PREOPERATIVE DIAGNOSIS: chronic hypertension with superimposed preeclampsia with severe features.  Fetal intolerance to labor.   POSTOPERATIVE DIAGNOSIS: The same   PROCEDURE:    Urgent Primary Low Transverse Cesarean Section   SURGEON:  Dr. Nilda Simmer ASSISTANT: Dr. Clance Boll  An experienced assistant was required given the standard of surgical care given the complexity of the case.  This assistant was needed for exposure, dissection, suctioning, retraction, instrument exchange, assisting with delivery with administration of fundal pressure, and for overall help during the procedure.    INDICATIONS: This is a 27 y.o. yo G1 at [redacted]w[redacted]d requiring cesarean section secondary to fetal intolerance of labor. She was undergoing induction for cHTN with SIPE with new onset severe features of severe range BP. She was on maximum doses of oral antihypertensives. Her induction was complicated by new onset repetitive late decelerations that did not resolve with position changes or fluids. Minimal variability was also noted.  Decision made to proceed with urgent LTCS. The risks of cesarean section discussed with the patient included but were not limited to: bleeding which may require transfusion or reoperation; infection which may require antibiotics; injury to bowel, bladder, ureters or other surrounding organs; injury to the fetus; need for additional procedures including hysterectomy in the event of a life-threatening hemorrhage; placental abnormalities wth subsequent pregnancies, incisional problems, thromboembolic phenomenon and other postoperative/anesthesia complications. The patient agreed with the proposed plan, giving informed consent for the procedure.     FINDINGS:  Viable female infant in vertex presentation, APGARs 8, 9,  Weight pending, Amniotic fluid clear,  Intact placenta, three vessel cord.  Grossly normal uterus  .   ANESTHESIA:     Epidural ESTIMATED BLOOD LOSS: 213 cc SPECIMENS: Placenta for path (preeclampsia) COMPLICATIONS: None immediate    PROCEDURE IN DETAIL:  The patient received intravenous antibiotics (2g Ancef and azithromycin) and had sequential compression devices applied to her lower extremities while in the preoperative area.  She was then taken to the operating room where spinal anesthesia was dosed up to surgical level and was found to be adequate. She was then placed in a dorsal supine position with a leftward tilt, and prepped and draped in a sterile manner. Prior to prep, fetal heart tones were not able to be detected.  Decision was made to betadine splash for prep and proceed in stat fashion.  A foley catheter was placed into her bladder and attached to constant gravity.  After an adequate timeout was performed, a Pfannenstiel skin incision was made with scalpel and carried through to the underlying layer of fascia. The fascia was incised in the midline and this incision was extended bilaterally bluntly, as was the rectus and peritoneum.  A transverse hysterotomy was made with a scalpel and extended bilaterally bluntly. The bladder blade was then removed. The infant was successfully delivered, and cord was clamped and cut and infant was handed over to awaiting neonatology team. Uterine massage was then administered and the placenta delivered intact with three-vessel cord. Cord gases collected. The uterus was cleared of clot and debris.  The hysterotomy was closed with 0 vicryl.  A second imbricating suture of 0-vicryl was used to reinforce the incision and aid in hemostasis. Arista was applied. The fascia was closed with 0-Vicryl in a running fashion with good restoration of anatomy.  The subcutaneus tissue was irrigated and was reapproximated using running plain gut.  The skin was closed with 4-0 Vicryl in a subcuticular fashion.  All surgical site and was hemostatic at end of procedure without any further bleeding  on exam.    Pt tolerated the procedure well. All sponge/lap/needle counts were correct  X 2. Pt taken to recovery room in stable condition.     Nilda Simmer MD

## 2022-09-10 ENCOUNTER — Emergency Department
Admission: EM | Admit: 2022-09-10 | Discharge: 2022-09-10 | Disposition: A | Discharge status: Home / Self Care | Payer: Self-pay | Attending: Emergency Medicine | Admitting: Emergency Medicine

## 2022-09-10 DIAGNOSIS — O209 Hemorrhage in early pregnancy, unspecified: Secondary | ICD-10-CM | POA: Insufficient documentation

## 2022-09-10 NOTE — ED Provider Notes (Signed)
[facility] EMERGENCY DEPARTMENT H&P         CLINICAL INFORMATION        HPI:      Chief Complaint: No chief complaint on file.  Stacy Cannon is a 27 y.o. female who presents with abd cramping and vaginal bleeding.      History obtained from: Patient          ROS:      Positive and negative ROS elements as per HPI.  All other systems reviewed and negative.      Physical Exam:      Pulse 85  BP 118/78  Resp 20  SpO2 100 %  Temp 98.3 F (36.8 C)    Physical Exam  Constitutional:       General: She is not in acute distress.     Appearance: She is well-developed. She is not diaphoretic.   HENT:      Head: Normocephalic and atraumatic.   Eyes:      Conjunctiva/sclera: Conjunctivae normal.      Pupils: Pupils are equal, round, and reactive to light.   Cardiovascular:      Rate and Rhythm: Normal rate and regular rhythm.      Heart sounds: Normal heart sounds.   Pulmonary:      Effort: Pulmonary effort is normal. No respiratory distress.      Breath sounds: Normal breath sounds. No wheezing.   Abdominal:      General: Bowel sounds are normal.      Palpations: Abdomen is soft.      Tenderness: There is no abdominal tenderness.   Musculoskeletal:         General: No tenderness. Normal range of motion.   Skin:     General: Skin is warm and dry.   Neurological:      Mental Status: She is alert and oriented to person, place, and time.      Cranial Nerves: No cranial nerve deficit.   Psychiatric:         Behavior: Behavior normal.         Thought Content: Thought content normal.         Judgment: Judgment normal.                  PAST HISTORY        Primary Care Provider: Pcp, None, MD        PMH/PSH:    .     No past medical history on file.    She has no past surgical history on file.      Social/Family History:      She has no history on file for tobacco use, alcohol use, and drug use.    No family history on file.      Listed Medications on Arrival:    .     Home Medications    None  on File        Allergies: She has no allergies on file.           Visit date: 09/10/2022      CLINICAL SUMMARY          Diagnosis:    .     Final diagnoses:   Vaginal bleeding affecting early pregnancy         MDM Notes:        DDx-              Initial differential diagnosis  included: SAB, threatened SAB      Patient came in today with vaginal bleeding in pregnancy. Upon notifying the patient our facility does not offer the appropriate imaging, patient elected to seek care elsewhere. Medical screening encounter only.                VISIT INFORMATION        Clinical Course in the ED:                 Medications Given in the ED:    .     ED Medication Orders (From admission, onward)      None              Procedures:      Procedures      Interpretations:                         RESULTS        Lab Results:      Results       ** No results found for the last 24 hours. **                Radiology Results:          No orders to display           Disposition:              Patient received a medical screening exam after declining further evaluation and treatment at this emergency department.  No emergency medical condition identified.  No charge for this visit.            Scribe Attestation:      I was acting as a Education administrator for Cassell Clement, MD on [facility]       I am the first provider for this patient and I personally performed the services documented.  is scribing for me on [employer]. This note and the patient instructions accurately reflect work and decisions made by me.  Cassell Clement, MD            Royden Purl, MD  09/21/22 [phone]

## 2022-09-15 NOTE — Patient Instructions (Signed)
Common Medications Safe in Pregnancy  Acne:      Constipation:  Benzoyl Peroxide     Colace  Clindamycin      Dulcolax Suppository  Topica Erythromycin     Fibercon  Salicylic Acid      Metamucil         Miralax AVOID:        Senakot   Accutane    Cough:  Retin-A       Cough Drops  Tetracycline      Phenergan w/ Codeine if Rx  Minocycline      Robitussin (Plain & DM)  Antibiotics:     Crabs/Lice:  Ceclor       RID  Cephalosporins    AVOID:  E-Mycins      Kwell  Keflex  Macrobid/Macrodantin   Diarrhea:  Penicillin      Kao-Pectate  Zithromax      Imodium AD         PUSH FLUIDS AVOID:       Cipro     Fever:  Tetracycline      Tylenol (Regular or Extra  Minocycline       Strength)  Levaquin      Extra Strength-Do not          Exceed 8 tabs/24 hrs Caffeine:        <200mg/day (equiv. To 1 cup of coffee or  approx. 3 12 oz sodas)         Gas: Cold/Hayfever:       Gas-X  Benadryl      Mylicon  Claritin       Phazyme  **Claritin-D        Chlor-Trimeton    Headaches:  Dimetapp      ASA-Free Excedrin  Drixoral-Non-Drowsy     Cold Compress  Mucinex (Guaifenasin)     Tylenol (Regular or Extra  Sudafed/Sudafed-12 Hour     Strength)  **Sudafed PE Pseudoephedrine   Tylenol Cold & Sinus     Vicks Vapor Rub  Zyrtec  **AVOID if Problems With Blood Pressure         Heartburn: Avoid lying down for at least 1 hour after meals  Aciphex      Maalox     Rash:  Milk of Magnesia     Benadryl    Mylanta       1% Hydrocortisone Cream  Pepcid  Pepcid Complete   Sleep Aids:  Prevacid      Ambien   Prilosec       Benadryl  Rolaids       Chamomile Tea  Tums (Limit 4/day)     Unisom         Tylenol PM         Warm milk-add vanilla or  Hemorrhoids:       Sugar for taste  Anusol/Anusol H.C.  (RX: Analapram 2.5%)  Sugar Substitutes:  Hydrocortisone OTC     Ok in moderation  Preparation H      Tucks        Vaseline lotion applied to tissue with  wiping    Herpes:     Throat:  Acyclovir      Oragel  Famvir  Valtrex     Vaccines:         Flu Shot Leg Cramps:       *Gardasil  Benadryl      Hepatitis A         Hepatitis B Nasal Spray:         Pneumovax  Saline Nasal Spray     Polio Booster         Tetanus Nausea:       Tuberculosis test or PPD  Vitamin B6 25 mg TID   AVOID:    Dramamine      *Gardasil  Emetrol       Live Poliovirus  Ginger Root 250 mg QID    MMR (measles, mumps &  High Complex Carbs @ Bedtime    rebella)  Sea Bands-Accupressure    Varicella (Chickenpox)  Unisom 1/2 tab TID     *No known complications           If received before Pain:         Known pregnancy;   Darvocet       Resume series after  Lortab        Delivery  Percocet    Yeast:   Tramadol      Femstat  Tylenol 3      Gyne-lotrimin  Ultram       Monistat  Vicodin           MISC:         All Sunscreens           Hair Coloring/highlights          Insect Repellant's          (Including DEET)         Mystic Tans First Trimester of Pregnancy  The first trimester of pregnancy starts on the first day of your last menstrual period until the end of week 12. This is also called months 1 through 3 of pregnancy. Body changes during your first trimester Your body goes through many changes during pregnancy. The changes usually return to normal after your baby is born. Physical changes You may gain or lose weight. Your breasts may grow larger and hurt. The area around your nipples may get darker. Dark spots or blotches may develop on your face. You may have changes in your hair. Health changes You may feel like you might vomit (nauseous), and you may vomit. You may have heartburn. You may have headaches. You may have trouble pooping (constipation). Your gums may bleed. Other changes You may get tired easily. You may pee (urinate) more often. Your menstrual periods will stop. You may not feel hungry. You may want to eat certain kinds of food. You  may have changes in your emotions from day to day. You may have more dreams. Follow these instructions at home: Medicines Take over-the-counter and prescription medicines only as told by your doctor. Some medicines are not safe during pregnancy. Take a prenatal vitamin that contains at least 600 micrograms (mcg) of folic acid. Eating and drinking Eat healthy meals that include: Fresh fruits and vegetables. Whole grains. Good sources of protein, such as meat, eggs, or tofu. Low-fat dairy products. Avoid raw meat and unpasteurized juice, milk, and cheese. If you feel like you may vomit, or you vomit: Eat 4 or 5 small meals a day instead of 3 large meals. Try eating a few soda crackers. Drink liquids between meals instead of during meals. You may need to take these actions to prevent or treat trouble pooping: Drink enough fluids to keep your pee (urine) pale yellow. Eat foods that are high in fiber. These include beans, whole grains, and fresh fruits and vegetables. Limit foods that are high in fat and sugar. These include fried or sweet foods. Activity Exercise only as   told by your doctor. Most people can do their usual exercise routine during pregnancy. Stop exercising if you have cramps or pain in your lower belly (abdomen) or low back. Do not exercise if it is too hot or too humid, or if you are in a place of great height (high altitude). Avoid heavy lifting. If you choose to, you may have sex unless your doctor tells you not to. Relieving pain and discomfort Wear a good support bra if your breasts are sore. Rest with your legs raised (elevated) if you have leg cramps or low back pain. If you have bulging veins (varicose veins) in your legs: Wear support hose as told by your doctor. Raise your feet for 15 minutes, 3-4 times a day. Limit salt in your food. Safety Wear your seat belt at all times when you are in a car. Talk with your doctor if someone is hurting you or yelling at  you. Talk with your doctor if you are feeling sad or have thoughts of hurting yourself. Lifestyle Do not use hot tubs, steam rooms, or saunas. Do not douche. Do not use tampons or scented sanitary pads. Do not use herbal medicines, illegal drugs, or medicines that are not approved by your doctor. Do not drink alcohol. Do not smoke or use any products that contain nicotine or tobacco. If you need help quitting, ask your doctor. Avoid cat litter boxes and soil that is used by cats. These carry germs that can cause harm to the baby and can cause a loss of your baby by miscarriage or stillbirth. General instructions Keep all follow-up visits. This is important. Ask for help if you need counseling or if you need help with nutrition. Your doctor can give you advice or tell you where to go for help. Visit your dentist. At home, brush your teeth with a soft toothbrush. Floss gently. Write down your questions. Take them to your prenatal visits. Where to find more information American Pregnancy Association: americanpregnancy.org American College of Obstetricians and Gynecologists: www.acog.org Office on Women's Health: womenshealth.gov/pregnancy Contact a doctor if: You are dizzy. You have a fever. You have mild cramps or pressure in your lower belly. You have a nagging pain in your belly area. You continue to feel like you may vomit, you vomit, or you have watery poop (diarrhea) for 24 hours or longer. You have a bad-smelling fluid coming from your vagina. You have pain when you pee. You are exposed to a disease that spreads from person to person, such as chickenpox, measles, Zika virus, HIV, or hepatitis. Get help right away if: You have spotting or bleeding from your vagina. You have very bad belly cramping or pain. You have shortness of breath or chest pain. You have any kind of injury, such as from a fall or a car crash. You have new or increased pain, swelling, or redness in an arm or  leg. Summary The first trimester of pregnancy starts on the first day of your last menstrual period until the end of week 12 (months 1 through 3). Eat 4 or 5 small meals a day instead of 3 large meals. Do not smoke or use any products that contain nicotine or tobacco. If you need help quitting, ask your doctor. Keep all follow-up visits. This information is not intended to replace advice given to you by your health care provider. Make sure you discuss any questions you have with your health care provider. Document Revised: 01/30/2020 Document Reviewed: 12/06/2019 Elsevier Patient Education    2023 Elsevier Inc. Commonly Asked Questions During Pregnancy  Cats: A parasite can be excreted in cat feces.  To avoid exposure you need to have another person empty the little box.  If you must empty the litter box you will need to wear gloves.  Wash your hands after handling your cat.  This parasite can also be found in raw or undercooked meat so this should also be avoided.  Colds, Sore Throats, Flu: Please check your medication sheet to see what you can take for symptoms.  If your symptoms are unrelieved by these medications please call the office.  Dental Work: Most any dental work your dentist recommends is permitted.  X-rays should only be taken during the first trimester if absolutely necessary.  Your abdomen should be shielded with a lead apron during all x-rays.  Please notify your provider prior to receiving any x-rays.  Novocaine is fine; gas is not recommended.  If your dentist requires a note from us prior to dental work please call the office and we will provide one for you.  Exercise: Exercise is an important part of staying healthy during your pregnancy.  You may continue most exercises you were accustomed to prior to pregnancy.  Later in your pregnancy you will most likely notice you have difficulty with activities requiring balance like riding a bicycle.  It is important that you listen to  your body and avoid activities that put you at a higher risk of falling.  Adequate rest and staying well hydrated are a must!  If you have questions about the safety of specific activities ask your provider.    Exposure to Children with illness: Try to avoid obvious exposure; report any symptoms to us when noted,  If you have chicken pos, red measles or mumps, you should be immune to these diseases.   Please do not take any vaccines while pregnant unless you have checked with your OB provider.  Fetal Movement: After 28 weeks we recommend you do "kick counts" twice daily.  Lie or sit down in a calm quiet environment and count your baby movements "kicks".  You should feel your baby at least 10 times per hour.  If you have not felt 10 kicks within the first hour get up, walk around and have something sweet to eat or drink then repeat for an additional hour.  If count remains less than 10 per hour notify your provider.  Fumigating: Follow your pest control agent's advice as to how long to stay out of your home.  Ventilate the area well before re-entering.  Hemorrhoids:   Most over-the-counter preparations can be used during pregnancy.  Check your medication to see what is safe to use.  It is important to use a stool softener or fiber in your diet and to drink lots of liquids.  If hemorrhoids seem to be getting worse please call the office.   Hot Tubs:  Hot tubs Jacuzzis and saunas are not recommended while pregnant.  These increase your internal body temperature and should be avoided.  Intercourse:  Sexual intercourse is safe during pregnancy as long as you are comfortable, unless otherwise advised by your provider.  Spotting may occur after intercourse; report any bright red bleeding that is heavier than spotting.  Labor:  If you know that you are in labor, please go to the hospital.  If you are unsure, please call the office and let us help you decide what to do.  Lifting, straining, etc:  If your   job  requires heavy lifting or straining please check with your provider for any limitations.  Generally, you should not lift items heavier than that you can lift simply with your hands and arms (no back muscles)  Painting:  Paint fumes do not harm your pregnancy, but may make you ill and should be avoided if possible.  Latex or water based paints have less odor than oils.  Use adequate ventilation while painting.  Permanents & Hair Color:  Chemicals in hair dyes are not recommended as they cause increase hair dryness which can increase hair loss during pregnancy.  " Highlighting" and permanents are allowed.  Dye may be absorbed differently and permanents may not hold as well during pregnancy.  Sunbathing:  Use a sunscreen, as skin burns easily during pregnancy.  Drink plenty of fluids; avoid over heating.  Tanning Beds:  Because their possible side effects are still unknown, tanning beds are not recommended.  Ultrasound Scans:  Routine ultrasounds are performed at approximately 20 weeks.  You will be able to see your baby's general anatomy an if you would like to know the gender this can usually be determined as well.  If it is questionable when you conceived you may also receive an ultrasound early in your pregnancy for dating purposes.  Otherwise ultrasound exams are not routinely performed unless there is a medical necessity.  Although you can request a scan we ask that you pay for it when conducted because insurance does not cover " patient request" scans.  Work: If your pregnancy proceeds without complications you may work until your due date, unless your physician or employer advises otherwise.  Round Ligament Pain/Pelvic Discomfort:  Sharp, shooting pains not associated with bleeding are fairly common, usually occurring in the second trimester of pregnancy.  They tend to be worse when standing up or when you remain standing for long periods of time.  These are the result of pressure of certain  pelvic ligaments called "round ligaments".  Rest, Tylenol and heat seem to be the most effective relief.  As the womb and fetus grow, they rise out of the pelvis and the discomfort improves.  Please notify the office if your pain seems different than that described.  It may represent a more serious condition.  Morning Sickness  Morning sickness is when you feel like you may vomit (feel nauseous) during pregnancy. Sometimes, you may vomit. Morning sickness most often happens in the morning, but it can also happen at any time of the day. Some women may have morning sickness that makes them vomit all the time. This is a more serious problem that needs treatment. What are the causes? The cause of this condition is not known. What increases the risk? You had vomiting or a feeling like you may vomit before your pregnancy. You had morning sickness in another pregnancy. You are pregnant with more than one baby, such as twins. What are the signs or symptoms? Feeling like you may vomit. Vomiting. How is this treated? Treatment is usually not needed for this condition. You may only need to change what you eat. In some cases, your doctor may give you some things to take for your condition. These include: Vitamin B6 supplements. Medicines to treat the feeling that you may vomit. Ginger. Follow these instructions at home: Medicines Take over-the-counter and prescription medicines only as told by your doctor. Do not take any medicines until you talk with your doctor about them first. Take multivitamins before you get pregnant. These   can stop or lessen the symptoms of morning sickness. Eating and drinking Eat dry toast or crackers before getting out of bed. Eat 5 or 6 small meals a day. Eat dry and bland foods like rice and baked potatoes. Do not eat greasy, fatty, or spicy foods. Have someone cook for you if the smell of food causes you to vomit or to feel like you may vomit. If you feel like you may  vomit after taking prenatal vitamins, take them at night or with a snack. Eat protein foods when you need a snack. Nuts, yogurt, and cheese are good choices. Drink fluids throughout the day. Try ginger ale made with real ginger, ginger tea made from fresh grated ginger, or ginger candies. General instructions Do not smoke or use any products that contain nicotine or tobacco. If you need help quitting, ask your doctor. Use an air purifier to keep the air in your house free of smells. Get lots of fresh air. Try to avoid smells that make you feel sick. Try wearing an acupressure wristband. This is a wristband that is used to treat seasickness. Try a treatment called acupuncture. In this treatment, a doctor puts needles into certain areas of your body to make you feel better. Contact a doctor if: You need medicine to feel better. You feel dizzy or light-headed. You are losing weight. Get help right away if: The feeling that you may vomit will not go away, or you cannot stop vomiting. You faint. You have very bad pain in your belly. Summary Morning sickness is when you feel like you may vomit (feel nauseous) during pregnancy. You may feel sick in the morning, but you can feel this way at any time of the day. Making some changes to what you eat may help your symptoms go away. This information is not intended to replace advice given to you by your health care provider. Make sure you discuss any questions you have with your health care provider. Document Revised: 04/07/2020 Document Reviewed: 03/17/2020 Elsevier Patient Education  2023 Elsevier Inc.  

## 2022-09-15 NOTE — Progress Notes (Unsigned)
    NURSE VISIT NOTE  Subjective:    Patient ID: Monique Jackson, female    DOB: 01/10/1996, 27 y.o.   MRN: [id number]  HPI  Patient is a 27 y.o. G0P0000 female who presents for Patient presents today for pregnancy confirmation. UPT resulted in ***. LMP ***. (*** if positive) She has been advised to schedule a NOB Nurse Interview at checkout.   Patient states no other questions or concerns.   {Common ambulatory SmartLinks:19316}  Review of Systems {ros; complete:30496}   Objective:   There were no vitals taken for this visit. There is no height or weight on file to calculate BMI. General appearance: {general exam:16600} Abdomen: {abdominal exam:16834} Pelvic: {pelvic exam:16852::"cervix normal in appearance","external genitalia normal","no adnexal masses or tenderness","no cervical motion tenderness","rectovaginal septum normal","uterus normal size, shape, and consistency","vagina normal without discharge"} Extremities: {extremity exam:5109} Neurologic: {neuro exam:17854}   Assessment:   No diagnosis found.   Plan:   There are no diagnoses linked to this encounter.

## 2022-09-16 ENCOUNTER — Ambulatory Visit: Payer: Self-pay

## 2022-09-16 DIAGNOSIS — N912 Amenorrhea, unspecified: Secondary | ICD-10-CM

## 2022-10-07 DIAGNOSIS — Z3685 Encounter for antenatal screening for Streptococcus B: Secondary | ICD-10-CM | POA: Diagnosis not present

## 2022-10-07 DIAGNOSIS — Z3481 Encounter for supervision of other normal pregnancy, first trimester: Secondary | ICD-10-CM | POA: Diagnosis not present

## 2022-10-07 DIAGNOSIS — Z31430 Encounter of female for testing for genetic disease carrier status for procreative management: Secondary | ICD-10-CM | POA: Diagnosis not present

## 2022-10-07 DIAGNOSIS — Z3401 Encounter for supervision of normal first pregnancy, first trimester: Secondary | ICD-10-CM | POA: Diagnosis not present

## 2022-10-07 LAB — OB RESULTS CONSOLE HEPATITIS B SURFACE ANTIGEN: Hepatitis B Surface Ag: NEGATIVE

## 2022-10-07 LAB — OB RESULTS CONSOLE RPR: RPR: NONREACTIVE

## 2022-10-07 LAB — OB RESULTS CONSOLE RUBELLA ANTIBODY, IGM: Rubella: IMMUNE

## 2022-10-07 LAB — OB RESULTS CONSOLE ANTIBODY SCREEN: Antibody Screen: NEGATIVE

## 2022-10-07 LAB — OB RESULTS CONSOLE ABO/RH: RH Type: POSITIVE

## 2022-10-07 LAB — OB RESULTS CONSOLE HIV ANTIBODY (ROUTINE TESTING): HIV: NONREACTIVE

## 2022-10-07 LAB — HEPATITIS C ANTIBODY: HCV Ab: NEGATIVE

## 2022-10-14 DIAGNOSIS — Z124 Encounter for screening for malignant neoplasm of cervix: Secondary | ICD-10-CM | POA: Diagnosis not present

## 2022-10-14 DIAGNOSIS — R87610 Atypical squamous cells of undetermined significance on cytologic smear of cervix (ASC-US): Secondary | ICD-10-CM | POA: Diagnosis not present

## 2022-10-14 DIAGNOSIS — O169 Unspecified maternal hypertension, unspecified trimester: Secondary | ICD-10-CM | POA: Diagnosis not present

## 2022-10-18 DIAGNOSIS — R69 Illness, unspecified: Secondary | ICD-10-CM | POA: Diagnosis not present

## 2022-10-21 DIAGNOSIS — Z3491 Encounter for supervision of normal pregnancy, unspecified, first trimester: Secondary | ICD-10-CM | POA: Diagnosis not present

## 2022-10-29 DIAGNOSIS — Z3A12 12 weeks gestation of pregnancy: Secondary | ICD-10-CM | POA: Diagnosis not present

## 2022-10-29 DIAGNOSIS — O169 Unspecified maternal hypertension, unspecified trimester: Secondary | ICD-10-CM | POA: Diagnosis not present

## 2022-10-29 DIAGNOSIS — R300 Dysuria: Secondary | ICD-10-CM | POA: Diagnosis not present

## 2022-10-29 DIAGNOSIS — Z3401 Encounter for supervision of normal first pregnancy, first trimester: Secondary | ICD-10-CM | POA: Diagnosis not present

## 2022-11-03 DIAGNOSIS — F4323 Adjustment disorder with mixed anxiety and depressed mood: Secondary | ICD-10-CM | POA: Diagnosis not present

## 2022-11-04 DIAGNOSIS — M62830 Muscle spasm of back: Secondary | ICD-10-CM | POA: Diagnosis not present

## 2022-11-04 DIAGNOSIS — M5417 Radiculopathy, lumbosacral region: Secondary | ICD-10-CM | POA: Diagnosis not present

## 2022-11-04 DIAGNOSIS — R51 Headache with orthostatic component, not elsewhere classified: Secondary | ICD-10-CM | POA: Diagnosis not present

## 2022-11-04 DIAGNOSIS — M546 Pain in thoracic spine: Secondary | ICD-10-CM | POA: Diagnosis not present

## 2022-11-04 DIAGNOSIS — M5413 Radiculopathy, cervicothoracic region: Secondary | ICD-10-CM | POA: Diagnosis not present

## 2022-11-04 DIAGNOSIS — M7912 Myalgia of auxiliary muscles, head and neck: Secondary | ICD-10-CM | POA: Diagnosis not present

## 2022-11-04 DIAGNOSIS — Z3481 Encounter for supervision of other normal pregnancy, first trimester: Secondary | ICD-10-CM | POA: Diagnosis not present

## 2022-11-04 DIAGNOSIS — M5412 Radiculopathy, cervical region: Secondary | ICD-10-CM | POA: Diagnosis not present

## 2022-11-04 DIAGNOSIS — M5414 Radiculopathy, thoracic region: Secondary | ICD-10-CM | POA: Diagnosis not present

## 2022-11-06 DIAGNOSIS — M5417 Radiculopathy, lumbosacral region: Secondary | ICD-10-CM | POA: Diagnosis not present

## 2022-11-06 DIAGNOSIS — M546 Pain in thoracic spine: Secondary | ICD-10-CM | POA: Diagnosis not present

## 2022-11-06 DIAGNOSIS — M62830 Muscle spasm of back: Secondary | ICD-10-CM | POA: Diagnosis not present

## 2022-11-06 DIAGNOSIS — M5412 Radiculopathy, cervical region: Secondary | ICD-10-CM | POA: Diagnosis not present

## 2022-11-06 DIAGNOSIS — M5413 Radiculopathy, cervicothoracic region: Secondary | ICD-10-CM | POA: Diagnosis not present

## 2022-11-06 DIAGNOSIS — M7912 Myalgia of auxiliary muscles, head and neck: Secondary | ICD-10-CM | POA: Diagnosis not present

## 2022-11-06 DIAGNOSIS — M5414 Radiculopathy, thoracic region: Secondary | ICD-10-CM | POA: Diagnosis not present

## 2022-11-06 DIAGNOSIS — R51 Headache with orthostatic component, not elsewhere classified: Secondary | ICD-10-CM | POA: Diagnosis not present

## 2022-11-18 DIAGNOSIS — M5413 Radiculopathy, cervicothoracic region: Secondary | ICD-10-CM | POA: Diagnosis not present

## 2022-11-18 DIAGNOSIS — Z361 Encounter for antenatal screening for raised alphafetoprotein level: Secondary | ICD-10-CM | POA: Diagnosis not present

## 2022-11-18 DIAGNOSIS — F4323 Adjustment disorder with mixed anxiety and depressed mood: Secondary | ICD-10-CM | POA: Diagnosis not present

## 2022-11-18 DIAGNOSIS — M7912 Myalgia of auxiliary muscles, head and neck: Secondary | ICD-10-CM | POA: Diagnosis not present

## 2022-11-18 DIAGNOSIS — R51 Headache with orthostatic component, not elsewhere classified: Secondary | ICD-10-CM | POA: Diagnosis not present

## 2022-11-18 DIAGNOSIS — M5414 Radiculopathy, thoracic region: Secondary | ICD-10-CM | POA: Diagnosis not present

## 2022-11-18 DIAGNOSIS — M546 Pain in thoracic spine: Secondary | ICD-10-CM | POA: Diagnosis not present

## 2022-11-18 DIAGNOSIS — M62830 Muscle spasm of back: Secondary | ICD-10-CM | POA: Diagnosis not present

## 2022-11-18 DIAGNOSIS — M5417 Radiculopathy, lumbosacral region: Secondary | ICD-10-CM | POA: Diagnosis not present

## 2022-11-18 DIAGNOSIS — M5412 Radiculopathy, cervical region: Secondary | ICD-10-CM | POA: Diagnosis not present

## 2022-11-25 DIAGNOSIS — F4323 Adjustment disorder with mixed anxiety and depressed mood: Secondary | ICD-10-CM | POA: Diagnosis not present

## 2022-12-09 DIAGNOSIS — M7912 Myalgia of auxiliary muscles, head and neck: Secondary | ICD-10-CM | POA: Diagnosis not present

## 2022-12-09 DIAGNOSIS — Z3402 Encounter for supervision of normal first pregnancy, second trimester: Secondary | ICD-10-CM | POA: Diagnosis not present

## 2022-12-09 DIAGNOSIS — R51 Headache with orthostatic component, not elsewhere classified: Secondary | ICD-10-CM | POA: Diagnosis not present

## 2022-12-09 DIAGNOSIS — M5417 Radiculopathy, lumbosacral region: Secondary | ICD-10-CM | POA: Diagnosis not present

## 2022-12-09 DIAGNOSIS — M5412 Radiculopathy, cervical region: Secondary | ICD-10-CM | POA: Diagnosis not present

## 2022-12-09 DIAGNOSIS — Z113 Encounter for screening for infections with a predominantly sexual mode of transmission: Secondary | ICD-10-CM | POA: Diagnosis not present

## 2022-12-09 DIAGNOSIS — Z3A18 18 weeks gestation of pregnancy: Secondary | ICD-10-CM | POA: Diagnosis not present

## 2022-12-09 DIAGNOSIS — M62830 Muscle spasm of back: Secondary | ICD-10-CM | POA: Diagnosis not present

## 2022-12-09 DIAGNOSIS — M5414 Radiculopathy, thoracic region: Secondary | ICD-10-CM | POA: Diagnosis not present

## 2022-12-09 DIAGNOSIS — L282 Other prurigo: Secondary | ICD-10-CM | POA: Diagnosis not present

## 2022-12-09 DIAGNOSIS — M546 Pain in thoracic spine: Secondary | ICD-10-CM | POA: Diagnosis not present

## 2022-12-09 DIAGNOSIS — M5413 Radiculopathy, cervicothoracic region: Secondary | ICD-10-CM | POA: Diagnosis not present

## 2022-12-09 LAB — OB RESULTS CONSOLE GC/CHLAMYDIA
Chlamydia: NEGATIVE
Neisseria Gonorrhea: NEGATIVE

## 2022-12-13 ENCOUNTER — Ambulatory Visit (INDEPENDENT_AMBULATORY_CARE_PROVIDER_SITE_OTHER): Payer: 59 | Admitting: Family Medicine

## 2022-12-13 ENCOUNTER — Encounter: Payer: Self-pay | Admitting: Family Medicine

## 2022-12-13 VITALS — BP 124/78 | HR 95 | Temp 98.4°F | Ht 64.0 in | Wt 226.0 lb

## 2022-12-13 DIAGNOSIS — Z3A19 19 weeks gestation of pregnancy: Secondary | ICD-10-CM | POA: Insufficient documentation

## 2022-12-13 DIAGNOSIS — A6000 Herpesviral infection of urogenital system, unspecified: Secondary | ICD-10-CM | POA: Insufficient documentation

## 2022-12-13 DIAGNOSIS — L309 Dermatitis, unspecified: Secondary | ICD-10-CM | POA: Diagnosis not present

## 2022-12-13 DIAGNOSIS — E119 Type 2 diabetes mellitus without complications: Secondary | ICD-10-CM | POA: Diagnosis not present

## 2022-12-13 DIAGNOSIS — A549 Gonococcal infection, unspecified: Secondary | ICD-10-CM | POA: Insufficient documentation

## 2022-12-13 MED ORDER — MOMETASONE FUROATE 0.1 % EX CREA
TOPICAL_CREAM | CUTANEOUS | 1 refills | Status: DC
Start: 2022-12-13 — End: 2023-06-28

## 2022-12-13 NOTE — Assessment & Plan Note (Signed)
I will try a more moderate potency steroid cream to see if this will clear up. I see no sing of a fungal infection, so do not feel she needs to use the combination product she has. I recommend she return for reassessment if signs or symptoms of a bacterial infection develop. She notes she is scheduled to see a dermatologist on Thursday.

## 2022-12-13 NOTE — Assessment & Plan Note (Signed)
EDC = 05/08/2023. She will continue to primarily follow with her obstetrician.

## 2022-12-13 NOTE — Assessment & Plan Note (Signed)
Reviewed results of recent 3-hr. GTT.  These were normal. I reassured Ms. Monique Jackson that it appears okay that she is not on diabetes medications at present. She will have repeat diabetes testing at 28 weeks.

## 2022-12-13 NOTE — Progress Notes (Signed)
[facility] [address] [place] [address] Dept: [phone] Dept Fax: [phone]  Office Visit  Subjective:    Patient ID: Monique Jackson, female    DOB: [birth year], 27 y.o..   MRN: [id number]  Chief Complaint  Patient presents with   Rash    C/o having a rash on upper RT thigh.   Has been using Triamcinolone cream.   History of Present Illness:  Patient is in today complaining of a rash on the lateral right thigh/hip. She notes this flared about 5 days ago. She has had some itching associated with this. She had tried using triamcinolone and was prescribed clotrimazole-betamethasone by her obstetrician. she feels these are not working well for the issue.  Monique Jackson is currently 39 1/[redacted] weeks pregnant. She sees Dr. Imogene Jackson for prenatal care. She notes all of her regular medications were stopped, including atorvastatin and metformin. She had an abnormal diabetes screen, but had a normal 3-hr. GTT.  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 No pertinent surgical history. Family History  Problem Relation Age of Onset   Heart disease Mother    Stroke Mother    Diabetes Mother    Hypertension Mother    Thyroid disease Mother    Diabetes Father    Hypertension Maternal Grandmother    Gout Maternal Grandmother    Cancer Paternal Grandmother        Pancreatic   Pancreatic cancer Paternal Grandmother    Outpatient Medications Prior to Visit  Medication Sig Dispense Refill    aspirin EC 81 MG tablet Take 1 tablet by mouth daily.     clotrimazole-betamethasone (LOTRISONE) cream APPLY TO THE AFFECTED AND SURROUNDING AREAS OF THE SKIN TWICE A DAY IN THE MORNING AND EVENING FOR 2 WEEKS     hydrOXYzine (VISTARIL) 50 MG capsule TAKE 1 CAPSULE BY MOUTH AS NEEDED FOR ANXIETY     labetalol (NORMODYNE) 200 MG tablet Take 1 tablet by mouth 2 (two) times daily.     atorvastatin (LIPITOR) 20 MG tablet Take 1 tablet (20 mg total) by mouth daily. (Patient not taking: Reported on 12/13/2022) 90 tablet 3   metFORMIN (GLUMETZA) 1000 MG (MOD) 24 hr tablet Take 1 tablet (1,000 mg total) by mouth daily with breakfast. (Patient not taking: Reported on 12/13/2022) 30 tablet 5   phentermine (ADIPEX-P) 37.5 MG tablet Take 1 tablet (37.5 mg total) by mouth daily before breakfast. 30 tablet 0   No facility-administered medications prior to visit.   No Known Allergies   Objective:   Today's Vitals   12/13/22 0818  BP: 124/78  Pulse: 95  Temp: 98.4 F (36.9 C)  TempSrc: Temporal  SpO2: 99%  Weight: 226 lb (102.5 kg)  Height: 5\' 4"  (1.626 m)   Body mass index is 38.79 kg/m.   General: Well developed, well nourished. No acute distress. Skin: Warm and dry. There is a 3-4 cm patch of maculopapular rash on the right lateral thigh/hop. This si excoriated.   There is no drainage and no  fluctuance. Mild erythema noted without induration. Psych: Alert and oriented. Normal mood and affect.  Health Maintenance Due  Topic Date Due   OPHTHALMOLOGY EXAM  Never done   DTaP/Tdap/Td (2 - Td or Tdap) 09/06/2022     Assessment & Plan:   Problem List Items Addressed This Visit       Endocrine   Type 2 diabetes mellitus    Reviewed results of recent 3-hr. GTT.  These were normal. I reassured Monique Jackson that it appears okay that she is not on diabetes medications at present. She will have repeat diabetes testing at 28 weeks.      Relevant Medications   aspirin EC 81 MG tablet      Musculoskeletal and Integument   Eczema - Primary    I will try a more moderate potency steroid cream to see if this will clear up. I see no sing of a fungal infection, so do not feel she needs to use the combination product she has. I recommend she return for reassessment if signs or symptoms of a bacterial infection develop. She notes she is scheduled to see a dermatologist on Thursday.      Relevant Medications   mometasone (ELOCON) 0.1 % cream     Other   [redacted] weeks gestation of pregnancy    EDC = 05/08/2023. She will continue to primarily follow with her obstetrician.       Return if symptoms worsen or fail to improve.   Loyola Mast, MD

## 2022-12-16 DIAGNOSIS — F4323 Adjustment disorder with mixed anxiety and depressed mood: Secondary | ICD-10-CM | POA: Diagnosis not present

## 2022-12-16 DIAGNOSIS — B001 Herpesviral vesicular dermatitis: Secondary | ICD-10-CM | POA: Diagnosis not present

## 2022-12-16 DIAGNOSIS — Z3402 Encounter for supervision of normal first pregnancy, second trimester: Secondary | ICD-10-CM | POA: Diagnosis not present

## 2022-12-16 DIAGNOSIS — Z3A19 19 weeks gestation of pregnancy: Secondary | ICD-10-CM | POA: Diagnosis not present

## 2022-12-16 DIAGNOSIS — Z363 Encounter for antenatal screening for malformations: Secondary | ICD-10-CM | POA: Diagnosis not present

## 2022-12-19 ENCOUNTER — Ambulatory Visit (HOSPITAL_COMMUNITY)
Admission: RE | Admit: 2022-12-19 | Discharge: 2022-12-19 | Disposition: A | Discharge status: Home / Self Care | Payer: 59 | Source: Ambulatory Visit | Attending: Internal Medicine | Admitting: Internal Medicine

## 2022-12-19 ENCOUNTER — Encounter (HOSPITAL_COMMUNITY): Payer: Self-pay

## 2022-12-19 VITALS — BP 128/83 | HR 92 | Temp 98.9°F | Resp 18 | Ht 64.0 in | Wt 231.0 lb

## 2022-12-19 DIAGNOSIS — R21 Rash and other nonspecific skin eruption: Secondary | ICD-10-CM | POA: Insufficient documentation

## 2022-12-19 DIAGNOSIS — B3731 Acute candidiasis of vulva and vagina: Secondary | ICD-10-CM | POA: Diagnosis not present

## 2022-12-19 DIAGNOSIS — O98512 Other viral diseases complicating pregnancy, second trimester: Secondary | ICD-10-CM | POA: Diagnosis not present

## 2022-12-19 DIAGNOSIS — B009 Herpesviral infection, unspecified: Secondary | ICD-10-CM | POA: Insufficient documentation

## 2022-12-19 MED ORDER — ACYCLOVIR 400 MG PO TABS: 400.0000 mg | ORAL_TABLET | Freq: Three times a day (TID) | ORAL | 0 refills | Status: AC

## 2022-12-19 MED ORDER — TERCONAZOLE 0.4 % VA CREA: 1.0000 | TOPICAL_CREAM | Freq: Every day | VAGINAL | 0 refills | Status: DC

## 2022-12-19 NOTE — Discharge Instructions (Signed)
Take acyclovir tablets by mouth every 8 hours for the next 7 days (with breakfast, lunch, and dinner) to treat your HSV-2 infection.  Use terconazole intravaginal cream once daily at bedtime.  Apply this after laying down and do not get up after applying this to the intravaginal area.    Please follow-up with your OB/GYN as needed for ongoing evaluation and for scheduled appointments for further monitoring of baby.  Your vaginal swab will come back in the next 2 to 3 days and we will call you if anything is abnormal.  If you develop any new or worsening symptoms or do not improve in the next 2 to 3 days, please return.  If your symptoms are severe, please go to the emergency room.  Follow-up with your primary care provider for further evaluation and management of your symptoms as well as ongoing wellness visits.  I hope you feel better!

## 2022-12-19 NOTE — ED Provider Notes (Signed)
[facility]    CSN: [id number] Arrival date & time: 12/19/22  1626      History   Chief Complaint Chief Complaint  Patient presents with   Vaginal Itching   Rash    HPI Monique Jackson is a 27 y.o. female.   Patient who is approximately 5 months pregnant presents to urgent care for evaluation of vaginal discharge and vaginal itching for the last 1 week. Vaginal discharge is thick and white. No vaginal odor or rash. No recent new partners or known exposure to STD. Has attempted use of monistat over the counter but states this made symptoms worse. History of type 2 diabetes. No recent antibiotic use. Denies urinary symptoms, fever/chills, and dizziness.   She would also like to be evaluated for rash to the right lateral thigh that she first noticed approximately 2 weeks ago. States she has been experiencing this type of rash since she was a child and has been told by dermatologist that rash is HSV-2 in etiology. Rash started as a raised cluster of vesicles based on patient's photo provided and has now become more red, dry, and irritated. Rash has not spread to other parts of body and is tender. No recent sick contacts with similar rash. No recent medication changes. No recent antivirals.      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 No pertinent surgical history.  OB History     Gravida  1   Para  0   Term  0   Preterm  0   AB  0   Living  0      SAB  0   IAB  0   Ectopic  0   Multiple  0   Live Births  0            Home Medications    Prior to Admission medications   Medication Sig Start Date End Date Taking? Authorizing Provider  acyclovir (ZOVIRAX) 400 MG tablet Take 1 tablet (400 mg total) by mouth 3 (three) times daily for 7 days. 12/19/22 12/26/22 Yes Carlisle Beers, FNP  aspirin EC 81 MG tablet Take 1 tablet by mouth daily.   Yes [provider]  labetalol (NORMODYNE) 200 MG tablet Take 1 tablet by mouth 2 (two) times daily.   Yes [provider]  Prenatal Vit-Fe Fumarate-FA (PRENATAL MULTIVITAMIN) TABS tablet Take 1 tablet by mouth daily at 12 noon.   Yes [provider]  terconazole (TERAZOL 7) 0.4 % vaginal cream Place 1 applicator vaginally at bedtime. 12/19/22  Yes Carlisle Beers, FNP  atorvastatin (LIPITOR) 20 MG tablet Take 1 tablet (20 mg total) by mouth daily. Patient not taking: Reported on 12/13/2022 08/23/22   Loyola Mast, MD  clotrimazole-betamethasone (  LOTRISONE) cream APPLY TO THE AFFECTED AND SURROUNDING AREAS OF THE SKIN TWICE A DAY IN THE MORNING AND EVENING FOR 2 WEEKS    [provider]  hydrOXYzine (VISTARIL) 50 MG capsule TAKE 1 CAPSULE BY MOUTH AS NEEDED FOR ANXIETY    [provider]  metFORMIN (GLUMETZA) 1000 MG (MOD) 24 hr tablet Take 1 tablet (1,000 mg total) by mouth daily with breakfast. Patient not taking: Reported on 12/13/2022 09/23/21   Vena Austria, MD  mometasone (ELOCON) 0.1 % cream Apply a small amount to affected area daily. 12/13/22   Loyola Mast, MD    Family History Family History  Problem Relation Age of Onset   Heart disease Mother    Stroke Mother    Diabetes Mother    Hypertension Mother    Thyroid disease Mother    Diabetes Father    Hypertension  Maternal Grandmother    Gout Maternal Grandmother    Cancer Paternal Grandmother        Pancreatic   Pancreatic cancer Paternal Grandmother     Social History Social History   Tobacco Use   Smoking status: Never   Smokeless tobacco: Never  Vaping Use   Vaping Use: Former  Substance Use Topics   Alcohol use: Yes    Comment: ocassionally   Drug use: Yes    Types: Marijuana    Comment: 1/2 gram daily     Allergies   Patient has no known allergies.   Review of Systems Review of Systems Per HPI  Physical Exam Triage Vital Signs ED Triage Vitals  Enc Vitals Group     BP 12/19/22 1657 128/83     Pulse Rate 12/19/22 1657 92     Resp 12/19/22 1657 18     Temp 12/19/22 1657 98.9 F (37.2 C)     Temp Source 12/19/22 1657 Oral     SpO2 12/19/22 1657 98 %     Weight 12/19/22 1657 231 lb (104.8 kg)     Height 12/19/22 1657 5\' 4"  (1.626 m)     Head Circumference --      Peak Flow --      Pain Score 12/19/22 1655 0     Pain Loc --      Pain Edu? --      Excl. in GC? --    No data found.  Updated Vital Signs BP 128/83 (BP Location: Left Arm)   Pulse 92   Temp 98.9 F (37.2 C) (Oral)   Resp 18   Ht 5\' 4"  (1.626 m)   Wt 231 lb (104.8 kg)   LMP 07/27/2022 (Exact Date)   SpO2 98%   BMI 39.65 kg/m   Visual Acuity Right Eye Distance:   Left Eye Distance:   Bilateral Distance:    Right Eye Near:   Left Eye Near:    Bilateral Near:     Physical Exam Vitals and nursing note reviewed. Exam conducted with a chaperone present (Arianna, CMA).  Constitutional:      Appearance: She is not ill-appearing or toxic-appearing.  HENT:     Head: Normocephalic and atraumatic.     Right Ear: Hearing and external ear normal.     Left Ear: Hearing and external ear normal.     Nose: Nose normal.     Mouth/Throat:     Lips: Pink.  Eyes:     General: Lids are normal. Vision grossly intact. Gaze aligned appropriately.     Extraocular Movements: Extraocular  movements intact.      Conjunctiva/sclera: Conjunctivae normal.  Cardiovascular:     Rate and Rhythm: Normal rate and regular rhythm.     Heart sounds: Normal heart sounds, S1 normal and S2 normal.  Pulmonary:     Effort: Pulmonary effort is normal. No respiratory distress.     Breath sounds: Normal breath sounds and air entry.  Abdominal:     Comments: Gravid  Genitourinary:    Exam position: Knee-chest position.     Pubic Area: No rash or pubic lice.      Labia:        Right: No rash, tenderness, lesion or injury.        Left: No rash, tenderness, lesion or injury.      Vagina: Vaginal discharge present.     Comments: Thick white vaginal discharge without odor present on inspection. No rash or signs of skin excoriation.  Musculoskeletal:     Cervical back: Neck supple.  Skin:    General: Skin is warm and dry.     Capillary Refill: Capillary refill takes less than 2 seconds.     Findings: Rash present.          Comments: Lesion to the upper right lateral thigh present. See images below for further detail.  Neurological:     General: No focal deficit present.     Mental Status: She is alert and oriented to person, place, and time. Mental status is at baseline.     Cranial Nerves: No dysarthria or facial asymmetry.  Psychiatric:        Mood and Affect: Mood normal.        Speech: Speech normal.        Behavior: Behavior normal.        Thought Content: Thought content normal.        Judgment: Judgment normal.     Lesion to the right upper lateral thigh today   Photo of lesion to the right upper lateral thigh/buttock from patient's phone from a few days ago    UC Treatments / Results  Labs (all labs ordered are listed, but only abnormal results are displayed) Labs Reviewed  CERVICOVAGINAL ANCILLARY ONLY    EKG   Radiology No results found.  Procedures Procedures (including critical care time)  Medications Ordered in UC Medications - No data to display  Initial Impression /  Assessment and Plan / UC Course  I have reviewed the triage vital signs and the nursing notes.  Pertinent labs & imaging results that were available during my care of the patient were reviewed by me and considered in my medical decision making (see chart for details).   1. Rash and nonspecific skin eruption Dermatology has called lesion HSV-2 in the past. As shown in image from patient's phone of rash a few days ago, rash started as cluster of vesicles on a raised and erythematous surface consistent with viral etiology. No open lesions available for me to swab today to confirm HSV-2. However, since this has been diagnosed in the past and she is symptomatic, will trial acyclovir to improve lesion. No vulvovaginal lesions. Advised acyclovir as prescribed and follow-up with dermatology for ongoing evaluation and management of outbreaks. Aquaphor to the lesion recommended. No evidence of secondary bacterial infection.   2. Yeast vaginitis Exam shows findings consistent with yeast vaginitis. Terconazole cream 1 applicator full once nightly as prescribed for 7 days. Cytology pending to evaluate for other venereal diseases will return in 2-3 days.  Will call patient if anything else is positive and treat accordingly when swab results.   OB/GYN follow-up advised. No indication for referral to MAU today as she is nontoxic in appearance, afebrile, and with hemodynamically stable vital signs. No vaginal bleeding, decreased fetal movement, or urinary symptoms.   Discussed physical exam and available lab work findings in clinic with patient.  Counseled patient regarding appropriate use of medications and potential side effects for all medications recommended or prescribed today. Discussed red flag signs and symptoms of worsening condition,when to call the PCP office, return to urgent care, and when to seek higher level of care in the emergency department. Patient verbalizes understanding and agreement with plan. All  questions answered. Patient discharged in stable condition.    Final Clinical Impressions(s) / UC Diagnoses   Final diagnoses:  Rash and nonspecific skin eruption  Yeast vaginitis  HSV-2 infection complicating pregnancy, second trimester     Discharge Instructions      Take acyclovir tablets by mouth every 8 hours for the next 7 days (with breakfast, lunch, and dinner) to treat your HSV-2 infection.  Use terconazole intravaginal cream once daily at bedtime.  Apply this after laying down and do not get up after applying this to the intravaginal area.    Please follow-up with your OB/GYN as needed for ongoing evaluation and for scheduled appointments for further monitoring of baby.  Your vaginal swab will come back in the next 2 to 3 days and we will call you if anything is abnormal.  If you develop any new or worsening symptoms or do not improve in the next 2 to 3 days, please return.  If your symptoms are severe, please go to the emergency room.  Follow-up with your primary care provider for further evaluation and management of your symptoms as well as ongoing wellness visits.  I hope you feel better!   ED Prescriptions     Medication Sig Dispense Auth. Provider   acyclovir (ZOVIRAX) 400 MG tablet Take 1 tablet (400 mg total) by mouth 3 (three) times daily for 7 days. 21 tablet Reita May M, FNP   terconazole (TERAZOL 7) 0.4 % vaginal cream Place 1 applicator vaginally at bedtime. 45 g Carlisle Beers, FNP      PDMP not reviewed this encounter.   Carlisle Beers, [place] 12/21/22 2153

## 2022-12-19 NOTE — ED Triage Notes (Addendum)
Patient having vaginal itching. Patient is also pregnant.   Onset 1 week. Monistat made the symptoms worse.   Patient has a rash on the right thigh that she believes to be HSV. Wanted this tested and treated. Patient has history of HSV 2 in this same area when she was a kid.

## 2022-12-21 LAB — CERVICOVAGINAL ANCILLARY ONLY
Bacterial Vaginitis (gardnerella): NEGATIVE
Candida Glabrata: NEGATIVE
Candida Vaginitis: POSITIVE — AB
Chlamydia: NEGATIVE
Comment: NEGATIVE
Comment: NEGATIVE
Comment: NEGATIVE
Comment: NEGATIVE
Comment: NEGATIVE
Comment: NORMAL
Neisseria Gonorrhea: NEGATIVE
Trichomonas: NEGATIVE

## 2022-12-23 DIAGNOSIS — F4323 Adjustment disorder with mixed anxiety and depressed mood: Secondary | ICD-10-CM | POA: Diagnosis not present

## 2022-12-23 DIAGNOSIS — M654 Radial styloid tenosynovitis [de Quervain]: Secondary | ICD-10-CM | POA: Diagnosis not present

## 2022-12-30 ENCOUNTER — Ambulatory Visit: Payer: 59

## 2023-01-06 DIAGNOSIS — F4323 Adjustment disorder with mixed anxiety and depressed mood: Secondary | ICD-10-CM | POA: Diagnosis not present

## 2023-01-13 DIAGNOSIS — Z3A23 23 weeks gestation of pregnancy: Secondary | ICD-10-CM | POA: Diagnosis not present

## 2023-01-13 DIAGNOSIS — Z362 Encounter for other antenatal screening follow-up: Secondary | ICD-10-CM | POA: Diagnosis not present

## 2023-01-13 DIAGNOSIS — Z3402 Encounter for supervision of normal first pregnancy, second trimester: Secondary | ICD-10-CM | POA: Diagnosis not present

## 2023-01-20 DIAGNOSIS — F4323 Adjustment disorder with mixed anxiety and depressed mood: Secondary | ICD-10-CM | POA: Diagnosis not present

## 2023-02-10 DIAGNOSIS — F4323 Adjustment disorder with mixed anxiety and depressed mood: Secondary | ICD-10-CM | POA: Diagnosis not present

## 2023-02-10 DIAGNOSIS — Z23 Encounter for immunization: Secondary | ICD-10-CM | POA: Diagnosis not present

## 2023-02-10 DIAGNOSIS — Z3480 Encounter for supervision of other normal pregnancy, unspecified trimester: Secondary | ICD-10-CM | POA: Diagnosis not present

## 2023-02-10 DIAGNOSIS — Z3A27 27 weeks gestation of pregnancy: Secondary | ICD-10-CM | POA: Diagnosis not present

## 2023-02-10 DIAGNOSIS — O169 Unspecified maternal hypertension, unspecified trimester: Secondary | ICD-10-CM | POA: Diagnosis not present

## 2023-02-24 DIAGNOSIS — F4323 Adjustment disorder with mixed anxiety and depressed mood: Secondary | ICD-10-CM | POA: Diagnosis not present

## 2023-02-24 DIAGNOSIS — Z3403 Encounter for supervision of normal first pregnancy, third trimester: Secondary | ICD-10-CM | POA: Diagnosis not present

## 2023-02-24 DIAGNOSIS — O10013 Pre-existing essential hypertension complicating pregnancy, third trimester: Secondary | ICD-10-CM | POA: Diagnosis not present

## 2023-02-24 DIAGNOSIS — Z3A29 29 weeks gestation of pregnancy: Secondary | ICD-10-CM | POA: Diagnosis not present

## 2023-02-26 LAB — HM DIABETES EYE EXAM

## 2023-03-01 DIAGNOSIS — Z3483 Encounter for supervision of other normal pregnancy, third trimester: Secondary | ICD-10-CM | POA: Diagnosis not present

## 2023-03-01 DIAGNOSIS — Z3482 Encounter for supervision of other normal pregnancy, second trimester: Secondary | ICD-10-CM | POA: Diagnosis not present

## 2023-03-15 DIAGNOSIS — Z3A32 32 weeks gestation of pregnancy: Secondary | ICD-10-CM | POA: Diagnosis not present

## 2023-03-15 DIAGNOSIS — O10013 Pre-existing essential hypertension complicating pregnancy, third trimester: Secondary | ICD-10-CM | POA: Diagnosis not present

## 2023-03-18 DIAGNOSIS — Z3A32 32 weeks gestation of pregnancy: Secondary | ICD-10-CM | POA: Diagnosis not present

## 2023-03-18 DIAGNOSIS — Z3403 Encounter for supervision of normal first pregnancy, third trimester: Secondary | ICD-10-CM | POA: Diagnosis not present

## 2023-03-18 DIAGNOSIS — O24419 Gestational diabetes mellitus in pregnancy, unspecified control: Secondary | ICD-10-CM | POA: Diagnosis not present

## 2023-03-18 DIAGNOSIS — O10013 Pre-existing essential hypertension complicating pregnancy, third trimester: Secondary | ICD-10-CM | POA: Diagnosis not present

## 2023-03-22 DIAGNOSIS — O24415 Gestational diabetes mellitus in pregnancy, controlled by oral hypoglycemic drugs: Secondary | ICD-10-CM | POA: Diagnosis not present

## 2023-03-22 DIAGNOSIS — Z3A33 33 weeks gestation of pregnancy: Secondary | ICD-10-CM | POA: Diagnosis not present

## 2023-03-24 DIAGNOSIS — E139 Other specified diabetes mellitus without complications: Secondary | ICD-10-CM | POA: Diagnosis not present

## 2023-03-24 DIAGNOSIS — Z8249 Family history of ischemic heart disease and other diseases of the circulatory system: Secondary | ICD-10-CM | POA: Diagnosis not present

## 2023-03-24 DIAGNOSIS — Z833 Family history of diabetes mellitus: Secondary | ICD-10-CM | POA: Diagnosis not present

## 2023-03-24 DIAGNOSIS — Z7982 Long term (current) use of aspirin: Secondary | ICD-10-CM | POA: Diagnosis not present

## 2023-03-24 DIAGNOSIS — K219 Gastro-esophageal reflux disease without esophagitis: Secondary | ICD-10-CM | POA: Diagnosis not present

## 2023-03-24 DIAGNOSIS — Z823 Family history of stroke: Secondary | ICD-10-CM | POA: Diagnosis not present

## 2023-03-24 DIAGNOSIS — F324 Major depressive disorder, single episode, in partial remission: Secondary | ICD-10-CM | POA: Diagnosis not present

## 2023-03-24 DIAGNOSIS — Z87891 Personal history of nicotine dependence: Secondary | ICD-10-CM | POA: Diagnosis not present

## 2023-03-24 DIAGNOSIS — I10 Essential (primary) hypertension: Secondary | ICD-10-CM | POA: Diagnosis not present

## 2023-03-24 DIAGNOSIS — O133 Gestational [pregnancy-induced] hypertension without significant proteinuria, third trimester: Secondary | ICD-10-CM | POA: Diagnosis not present

## 2023-03-24 DIAGNOSIS — O24415 Gestational diabetes mellitus in pregnancy, controlled by oral hypoglycemic drugs: Secondary | ICD-10-CM | POA: Diagnosis not present

## 2023-03-24 DIAGNOSIS — Z7984 Long term (current) use of oral hypoglycemic drugs: Secondary | ICD-10-CM | POA: Diagnosis not present

## 2023-03-25 ENCOUNTER — Inpatient Hospital Stay (HOSPITAL_COMMUNITY)
Admission: AD | Admit: 2023-03-25 | Discharge: 2023-04-13 | DRG: 787 | Disposition: A | Discharge status: Home / Self Care | Payer: 59 | Attending: Obstetrics and Gynecology | Admitting: Obstetrics and Gynecology

## 2023-03-25 ENCOUNTER — Other Ambulatory Visit: Payer: Self-pay

## 2023-03-25 ENCOUNTER — Encounter (HOSPITAL_COMMUNITY): Payer: Self-pay | Admitting: *Deleted

## 2023-03-25 DIAGNOSIS — O114 Pre-existing hypertension with pre-eclampsia, complicating childbirth: Secondary | ICD-10-CM | POA: Diagnosis not present

## 2023-03-25 DIAGNOSIS — Z98891 History of uterine scar from previous surgery: Principal | ICD-10-CM

## 2023-03-25 DIAGNOSIS — O99214 Obesity complicating childbirth: Secondary | ICD-10-CM | POA: Diagnosis present

## 2023-03-25 DIAGNOSIS — Z3A34 34 weeks gestation of pregnancy: Secondary | ICD-10-CM

## 2023-03-25 DIAGNOSIS — O24113 Pre-existing diabetes mellitus, type 2, in pregnancy, third trimester: Secondary | ICD-10-CM | POA: Diagnosis not present

## 2023-03-25 DIAGNOSIS — O9902 Anemia complicating childbirth: Secondary | ICD-10-CM | POA: Diagnosis present

## 2023-03-25 DIAGNOSIS — O24425 Gestational diabetes mellitus in childbirth, controlled by oral hypoglycemic drugs: Secondary | ICD-10-CM | POA: Diagnosis present

## 2023-03-25 DIAGNOSIS — O26893 Other specified pregnancy related conditions, third trimester: Secondary | ICD-10-CM | POA: Diagnosis present

## 2023-03-25 DIAGNOSIS — R0602 Shortness of breath: Secondary | ICD-10-CM | POA: Diagnosis present

## 2023-03-25 DIAGNOSIS — R030 Elevated blood-pressure reading, without diagnosis of hypertension: Secondary | ICD-10-CM | POA: Diagnosis not present

## 2023-03-25 DIAGNOSIS — O10913 Unspecified pre-existing hypertension complicating pregnancy, third trimester: Secondary | ICD-10-CM

## 2023-03-25 DIAGNOSIS — A6000 Herpesviral infection of urogenital system, unspecified: Secondary | ICD-10-CM | POA: Diagnosis present

## 2023-03-25 DIAGNOSIS — M62838 Other muscle spasm: Secondary | ICD-10-CM | POA: Diagnosis not present

## 2023-03-25 DIAGNOSIS — O1092 Unspecified pre-existing hypertension complicating childbirth: Secondary | ICD-10-CM | POA: Diagnosis present

## 2023-03-25 DIAGNOSIS — O10919 Unspecified pre-existing hypertension complicating pregnancy, unspecified trimester: Secondary | ICD-10-CM

## 2023-03-25 DIAGNOSIS — Z3A33 33 weeks gestation of pregnancy: Secondary | ICD-10-CM

## 2023-03-25 DIAGNOSIS — O9832 Other infections with a predominantly sexual mode of transmission complicating childbirth: Secondary | ICD-10-CM | POA: Diagnosis present

## 2023-03-25 DIAGNOSIS — O10013 Pre-existing essential hypertension complicating pregnancy, third trimester: Secondary | ICD-10-CM | POA: Diagnosis not present

## 2023-03-25 DIAGNOSIS — O99824 Streptococcus B carrier state complicating childbirth: Secondary | ICD-10-CM | POA: Diagnosis present

## 2023-03-25 DIAGNOSIS — O368330 Maternal care for abnormalities of the fetal heart rate or rhythm, third trimester, not applicable or unspecified: Secondary | ICD-10-CM | POA: Diagnosis not present

## 2023-03-25 DIAGNOSIS — I10 Essential (primary) hypertension: Secondary | ICD-10-CM | POA: Diagnosis present

## 2023-03-25 DIAGNOSIS — O24913 Unspecified diabetes mellitus in pregnancy, third trimester: Secondary | ICD-10-CM

## 2023-03-25 HISTORY — DX: Hidradenitis suppurativa: L73.2

## 2023-03-25 HISTORY — DX: Gestational diabetes mellitus in pregnancy, unspecified control: O24.419

## 2023-03-25 LAB — CBC
HCT: 33.3 % — ABNORMAL LOW (ref 36.0–46.0)
Hemoglobin: 11 g/dL — ABNORMAL LOW (ref 12.0–15.0)
MCH: 28.9 pg (ref 26.0–34.0)
MCHC: 33 g/dL (ref 30.0–36.0)
MCV: 87.4 fL (ref 80.0–100.0)
Platelets: 231 10*3/uL (ref 150–400)
RBC: 3.81 MIL/uL — ABNORMAL LOW (ref 3.87–5.11)
RDW: 13.5 % (ref 11.5–15.5)
WBC: 10.4 10*3/uL (ref 4.0–10.5)
nRBC: 0 % (ref 0.0–0.2)

## 2023-03-25 LAB — COMPREHENSIVE METABOLIC PANEL
ALT: 18 U/L (ref 0–44)
AST: 20 U/L (ref 15–41)
Albumin: 2.8 g/dL — ABNORMAL LOW (ref 3.5–5.0)
Alkaline Phosphatase: 80 U/L (ref 38–126)
Anion gap: 9 (ref 5–15)
BUN: 6 mg/dL (ref 6–20)
CO2: 22 mmol/L (ref 22–32)
Calcium: 9.5 mg/dL (ref 8.9–10.3)
Chloride: 105 mmol/L (ref 98–111)
Creatinine, Ser: 0.61 mg/dL (ref 0.44–1.00)
GFR, Estimated: >60 mL/min (ref >60)
Glucose, Bld: 83 mg/dL (ref 70–99)
Potassium: 4.1 mmol/L (ref 3.5–5.1)
Sodium: 136 mmol/L (ref 135–145)
Total Bilirubin: <0.1 mg/dL — ABNORMAL LOW (ref 0.3–1.2)
Total Protein: 5.9 g/dL — ABNORMAL LOW (ref 6.5–8.1)

## 2023-03-25 LAB — TYPE AND SCREEN
ABO/RH(D): A POS
Antibody Screen: NEGATIVE

## 2023-03-25 LAB — GLUCOSE, CAPILLARY: Glucose-Capillary: 215 mg/dL — ABNORMAL HIGH (ref 70–99)

## 2023-03-25 LAB — PROTEIN / CREATININE RATIO, URINE
Creatinine, Urine: 71 mg/dL
Total Protein, Urine: <6 mg/dL

## 2023-03-25 MED ORDER — CALCIUM CARBONATE ANTACID 500 MG PO CHEW
2.0000 | CHEWABLE_TABLET | ORAL | Status: DC | PRN
  Administered 2023-03-28 – 2023-04-02 (×6): 400 mg via ORAL
  Filled 2023-03-25 (×6): qty 2

## 2023-03-25 MED ORDER — LACTATED RINGERS IV SOLN: 125.0000 mL/h | INTRAVENOUS | Status: AC

## 2023-03-25 MED ORDER — SODIUM CHLORIDE 0.9% FLUSH
3.0000 mL | Freq: Two times a day (BID) | INTRAVENOUS | Status: DC
  Administered 2023-03-25 – 2023-04-08 (×28): 3 mL via INTRAVENOUS

## 2023-03-25 MED ORDER — ACETAMINOPHEN 325 MG PO TABS
650.0000 mg | ORAL_TABLET | ORAL | Status: DC | PRN
  Administered 2023-03-28 – 2023-04-07 (×9): 650 mg via ORAL
  Filled 2023-03-25 (×9): qty 2

## 2023-03-25 MED ORDER — ASPIRIN 81 MG PO TBEC
81.0000 mg | DELAYED_RELEASE_TABLET | Freq: Every day | ORAL | Status: DC
  Administered 2023-03-26 – 2023-04-08 (×14): 81 mg via ORAL
  Filled 2023-03-25 (×15): qty 1

## 2023-03-25 MED ORDER — METFORMIN HCL ER 500 MG PO TB24
500.0000 mg | ORAL_TABLET | Freq: Two times a day (BID) | ORAL | Status: DC
  Administered 2023-03-25 – 2023-04-05 (×22): 500 mg via ORAL
  Filled 2023-03-25 (×26): qty 1

## 2023-03-25 MED ORDER — LABETALOL HCL 200 MG PO TABS
400.0000 mg | ORAL_TABLET | Freq: Three times a day (TID) | ORAL | Status: DC
  Administered 2023-03-25 – 2023-03-26 (×2): 400 mg via ORAL
  Filled 2023-03-25 (×2): qty 2

## 2023-03-25 MED ORDER — SODIUM CHLORIDE 0.9% FLUSH: 3.0000 mL | INTRAVENOUS | Status: DC | PRN

## 2023-03-25 MED ORDER — SODIUM CHLORIDE 0.9 % IV SOLN: 250.0000 mL | INTRAVENOUS | Status: DC | PRN

## 2023-03-25 MED ORDER — INSULIN ASPART 100 UNIT/ML IJ SOLN: 0.0000 [IU] | Freq: Three times a day (TID) | INTRAMUSCULAR | Status: DC

## 2023-03-25 MED ORDER — BETAMETHASONE SOD PHOS & ACET 6 (3-3) MG/ML IJ SUSP
12.0000 mg | INTRAMUSCULAR | Status: AC
  Administered 2023-03-25 – 2023-03-26 (×2): 12 mg via INTRAMUSCULAR

## 2023-03-25 MED ORDER — VALACYCLOVIR HCL 500 MG PO TABS
500.0000 mg | ORAL_TABLET | Freq: Two times a day (BID) | ORAL | Status: DC
  Administered 2023-03-25 – 2023-04-08 (×29): 500 mg via ORAL
  Filled 2023-03-25 (×29): qty 1

## 2023-03-25 MED ORDER — ZOLPIDEM TARTRATE 5 MG PO TABS
5.0000 mg | ORAL_TABLET | Freq: Every evening | ORAL | Status: DC | PRN
  Administered 2023-03-28 – 2023-04-07 (×6): 5 mg via ORAL
  Filled 2023-03-25 (×6): qty 1

## 2023-03-25 MED ORDER — PRENATAL MULTIVITAMIN CH
1.0000 | ORAL_TABLET | Freq: Every day | ORAL | Status: DC
  Administered 2023-03-26 – 2023-04-08 (×14): 1 via ORAL
  Filled 2023-03-25 (×14): qty 1

## 2023-03-25 MED ORDER — DOCUSATE SODIUM 100 MG PO CAPS
100.0000 mg | ORAL_CAPSULE | Freq: Every day | ORAL | Status: DC
  Administered 2023-03-25 – 2023-04-08 (×15): 100 mg via ORAL
  Filled 2023-03-25 (×15): qty 1

## 2023-03-25 NOTE — H&P (Signed)
Monique Jackson is a 27 y.o. female G1 at [redacted]w[redacted]d presenting for elevated BPs.  Patient was seen in the office today for routine visit and c/o new N/V, edema.  Denies HA, vision change, RUQ pain and CP/SOB.  She has CHTN and takes labetalol 200 BID.  BPs typically run in normal range but today was 158/102; negative protein.  5 lbs weight gain in last 3 days.  In MAU, patient had 3 severe range BPs; not repetitive and not requiring IV meds.  Labs are wnl and P:C below reportable range.  Patient has A2DM and takes metformin 500 BID.  BPP 8/8 in office today; last growth 6/20 with EFW 3#0 (24%).  Patient has h/o gonorrhea this pregnancy with negative TOC on 4/4.  She has h/o HSV and currently denies prodromal sxs or lesions.  H/O depression on no medication.  H/o hidradenitis suppurativa.    OB History     Gravida  1   Para  0   Term  0   Preterm  0   AB  0   Living  0      SAB  0   IAB  0   Ectopic  0   Multiple  0   Live Births  0          Past Medical History:  Diagnosis Date   Allergy    Anxiety    Gestational diabetes    Hidradenitis    HSV-2 (herpes simplex virus 2) infection    Morbid obesity (HCC) 10/17/2017   Recurrent boils    History reviewed. No pertinent surgical history. Family History: family history includes Cancer in her paternal grandmother; Diabetes in her father and mother; Gout in her maternal grandmother; Heart disease in her mother; Hypertension in her maternal grandmother and mother; Pancreatic cancer in her paternal grandmother; Stroke in her mother; Thyroid disease in her mother. Social History:  reports that she has never smoked. She has never used smokeless tobacco. She reports that she does not currently use alcohol. She reports that she does not currently use drugs after having used the following drugs: Marijuana.     Maternal Diabetes: Yes:  Diabetes Type:  Insulin/Medication controlled Genetic Screening: Normal Maternal  Ultrasounds/Referrals: Normal Fetal Ultrasounds or other Referrals:  None Maternal Substance Abuse:  No Significant Maternal Medications:  Meds include: Other: metformin, labetalol Significant Maternal Lab Results:  None Number of Prenatal Visits:greater than 3 verified prenatal visits Other Comments:  None  Review of Systems Maternal Medical History:  Fetal activity: Perceived fetal activity is normal.   Last perceived fetal movement was within the past hour.   Prenatal complications: PIH.   Prenatal Complications - Diabetes: gestational. Diabetes is managed by oral agent (monotherapy).       Blood pressure (!) 151/85, pulse 88, temperature 98.2 F (36.8 C), resp. rate 18, height 5' 3.5" (1.613 m), weight 112.9 kg, last menstrual period 07/27/2022, SpO2 98%. Maternal Exam:  Uterine Assessment: Contraction strength is mild.  Contraction frequency is rare.  Abdomen: Patient reports no abdominal tenderness. Fundal height is c/w dates.   Fetal presentation: vertex   Fetal Exam Fetal Monitor Review: Baseline rate: 150.  Variability: moderate (6-25 bpm).   Pattern: accelerations present and no decelerations.   Fetal State Assessment: Category I - tracings are normal.   Physical Exam Constitutional:      Appearance: Normal appearance.  HENT:     Head: Normocephalic and atraumatic.  Pulmonary:     Effort: Pulmonary  effort is normal.  Abdominal:     Palpations: Abdomen is soft.  Musculoskeletal:        General: Normal range of motion.     Cervical back: Normal range of motion.  Skin:    General: Skin is warm and dry.  Neurological:     Mental Status: She is alert and oriented to person, place, and time.  Psychiatric:        Mood and Affect: Mood normal.        Behavior: Behavior normal.     Prenatal labs: ABO, Rh:  A pos Antibody:  Negative Rubella:  Immune RPR: NON-REACTIVE (12/18 1512)  HBsAg:   Negative HIV: NON-REACTIVE (12/18 1512)  GBS:    unknown  Assessment/Plan: 26yo G1 at [redacted]w[redacted]d with likely exacerbation of CHTN -Increase labetalol to 400 TID -Repeat labs in AM -Collect 24 hour urine -BMZ -MFM consult for growth u/s and recs -A2DM-continue metformin.  DM coordinator consult for anticipated elevation in CBGs with BMZ administration.   -H/O HSV-start Valtrex now given likely delivery by 37 weeks -GBS -NST q shift -SCDs   Mitchel Honour 03/25/2023, 3:42 PM
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
## 2023-03-25 NOTE — Inpatient Diabetes Management (Signed)
Inpatient Diabetes Program Recommendations  Diabetes Treatment Program Recommendations  ADA Standards of Care Diabetes in Pregnancy Target Glucose Ranges:  Fasting: 70 - 95 mg/dL 1 hr postprandial: Less than 140mg /dL (from first bite of meal) 2 hr postprandial: Less than 120 mg/dL (from first bite of meal)     Latest Reference Range & Units 03/25/23 11:11  Glucose 70 - 99 mg/dL 83   Review of Glycemic Control  Diabetes history: DM2 Outpatient Diabetes medications: Fortamet 500 mg BID;  Metformin 1000 mg daily prior to pregnancy Current orders for Inpatient glycemic control: CBGs  Inpatient Diabetes Program Recommendations:    Insulin: Please consider using Diabetes Treatment for Pregnant/Postpartum Patients order set to order Novolog 0-14 units QID (fasting and 2 hour post prandial).  NOTE: Noted consult for patient with hx of DM. Per chart, patient [redacted]W[redacted]D gestation and takes Fortamet 500 mg BID as an outpatient for DM control. Lab glucose 83 mg/dl today at 82:95. Recommend ordering Novolog 0-14 units QID (fasting and 2 hour post prandial). Noted patient is ordered betamethasone which is likely to contribute to hyperglycemia. Will plan to follow along and make further recommendations if needed.  Thanks, Orlando Penner, RN, MSN, CDCES Diabetes Coordinator Inpatient Diabetes Program [phone] (Team Pager from 8am to 5pm)

## 2023-03-25 NOTE — MAU Note (Signed)
.  Monique Jackson is a 27 y.o. at 105w5d here in MAU reporting: sent from office for  elevated b/p. Diastolic 28f 106.  Denies any headache or visual changes at this time. Has had headaches  off and on that have gone away on their own for a few weeks. Reports some nausea. Good fetal movement felt. Denies any abd pain or cramping   Onset of complaint: today Pain score: 0 Vitals:   03/25/23 1117  BP: (!) 138/98  Pulse: 78  Resp: 18  Temp: 98.2 F (36.8 C)     FHT:147 Lab orders placed from triage:  [facility]

## 2023-03-26 ENCOUNTER — Observation Stay (HOSPITAL_BASED_OUTPATIENT_CLINIC_OR_DEPARTMENT_OTHER): Payer: 59

## 2023-03-26 DIAGNOSIS — O1092 Unspecified pre-existing hypertension complicating childbirth: Secondary | ICD-10-CM | POA: Diagnosis not present

## 2023-03-26 DIAGNOSIS — O9832 Other infections with a predominantly sexual mode of transmission complicating childbirth: Secondary | ICD-10-CM | POA: Diagnosis not present

## 2023-03-26 DIAGNOSIS — O113 Pre-existing hypertension with pre-eclampsia, third trimester: Secondary | ICD-10-CM | POA: Diagnosis not present

## 2023-03-26 DIAGNOSIS — M62838 Other muscle spasm: Secondary | ICD-10-CM | POA: Diagnosis not present

## 2023-03-26 DIAGNOSIS — A6000 Herpesviral infection of urogenital system, unspecified: Secondary | ICD-10-CM | POA: Diagnosis not present

## 2023-03-26 DIAGNOSIS — Z3A34 34 weeks gestation of pregnancy: Secondary | ICD-10-CM | POA: Diagnosis not present

## 2023-03-26 DIAGNOSIS — R030 Elevated blood-pressure reading, without diagnosis of hypertension: Secondary | ICD-10-CM | POA: Diagnosis not present

## 2023-03-26 DIAGNOSIS — O10013 Pre-existing essential hypertension complicating pregnancy, third trimester: Secondary | ICD-10-CM

## 2023-03-26 DIAGNOSIS — F4323 Adjustment disorder with mixed anxiety and depressed mood: Secondary | ICD-10-CM | POA: Diagnosis not present

## 2023-03-26 DIAGNOSIS — O10919 Unspecified pre-existing hypertension complicating pregnancy, unspecified trimester: Principal | ICD-10-CM

## 2023-03-26 DIAGNOSIS — O1403 Mild to moderate pre-eclampsia, third trimester: Secondary | ICD-10-CM | POA: Diagnosis not present

## 2023-03-26 DIAGNOSIS — O24414 Gestational diabetes mellitus in pregnancy, insulin controlled: Secondary | ICD-10-CM

## 2023-03-26 DIAGNOSIS — Z6841 Body Mass Index (BMI) 40.0 and over, adult: Secondary | ICD-10-CM | POA: Diagnosis not present

## 2023-03-26 DIAGNOSIS — E669 Obesity, unspecified: Secondary | ICD-10-CM | POA: Diagnosis not present

## 2023-03-26 DIAGNOSIS — Z3A33 33 weeks gestation of pregnancy: Secondary | ICD-10-CM | POA: Diagnosis not present

## 2023-03-26 DIAGNOSIS — O24415 Gestational diabetes mellitus in pregnancy, controlled by oral hypoglycemic drugs: Secondary | ICD-10-CM | POA: Diagnosis not present

## 2023-03-26 DIAGNOSIS — O24913 Unspecified diabetes mellitus in pregnancy, third trimester: Secondary | ICD-10-CM

## 2023-03-26 DIAGNOSIS — O9902 Anemia complicating childbirth: Secondary | ICD-10-CM | POA: Diagnosis not present

## 2023-03-26 DIAGNOSIS — O24425 Gestational diabetes mellitus in childbirth, controlled by oral hypoglycemic drugs: Secondary | ICD-10-CM | POA: Diagnosis not present

## 2023-03-26 DIAGNOSIS — O368330 Maternal care for abnormalities of the fetal heart rate or rhythm, third trimester, not applicable or unspecified: Secondary | ICD-10-CM | POA: Diagnosis not present

## 2023-03-26 DIAGNOSIS — O288 Other abnormal findings on antenatal screening of mother: Secondary | ICD-10-CM | POA: Diagnosis not present

## 2023-03-26 DIAGNOSIS — O24113 Pre-existing diabetes mellitus, type 2, in pregnancy, third trimester: Secondary | ICD-10-CM | POA: Diagnosis not present

## 2023-03-26 DIAGNOSIS — O99214 Obesity complicating childbirth: Secondary | ICD-10-CM | POA: Diagnosis not present

## 2023-03-26 DIAGNOSIS — O26893 Other specified pregnancy related conditions, third trimester: Secondary | ICD-10-CM | POA: Diagnosis not present

## 2023-03-26 DIAGNOSIS — O99213 Obesity complicating pregnancy, third trimester: Secondary | ICD-10-CM | POA: Diagnosis not present

## 2023-03-26 DIAGNOSIS — O99824 Streptococcus B carrier state complicating childbirth: Secondary | ICD-10-CM | POA: Diagnosis not present

## 2023-03-26 DIAGNOSIS — O114 Pre-existing hypertension with pre-eclampsia, complicating childbirth: Secondary | ICD-10-CM | POA: Diagnosis not present

## 2023-03-26 DIAGNOSIS — Z3A35 35 weeks gestation of pregnancy: Secondary | ICD-10-CM | POA: Diagnosis not present

## 2023-03-26 DIAGNOSIS — R0602 Shortness of breath: Secondary | ICD-10-CM | POA: Diagnosis not present

## 2023-03-26 LAB — COMPREHENSIVE METABOLIC PANEL
ALT: 22 U/L (ref 0–44)
AST: 23 U/L (ref 15–41)
Albumin: 2.7 g/dL — ABNORMAL LOW (ref 3.5–5.0)
Alkaline Phosphatase: 79 U/L (ref 38–126)
Anion gap: 11 (ref 5–15)
BUN: 9 mg/dL (ref 6–20)
CO2: 20 mmol/L — ABNORMAL LOW (ref 22–32)
Calcium: 9.1 mg/dL (ref 8.9–10.3)
Chloride: 103 mmol/L (ref 98–111)
Creatinine, Ser: 0.71 mg/dL (ref 0.44–1.00)
GFR, Estimated: >60 mL/min (ref >60)
Glucose, Bld: 155 mg/dL — ABNORMAL HIGH (ref 70–99)
Potassium: 4.3 mmol/L (ref 3.5–5.1)
Sodium: 134 mmol/L — ABNORMAL LOW (ref 135–145)
Total Bilirubin: 0.3 mg/dL (ref 0.3–1.2)
Total Protein: 6.1 g/dL — ABNORMAL LOW (ref 6.5–8.1)

## 2023-03-26 LAB — CBC
HCT: 32.8 % — ABNORMAL LOW (ref 36.0–46.0)
Hemoglobin: 10.7 g/dL — ABNORMAL LOW (ref 12.0–15.0)
MCH: 27.9 pg (ref 26.0–34.0)
MCHC: 32.6 g/dL (ref 30.0–36.0)
MCV: 85.4 fL (ref 80.0–100.0)
Platelets: 224 10*3/uL (ref 150–400)
RBC: 3.84 MIL/uL — ABNORMAL LOW (ref 3.87–5.11)
RDW: 13.4 % (ref 11.5–15.5)
WBC: 10.8 10*3/uL — ABNORMAL HIGH (ref 4.0–10.5)
nRBC: 0 % (ref 0.0–0.2)

## 2023-03-26 LAB — GLUCOSE, CAPILLARY
Glucose-Capillary: 124 mg/dL — ABNORMAL HIGH (ref 70–99)
Glucose-Capillary: 152 mg/dL — ABNORMAL HIGH (ref 70–99)
Glucose-Capillary: 154 mg/dL — ABNORMAL HIGH (ref 70–99)
Glucose-Capillary: 159 mg/dL — ABNORMAL HIGH (ref 70–99)
Glucose-Capillary: 170 mg/dL — ABNORMAL HIGH (ref 70–99)

## 2023-03-26 LAB — HEMOGLOBIN A1C
Hgb A1c MFr Bld: 6.5 % — ABNORMAL HIGH (ref 4.8–5.6)
Mean Plasma Glucose: 139.85 mg/dL

## 2023-03-26 LAB — PROTEIN, URINE, 24 HOUR
Collection Interval-UPROT: 24 hours
Protein, Urine: <6 mg/dL
Urine Total Volume-UPROT: 3600 mL

## 2023-03-26 MED ORDER — LABETALOL HCL 200 MG PO TABS
600.0000 mg | ORAL_TABLET | Freq: Three times a day (TID) | ORAL | Status: DC
  Administered 2023-03-26 – 2023-03-29 (×9): 600 mg via ORAL
  Filled 2023-03-26 (×9): qty 3

## 2023-03-26 MED ORDER — INSULIN ASPART 100 UNIT/ML IJ SOLN
0.0000 [IU] | Freq: Three times a day (TID) | INTRAMUSCULAR | Status: DC
  Administered 2023-03-26: 2 [IU] via SUBCUTANEOUS
  Administered 2023-03-26: 3 [IU] via SUBCUTANEOUS
  Administered 2023-03-26: 1 [IU] via SUBCUTANEOUS
  Administered 2023-03-27 (×3): 2 [IU] via SUBCUTANEOUS
  Administered 2023-03-28 (×2): 1 [IU] via SUBCUTANEOUS
  Administered 2023-03-29: 2 [IU] via SUBCUTANEOUS
  Administered 2023-03-29 (×2): 1 [IU] via SUBCUTANEOUS
  Administered 2023-03-30: 2 [IU] via SUBCUTANEOUS
  Administered 2023-03-30: 1 [IU] via SUBCUTANEOUS
  Administered 2023-03-30 – 2023-03-31 (×2): 2 [IU] via SUBCUTANEOUS
  Administered 2023-03-31: 1 [IU] via SUBCUTANEOUS
  Administered 2023-03-31: 2 [IU] via SUBCUTANEOUS
  Administered 2023-04-01: 1 [IU] via SUBCUTANEOUS
  Administered 2023-04-01 – 2023-04-02 (×3): 2 [IU] via SUBCUTANEOUS
  Administered 2023-04-02: 3 [IU] via SUBCUTANEOUS
  Administered 2023-04-02: 2 [IU] via SUBCUTANEOUS
  Administered 2023-04-03: 3 [IU] via SUBCUTANEOUS
  Administered 2023-04-03: 1 [IU] via SUBCUTANEOUS
  Administered 2023-04-03 – 2023-04-04 (×3): 2 [IU] via SUBCUTANEOUS
  Administered 2023-04-04: 1 [IU] via SUBCUTANEOUS
  Administered 2023-04-05: 2 [IU] via SUBCUTANEOUS
  Administered 2023-04-05: 1 [IU] via SUBCUTANEOUS
  Administered 2023-04-06 – 2023-04-07 (×5): 2 [IU] via SUBCUTANEOUS
  Administered 2023-04-07: 1 [IU] via SUBCUTANEOUS
  Administered 2023-04-08 (×2): 2 [IU] via SUBCUTANEOUS

## 2023-03-26 NOTE — Consult Note (Signed)
Maternal-Fetal Medicine   Name: Monique Jackson DOB: 15-Jun-1996 MRN: [id number] Referring Provider: Mitchel Honour, DO  I had the pleasure of seeing Monique Jackson today at the Center at the [facility]. She is G1 P0 at 33w 6d gestation with admitted for blood pressure control. She has the following high-risk problems: -Chronic hypertension.  Diagnosed at 12 to [redacted] weeks gestation.  Patient was taking labetalol 200 mg twice daily and her blood pressure is well-controlled.  At her prenatal visit yesterday at your office her blood pressure was 158/102 mmHg.  No increased proteinuria was seen.  Patient gives history of 5-pound weight gain over the last 3 days.  She has been taking low-dose aspirin prophylaxis.  At MAU evaluation, she had 3 severe range systolic blood pressure readings (160, 164, 166 mmHg).  No IV antihypertensive treatment was initiated.  She is currently on labetalol 600 mg 3 times daily. Patient does not have severe headache or visual disturbances or right upper quadrant pain.  She has mild epigastric discomfort.  Gestational diabetes.  Patient takes metformin for control.  She reports that before admission, her fasting levels have been below 95 mg/dL and postprandial levels below 130 mg/dL.  Her A1c at the beginning of pregnancy was 6.1%.  She had some hyperglycemia following betamethasone treatment and is on sliding scale insulin treatment.  Review of systems: She had 1 episode of vomiting yesterday that resolved.  No chest pain or palpitations.  She has bilateral carpal tunnel syndrome with the left wrist more affected.  Past medical history: No history of thyroid disorder or any other chronic medical conditions.  She does not have sickle cell trait. History of genital herpes and the patient is receiving valacyclovir prophylaxis. Past surgical history: Nil of note.  Allergies: No known drug allergies. Medications: Labetalol 600 mg 3 times daily, prenatal vitamins, sliding scale  insulin, valacyclovir, low-dose aspirin. Social history: Denies tobacco or drug or alcohol use.  She had discontinued marijuana use in pregnancy.  She is single and her partner is African-American, who is not involved in this pregnancy. Family history: Mother had pulmonary embolism and amputation of right leg.  Patient called her mother who says she has a clotting disorder.  P/E: Patient is comfortably sitting up; not in distress. Vitals: Blood pressure 144-163/83-94 mm Hg. Pulse 86/min. HEENT: Normal; no lymphadenopathy. Abdomen: Soft gravid uterus; no tenderness.  No flank tenderness. Pedal edema present. NST is reactive.  Labs: AST 23, ALT 22, hemoglobin 10.7, hematocrit 32.8, WBC 10.8, platelets 224, creatinine 0.71.  Protein/creatinine ratio normal.  Blood type a positive. Electrolytes normal except Na (134).  Ultrasound Fetal growth is appropriate for gestational age.  The estimated fetal weight is at the 19th percentile.  Amniotic fluid is normal good fetal activity seen.  Fetal anatomical survey is very limited because of advanced gestational age.  Cephalic presentation.  Our concerns include: Chronic hypertension with superimposed preeclampsia I discussed the diagnosis of chronic hypertension and a strong possibility of superimposed preeclampsia.  I explained the diagnosis of preeclampsia.  Possible maternal complications include stroke, eclampsia, endorgan failure, coagulation disturbances and placental abruption.  Goal is to institute aggressive antihypertensive treatment to keep the blood pressures in the mild hypertensive range.  I discussed the safety profile of labetalol and its maximum dosage (2400 mg daily). African-American women tend to have better response with the addition of nifedipine.  I recommend adding nifedipine XL 60 mg daily.  Timing of delivery: I counseled the patient that that if  her blood pressures are well-controlled on no increasing antihypertensive dosages  required after addition of nifedipine, we will plan delivery at [redacted] weeks gestation.  However, if blood pressures are not controlled with addition of nifedipine or she requires acute antihypertensive treatment, she should be delivered at [redacted] weeks gestation or at diagnosis after 34 weeks.  Patient agreed with my recommendations and understands that inpatient management will continue.  Gestational diabetes Her blood glucose did improve after cessation of betamethasone treatment.  Sliding scale insulin should continue.  Recommendations -Add nifedipine XL 60 mg daily.  If severe range blood pressure occurs despite treatment with additional antihypertensive, delivery should be considered. -BPP tomorrow.   Thank you for consultation.  If you have any questions or concerns, please contact me the [facility].  Consultation including face-to-face counseling 50 minutes.

## 2023-03-26 NOTE — Progress Notes (Signed)
-  Antenatal Progress Note-  No events overnight.  No HA, vision change, RUQ pain, CP/SOB.  Does report mild SOB with activity/ambulating in room.  Active FM.       03/26/2023    7:29 AM 03/26/2023    6:28 AM 03/26/2023    2:12 AM  Vitals with BMI  Systolic 150 145 324  Diastolic 86 88 83  Pulse 94 97 96      Latest Ref Rng & Units 03/26/2023    2:50 AM 03/25/2023   11:11 AM 06/11/2019   12:00 AM  CBC  WBC 4.0 - 10.5 K/uL 10.8  10.4  6.7   Hemoglobin 12.0 - 15.0 g/dL 40.1  02.7  25.3   Hematocrit 36.0 - 46.0 % 32.8  33.3  37.5   Platelets 150 - 400 K/uL 224  231  339       Latest Ref Rng & Units 03/26/2023    2:50 AM 03/25/2023   11:11 AM 03/31/2022    8:27 AM  CMP  Glucose 70 - 99 mg/dL 664  83  403   BUN 6 - 20 mg/dL 9  6  16    Creatinine 0.44 - 1.00 mg/dL 4.74  2.59  5.63   Sodium 135 - 145 mmol/L 134  136  138   Potassium 3.5 - 5.1 mmol/L 4.3  4.1  4.1   Chloride 98 - 111 mmol/L 103  105  106   CO2 22 - 32 mmol/L 20  22  27    Calcium 8.9 - 10.3 mg/dL 9.1  9.5  9.0   Total Protein 6.5 - 8.1 g/dL 6.1  5.9    Total Bilirubin 0.3 - 1.2 mg/dL 0.3  <8.7    Alkaline Phos 38 - 126 U/L 79  80    AST 15 - 41 U/L 23  20    ALT 0 - 44 U/L 22  18     CBG (last 3)  Recent Labs    03/25/23 2219 03/26/23 0212  GLUCAP 215* 159*   Gen: A&O x 3 Abd: soft, NT Ext: no c/c/e  26yo G1 at [redacted]w[redacted]d with likely exacerbation of CHTN for r/o SIPE -AM labs wnl -Increased labetalol from 400 TID to 600 TID this am to improve control -24 hour urine collecting -S/P u/s for EFW this am; results and MFM consult pending -NST q shift -A2DM-Appreciate DM coordinator input.  Metformin 500 BID and SSI to cover elevations secondary to BMZ -SCDs  Mitchel Honour, DO

## 2023-03-26 NOTE — Progress Notes (Signed)
Patient has no current c/o.  No HA, vision change, RUQ pain, CP/SOB. FHT Cat I Most recent BP lower than prior readings: 134/76 on labetalol 600 TID (adj dose this AM)  I reviewed MFM recs for inpatient until delivery and suspicion for SIPE. U/S this AM showed EFW 4#9 (19%), normal AFI and vertex presentation; recommendation for BPP tomorrow Dr. Judeth Cornfield also recommends addition of Procardia 60 XL every day.  Will hold on this for now given recent normal BP but patient is counseled this will be our next step for BP control.  24 hour urine will result tomorrow; will f/u  Mitchel Honour, DO

## 2023-03-26 NOTE — Inpatient Diabetes Management (Signed)
Inpatient Diabetes Program Recommendations  Diabetes Treatment Program Recommendations  ADA Standards of Care Diabetes in Pregnancy Target Glucose Ranges:  Fasting: 70 - 95 mg/dL 1 hr postprandial: Less than 140mg /dL (from first bite of meal) 2 hr postprandial: Less than 120 mg/dL (from first bite of meal)     Latest Reference Range & Units 03/25/23 16:57 03/25/23 22:19 03/26/23 02:12  Glucose-Capillary 70 - 99 mg/dL   Betamethasone 12 mg 093 (H)   Metformin XR 500 mg @22 :30  159 (H)    Latest Reference Range & Units 03/25/23 11:11 03/26/23 02:50  Glucose 70 - 99 mg/dL 83 235 (H)   Review of Glycemic Control  Diabetes history: DM2 Outpatient Diabetes medications: Fortamet 500 mg BID;  Metformin 1000 mg daily prior to pregnancy Current orders for Inpatient glycemic control: Metformin XR 500 mg BID, Novolog 0-15 units TID with meals; Betamethasone 12 mg Q24H x2   Inpatient Diabetes Program Recommendations:     Insulin: Please consider discontinuing the Novolog 0-15 units TID (per Glycemic Control order set) and using Diabetes Treatment for Pregnant/Postpartum Patients order set to order Novolog 0-14 units QID (fasting and 2 hour post prandial).  Thanks, Orlando Penner, RN, MSN, CDCES Diabetes Coordinator Inpatient Diabetes Program [phone] (Team Pager from 8am to 5pm)

## 2023-03-27 LAB — COMPREHENSIVE METABOLIC PANEL
ALT: 22 U/L (ref 0–44)
AST: 21 U/L (ref 15–41)
Albumin: 2.6 g/dL — ABNORMAL LOW (ref 3.5–5.0)
Alkaline Phosphatase: 87 U/L (ref 38–126)
Anion gap: 11 (ref 5–15)
BUN: 11 mg/dL (ref 6–20)
CO2: 19 mmol/L — ABNORMAL LOW (ref 22–32)
Calcium: 8.8 mg/dL — ABNORMAL LOW (ref 8.9–10.3)
Chloride: 105 mmol/L (ref 98–111)
Creatinine, Ser: 0.73 mg/dL (ref 0.44–1.00)
GFR, Estimated: >60 mL/min (ref >60)
Glucose, Bld: 120 mg/dL — ABNORMAL HIGH (ref 70–99)
Potassium: 4.1 mmol/L (ref 3.5–5.1)
Sodium: 135 mmol/L (ref 135–145)
Total Bilirubin: 0.5 mg/dL (ref 0.3–1.2)
Total Protein: 5.8 g/dL — ABNORMAL LOW (ref 6.5–8.1)

## 2023-03-27 LAB — CBC
HCT: 30.9 % — ABNORMAL LOW (ref 36.0–46.0)
Hemoglobin: 10.1 g/dL — ABNORMAL LOW (ref 12.0–15.0)
MCH: 28.1 pg (ref 26.0–34.0)
MCHC: 32.7 g/dL (ref 30.0–36.0)
MCV: 85.8 fL (ref 80.0–100.0)
Platelets: 220 10*3/uL (ref 150–400)
RBC: 3.6 MIL/uL — ABNORMAL LOW (ref 3.87–5.11)
RDW: 13.5 % (ref 11.5–15.5)
WBC: 16 10*3/uL — ABNORMAL HIGH (ref 4.0–10.5)
nRBC: 0.1 % (ref 0.0–0.2)

## 2023-03-27 LAB — GLUCOSE, CAPILLARY
Glucose-Capillary: 120 mg/dL — ABNORMAL HIGH (ref 70–99)
Glucose-Capillary: 147 mg/dL — ABNORMAL HIGH (ref 70–99)
Glucose-Capillary: 122 mg/dL — ABNORMAL HIGH (ref 70–99)
Glucose-Capillary: 145 mg/dL — ABNORMAL HIGH (ref 70–99)

## 2023-03-27 MED ORDER — FERROUS SULFATE 325 (65 FE) MG PO TABS
325.0000 mg | ORAL_TABLET | Freq: Every day | ORAL | Status: DC
  Administered 2023-03-28 – 2023-04-08 (×12): 325 mg via ORAL
  Filled 2023-03-27 (×13): qty 1

## 2023-03-27 MED ORDER — NIFEDIPINE ER OSMOTIC RELEASE 60 MG PO TB24
60.0000 mg | ORAL_TABLET | Freq: Every day | ORAL | Status: DC
  Administered 2023-03-27 – 2023-03-31 (×5): 60 mg via ORAL
  Filled 2023-03-27 (×5): qty 1

## 2023-03-27 NOTE — Progress Notes (Signed)
-  Antepartum Note-  No events overnight.  No HA, vision change, RUQ pain, CP/SOB.  Active FM.       03/27/2023    8:22 AM 03/27/2023    3:54 AM 03/26/2023   11:43 PM  Vitals with BMI  Systolic 148 151 098  Diastolic 86 91 84  Pulse 85 102 95      Latest Ref Rng & Units 03/27/2023    7:50 AM 03/26/2023    2:50 AM 03/25/2023   11:11 AM  CBC  WBC 4.0 - 10.5 K/uL 16.0  10.8  10.4   Hemoglobin 12.0 - 15.0 g/dL 11.9  14.7  82.9   Hematocrit 36.0 - 46.0 % 30.9  32.8  33.3   Platelets 150 - 400 K/uL 220  224  231       Latest Ref Rng & Units 03/27/2023    7:50 AM 03/26/2023    2:50 AM 03/25/2023   11:11 AM  CMP  Glucose 70 - 99 mg/dL 562  130  83   BUN 6 - 20 mg/dL 11  9  6    Creatinine 0.44 - 1.00 mg/dL 8.65  7.84  6.96   Sodium 135 - 145 mmol/L 135  134  136   Potassium 3.5 - 5.1 mmol/L 4.1  4.3  4.1   Chloride 98 - 111 mmol/L 105  103  105   CO2 22 - 32 mmol/L 19  20  22    Calcium 8.9 - 10.3 mg/dL 8.8  9.1  9.5   Total Protein 6.5 - 8.1 g/dL 5.8  6.1  5.9   Total Bilirubin 0.3 - 1.2 mg/dL 0.5  0.3  <2.9   Alkaline Phos 38 - 126 U/L 87  79  80   AST 15 - 41 U/L 21  23  20    ALT 0 - 44 U/L 22  22  18     CBG (last 3)  Recent Labs    03/26/23 2139 03/26/23 2344 03/27/23 0402  GLUCAP 170* 154* 120*   NST reactive  BPP last night 8/8  Gen: A&O x 3 Abd: soft, NT Ext: no c/c/e   26yo G1 at [redacted]w[redacted]d with likely exacerbation of CHTN for r/o SIPE -AM labs wnl -Continue labetalol 600 TID and first dose of Procardia 60XL every day ordered for 10 AM this am. -24 hour urine resulted below reportable range -NST q shift -A2DM-Appreciate DM coordinator input.  Metformin 500 BID and SSI to cover elevations secondary to BMZ -Anemia-FeSO4 -HSV-valtrex suppression -SCDs -NICU consult placed   Mitchel Honour, DO

## 2023-03-27 NOTE — Consult Note (Signed)
   Consultation Service: Neonatology   Dr. Langston Masker has asked for consultation on Monique Jackson regarding the care of a premature infant at 34 weeks. Thank you for inviting [place] to see this patient.   Reason for consult:  Explain the possible complications, the prognosis, and the care of a premature infant at 30 and 0/7 weeks.  Chief complaint: 27 y.o. female with a 34 IUP with an estimated weight of 2082 grams. Pregnancy has been complicated by  preeclampsia .  Plan is for delivery via vaginal delivery if possible.  My key findings of this patient's HPI are:  I have reviewed the patient's chart and have met with her. The salient information is as follows:   Mom is admitted to [facility] specialty care for hypertension management. Monitoring for fetal/maternal distress with no concerns at this time.   Prenatal labs:   Prenatal care:   good Pregnancy complications:  pre-eclampsia, gestational DM Maternal antibiotics: This patient's mother is not on file. Maternal Steroids: BMZ Most recent dose:  7/20   My recommendations for this patient and my actions included:   1. In the presence of the Monique Jackson, I spent 20 minutes discussing the possible complications and outcomes of prematurity at this gestational age. I discussed specific complications at this gestational age referencing the need for resuscitation at birth due to respiratory distress which may require mechanical ventilation, CPAP, and surfactant administration. In addition infant may require IV fluids pending establishment of enteral feeds (encouraged breast milk feeding), antibiotics for possible sepsis, temperature support, and continuous monitoring. I also discussed the potential risk of complications such as intracranial hemorrhage, hypoglycemia (and potential need for UVC placement), and hyperbilirubinemia. I discussed this with parents in detail and they expressed an understanding of the risks and complications of prematurity.   2.  I also discussed the expected survival of an infant born at 28 weeks, which is (Very good). We further discussed that (Very Few) of the neonates born at this age have profound or severe neurological complications and school difficulties. In addition, (Few) of the neonates born at this age will have some for of mild to moderate neurological complications. She expressed an understanding of this information.   3. I informed her that the NICU team would be present at the delivery. She agreed that all appropriate medical measures could be taken to resuscitate her infant at the delivery. She also understood that our team will always be available for any questions that come up during their infant's hospitalization and we will continue to partner with their family to support them through this difficult time. Visitation policy was discussed and all questions were addressed.   Final Impression:  27 y.o. female with a singleton female 44 week IUP who is threatening to deliver and who now understands the possible complications and prognosis of her infant. The mother agrees with plan for resuscitation and ICU care. Monique Jackson's questions were answered. She is planning to try and provide breast milk for her infant.    ______________________________________________________________________  Thank you for asking [place] to participate in the care of this patient. Please do not hesitate to contact us again if you are aware of any further ways we can be of assistance.   Sincerely,  Harlow Mares, MD Attending Neonatologist   I spent ~35 minutes in consultation time, of which 20 minutes was spent in direct face to face counseling.

## 2023-03-28 LAB — COMPREHENSIVE METABOLIC PANEL
ALT: 21 U/L (ref 0–44)
AST: 20 U/L (ref 15–41)
Albumin: 2.5 g/dL — ABNORMAL LOW (ref 3.5–5.0)
Alkaline Phosphatase: 76 U/L (ref 38–126)
Anion gap: 11 (ref 5–15)
BUN: 10 mg/dL (ref 6–20)
CO2: 20 mmol/L — ABNORMAL LOW (ref 22–32)
Calcium: 8.7 mg/dL — ABNORMAL LOW (ref 8.9–10.3)
Chloride: 103 mmol/L (ref 98–111)
Creatinine, Ser: 0.71 mg/dL (ref 0.44–1.00)
GFR, Estimated: >60 mL/min (ref >60)
Glucose, Bld: 107 mg/dL — ABNORMAL HIGH (ref 70–99)
Potassium: 3.7 mmol/L (ref 3.5–5.1)
Sodium: 134 mmol/L — ABNORMAL LOW (ref 135–145)
Total Bilirubin: 0.3 mg/dL (ref 0.3–1.2)
Total Protein: 5.4 g/dL — ABNORMAL LOW (ref 6.5–8.1)

## 2023-03-28 LAB — TYPE AND SCREEN
ABO/RH(D): A POS
Antibody Screen: NEGATIVE

## 2023-03-28 LAB — CBC
HCT: 28.9 % — ABNORMAL LOW (ref 36.0–46.0)
Hemoglobin: 9.5 g/dL — ABNORMAL LOW (ref 12.0–15.0)
MCH: 28.8 pg (ref 26.0–34.0)
MCHC: 32.9 g/dL (ref 30.0–36.0)
MCV: 87.6 fL (ref 80.0–100.0)
Platelets: 199 10*3/uL (ref 150–400)
RBC: 3.3 MIL/uL — ABNORMAL LOW (ref 3.87–5.11)
RDW: 13.6 % (ref 11.5–15.5)
WBC: 13.6 10*3/uL — ABNORMAL HIGH (ref 4.0–10.5)
nRBC: 0.3 % — ABNORMAL HIGH (ref 0.0–0.2)

## 2023-03-28 LAB — GLUCOSE, CAPILLARY
Glucose-Capillary: 89 mg/dL (ref 70–99)
Glucose-Capillary: 84 mg/dL (ref 70–99)
Glucose-Capillary: 98 mg/dL (ref 70–99)
Glucose-Capillary: 98 mg/dL (ref 70–99)
Glucose-Capillary: 89 mg/dL (ref 70–99)

## 2023-03-28 LAB — CULTURE, BETA STREP (GROUP B ONLY)

## 2023-03-28 NOTE — Progress Notes (Signed)
Mild frontal HA this am that comes/goes, no vision change  Today's Vitals   03/28/23 0034 03/28/23 0147 03/28/23 0234 03/28/23 0418  BP: 130/73   128/77  Pulse: 97   87  Resp: 19   18  Temp: 98.3 F (36.8 C)   98.1 F (36.7 C)  TempSrc: Oral   Oral  SpO2: 98%     Weight:      Height:      PainSc:  4  Asleep    Body mass index is 43.42 kg/m.   Abdomen soft, NT  NST reactive  Results for orders placed or performed during the hospital encounter of 03/25/23 (from the past 24 hour(s))  Glucose, capillary     Status: Abnormal   Collection Time: 03/27/23 10:33 AM  Result Value Ref Range   Glucose-Capillary 147 (H) 70 - 99 mg/dL  Glucose, capillary     Status: Abnormal   Collection Time: 03/27/23  2:58 PM  Result Value Ref Range   Glucose-Capillary 122 (H) 70 - 99 mg/dL  Glucose, capillary     Status: Abnormal   Collection Time: 03/27/23 11:00 PM  Result Value Ref Range   Glucose-Capillary 145 (H) 70 - 99 mg/dL  Type and screen [facility]     Status: None   Collection Time: 03/28/23  4:40 AM  Result Value Ref Range   ABO/RH(D) A POS    Antibody Screen NEG    Sample Expiration      03/31/2023,2359 Performed at [facility], [address], [place], [place] [address]   CBC     Status: Abnormal   Collection Time: 03/28/23  4:43 AM  Result Value Ref Range   WBC 13.6 (H) 4.0 - 10.5 K/uL   RBC 3.30 (L) 3.87 - 5.11 MIL/uL   Hemoglobin 9.5 (L) 12.0 - 15.0 g/dL   HCT 29.5 (L) 62.1 - 30.8 %   MCV 87.6 80.0 - 100.0 fL   MCH 28.8 26.0 - 34.0 pg   MCHC 32.9 30.0 - 36.0 g/dL   RDW 65.7 84.6 - 96.2 %   Platelets 199 150 - 400 K/uL   nRBC 0.3 (H) 0.0 - 0.2 %  Comprehensive metabolic panel     Status: Abnormal   Collection Time: 03/28/23  4:43 AM  Result Value Ref Range   Sodium 134 (L) 135 - 145 mmol/L   Potassium 3.7 3.5 - 5.1 mmol/L   Chloride 103 98 - 111 mmol/L   CO2 20 (L) 22 - 32 mmol/L   Glucose, Bld 107 (H) 70 - 99 mg/dL   BUN 10 6 - 20 mg/dL    Creatinine, Ser 9.52 0.44 - 1.00 mg/dL   Calcium 8.7 (L) 8.9 - 10.3 mg/dL   Total Protein 5.4 (L) 6.5 - 8.1 g/dL   Albumin 2.5 (L) 3.5 - 5.0 g/dL   AST 20 15 - 41 U/L   ALT 21 0 - 44 U/L   Alkaline Phosphatase 76 38 - 126 U/L   Total Bilirubin 0.3 0.3 - 1.2 mg/dL   GFR, Estimated >84 >13 mL/min   Anion gap 11 5 - 15  Glucose, capillary     Status: None   Collection Time: 03/28/23  6:39 AM  Result Value Ref Range   Glucose-Capillary 89 70 - 99 mg/dL    A/P: 34 1/7 wks  -GHTN-labetalol 600mg  TID, Procardia 60mg  every day             -most readings in  normal range after addition of Procardia             -labs normal  -A2GDM- metformin 500mg  BID, SS insulin after BMZ>glucose improving   -Hx HSV-Valtrex daily  -FWB-NST reactive, EFW 4# 9oz 03/26/23, BPP 8/8           -BMZ 7/17, 7/20           -NST every day            -NICU consult done  -Anemia- FeSO4  -SCDs

## 2023-03-29 LAB — CBC
HCT: 31.3 % — ABNORMAL LOW (ref 36.0–46.0)
Hemoglobin: 10.4 g/dL — ABNORMAL LOW (ref 12.0–15.0)
MCH: 27.9 pg (ref 26.0–34.0)
MCHC: 33.2 g/dL (ref 30.0–36.0)
MCV: 83.9 fL (ref 80.0–100.0)
Platelets: 217 10*3/uL (ref 150–400)
RBC: 3.73 MIL/uL — ABNORMAL LOW (ref 3.87–5.11)
RDW: 13.4 % (ref 11.5–15.5)
WBC: 11.8 10*3/uL — ABNORMAL HIGH (ref 4.0–10.5)
nRBC: 0.5 % — ABNORMAL HIGH (ref 0.0–0.2)

## 2023-03-29 LAB — GLUCOSE, CAPILLARY
Glucose-Capillary: 71 mg/dL (ref 70–99)
Glucose-Capillary: 98 mg/dL (ref 70–99)
Glucose-Capillary: 112 mg/dL — ABNORMAL HIGH (ref 70–99)
Glucose-Capillary: 158 mg/dL — ABNORMAL HIGH (ref 70–99)
Glucose-Capillary: 140 mg/dL — ABNORMAL HIGH (ref 70–99)

## 2023-03-29 LAB — COMPREHENSIVE METABOLIC PANEL
ALT: 21 U/L (ref 0–44)
AST: 20 U/L (ref 15–41)
Albumin: 2.6 g/dL — ABNORMAL LOW (ref 3.5–5.0)
Alkaline Phosphatase: 79 U/L (ref 38–126)
Anion gap: 10 (ref 5–15)
BUN: 11 mg/dL (ref 6–20)
CO2: 23 mmol/L (ref 22–32)
Calcium: 9 mg/dL (ref 8.9–10.3)
Chloride: 101 mmol/L (ref 98–111)
Creatinine, Ser: 0.76 mg/dL (ref 0.44–1.00)
GFR, Estimated: >60 mL/min (ref >60)
Glucose, Bld: 106 mg/dL — ABNORMAL HIGH (ref 70–99)
Potassium: 3.7 mmol/L (ref 3.5–5.1)
Sodium: 134 mmol/L — ABNORMAL LOW (ref 135–145)
Total Bilirubin: 0.1 mg/dL — ABNORMAL LOW (ref 0.3–1.2)
Total Protein: 5.6 g/dL — ABNORMAL LOW (ref 6.5–8.1)

## 2023-03-29 LAB — CULTURE, BETA STREP (GROUP B ONLY)

## 2023-03-29 MED ORDER — LABETALOL HCL 200 MG PO TABS
800.0000 mg | ORAL_TABLET | Freq: Three times a day (TID) | ORAL | Status: DC
  Administered 2023-03-29 – 2023-04-13 (×45): 800 mg via ORAL
  Filled 2023-03-29 (×45): qty 4

## 2023-03-29 NOTE — Progress Notes (Addendum)
Patient ID: Monique Jackson, female   DOB: [birth year], 27 y.o.   MRN: [id number]   Monique Jackson is without HA, [place], RUQ pain GFM   ital Sign Min/Max (last 24 hours)  Value Min Max  Temp 97.9 F (36.6 C) 98.5 F (36.9 C)  Pulse Rate 71 87  Resp 18 20  BP: Systolic 134 166 Abnormal   BP: Diastolic 73 90 Abnormal   SpO2 99 % 100 %   Labs normal today (PIH labs)  FHR 145 with accels  Abd Gravid nt DTR 1/4 Neg homans     A/P: 34 2/7 wks  -GHTN-increase to labetalol 800mg  TID, Procardia 60mg  every day             -some high  readings yesterday and seem to respond now with increasing dose of labetalol/Procardia             -labs have been normal  -A2GDM- metformin 500mg  BID, SS insulin after BMZ>glucose improving   -Hx HSV-Valtrex daily  -FWB-NST reactive, EFW 4# 9oz 03/26/23, BPP 8/8           -BMZ 7/17, 7/20           -NST every day  -BPP weekly           -NICU consult done  -Anemia- FeSO4  -SCDs Per MFM, if requires additional anti HTN after max oral meds, he recommends proceeding with delivery

## 2023-03-30 ENCOUNTER — Inpatient Hospital Stay (HOSPITAL_BASED_OUTPATIENT_CLINIC_OR_DEPARTMENT_OTHER): Payer: 59

## 2023-03-30 ENCOUNTER — Encounter: Payer: Self-pay | Admitting: Family Medicine

## 2023-03-30 DIAGNOSIS — Z3A34 34 weeks gestation of pregnancy: Secondary | ICD-10-CM

## 2023-03-30 DIAGNOSIS — O24414 Gestational diabetes mellitus in pregnancy, insulin controlled: Secondary | ICD-10-CM | POA: Diagnosis not present

## 2023-03-30 DIAGNOSIS — O99213 Obesity complicating pregnancy, third trimester: Secondary | ICD-10-CM | POA: Diagnosis not present

## 2023-03-30 DIAGNOSIS — O99820 Streptococcus B carrier state complicating pregnancy: Secondary | ICD-10-CM | POA: Insufficient documentation

## 2023-03-30 DIAGNOSIS — E669 Obesity, unspecified: Secondary | ICD-10-CM | POA: Diagnosis not present

## 2023-03-30 DIAGNOSIS — O10013 Pre-existing essential hypertension complicating pregnancy, third trimester: Secondary | ICD-10-CM | POA: Diagnosis not present

## 2023-03-30 DIAGNOSIS — O24415 Gestational diabetes mellitus in pregnancy, controlled by oral hypoglycemic drugs: Secondary | ICD-10-CM | POA: Diagnosis not present

## 2023-03-30 LAB — PROTEIN / CREATININE RATIO, URINE
Creatinine, Urine: 89 mg/dL
Protein Creatinine Ratio: 0.15 mg/mg{Cre} (ref 0.00–0.15)
Total Protein, Urine: 13 mg/dL

## 2023-03-30 LAB — GLUCOSE, CAPILLARY
Glucose-Capillary: 76 mg/dL (ref 70–99)
Glucose-Capillary: 96 mg/dL (ref 70–99)
Glucose-Capillary: 126 mg/dL — ABNORMAL HIGH (ref 70–99)
Glucose-Capillary: 125 mg/dL — ABNORMAL HIGH (ref 70–99)
Glucose-Capillary: 116 mg/dL — ABNORMAL HIGH (ref 70–99)

## 2023-03-30 NOTE — Progress Notes (Signed)
Antepartum Progress Note   Monique Jackson is a 27 y.o. female G1P0 that is admitted to [facility] for chronic hypertension with superimposed preeclampsia.  Overnight, she reports no acute events.  Admits fetal movement, denies contractions, denies leakage of fluid, denies vaginal bleeding.  She denies headache, vision changes, RUQ or epigastric pain.   History   Blood pressure (!) 147/87, pulse 81, temperature 98.2 F (36.8 C), temperature source Oral, resp. rate 17, height 5' 3.5" (1.613 m), weight 112.9 kg, last menstrual period 07/27/2022, SpO2 98%. Exam  Physical Exam: Gen: Alert, well appearing, no distress Chest: nonlabored breathing CV: no peripheral edema Abdomen: gravid, soft and nontender Ext: no evidence of DVT  FHT: 10x10 accels on last night's FHT.  Possible subtle late decel following contraction.   Assessment/Plan: Admitted to [facility] for cHTN with superimposed preeclampsia Labetalol 800 mg TID, Procardia XL 60 mg every day BP 147/87 this AM A2GDM: metformin 500 mg BID, SS insulin for hyperglycemia following BMZ Fasting BG improved at 76 this AM.  Will continue to trend. S/p BMZ 7/17-18 EFM and toco ordered daily, plan BPP this AM for only 10x10 accels last night.  EFW 19%ile at 4 lb 9 oz on 03/26/23 Diet: GDM DVT Ppx: SCDs Social work consult in place rent/transportation/work concerns. Dispo: inpatient management.  Appreciate MFM consultation. If BP's require IV antihypertensives, will proceed with delivery for severe features.    Lyn Henri 03/30/2023, 9:09 AM

## 2023-03-30 NOTE — Progress Notes (Signed)
Maternal-Fetal Medicine (Follow-up Consultation)  Ms. Monique Jackson, G1 P0 at 34w 3d gestation, was admitted with severe-range hypertension. She has a diagnosis of chronic hypertension and the recent severe-range hypertension that required higher doses of antihypertensives, is more-likely consistent with superimposed preeclampsia.  She does not have severe headache or visual disturbances or right upper quadrant pain or epigastric pain or vaginal bleeding.   Patient takes labetalol 800 mg tid and nifedipine XL 60 mg daily.  Labs including liver enzymes, creatinine and platelets are within normal range.  She has gestational diabetes and takes metformin and sliding-scale insulin.  Recent ultrasound showed normal fetal growth and reassuring BPP.  P/E: Patient is comfortably sitting up; not in distress. Vitals: BP range over 24 hours: 143-159/84-96 mm Hg, pulse 85/min. Abdomen: Soft gravid uterus; no tenderness. Pedal edema present. NST reassuring.  Chronic hypertension with superimposed preeclampsia I discussed the diagnosis and treatment with antihypertensives again. She takes maximum dose of labetalol (2,400 mg daily). If patient has severe-range hypertension (SBP 160 or more and/or DBP 110 or more), I recommend delivery to prevent maternal complications.  Patient was made aware that other criteria besides hypertension exist that will prompt delivery. She is concerned about the NICU census (closed for new deliveries) and does not want to be transferred to another hospital.  Recommendations -Recommend delivery if severe-range blood pressure recurs (blood pressure measurement as per our protocol).  -If NICU advises transfer and the patient does not want to be transferred, delivery can be delayed for about 24 hours with increase in nifedipine XL to 90 mg. However, this should be a shared decision with the patient, and she should be counseled that she would be delivered if NICU constraints do not  exist. Discussed with Dr. Lorane Gell.

## 2023-03-30 NOTE — Clinical SW OB High Risk (Signed)
OB Specialty Care  Clinical Social Worker:  Antionette Poles, [place] Date/Time:  03/30/2023, 2:56 PM Gestational Age on Admission:  27 y.o. Admitting Diagnosis:  exacerbation of CHTN    Expected Delivery Date:  05/08/23  [facility] Environment  Home Address:  [address] [place], [place] [address]  Household Member/Support Name:  Wonda Olds and best friend are supports.  Relationship:    Other Support:     Psychosocial Data  Employment:  Full-time  Type of Work: Veterinary surgeon  Education: Orthoptist to Consider  Concerns Related to Hospitalization:  Patient reported financial stress related to hospitalization. Patient reported needing assistance with rent, car payment, car insurance, and phone bill.    Previous Pregnancies/Feelings Towards Pregnancy?  Concerns related to being/becoming a mother?:  Patient reported that she is happy to be a mom and excited about what is to come. Patient shared that this has been a stressful pregnancy, noting it has been "brutal health wise". Patient shared that FOB will not be involved and she will be sole caregiver for infant.   Social Support (FOB? Who is/will be helping with baby/other kids?): Extended Family, Friends Patient reported that she is grateful for the support provided by her cousin and best friend.    Recent Stressful Life Events (life changes in past year?):  Patient reported stress surrounding finances due to being out of work because of hospitalization. Patient reported that she planned to work until the delivery.    Prenatal Care/Education/Home Preparations: Patient has been receiving adequate prenatal care at [facility].    Domestic Violence (of any type):  No   Substance Use During Pregnancy: No   Clinical Assessment/Plan:   CSW met with patient at bedside. CSW introduced self and explained role. Patient was welcoming, pleasant, open, talkative, and remained engaged  during assessment. 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 Patient reported that she receives [facility] and may apply for food stamps, noting she was denied the last time she applied. Patient reported having items needed to care for infant including 3 car seats, 2 bassinets, a crib, pack n play, and clothing. Patient expressed appreciation for her supports that have been assisting her with preparing for infant. CSW inquired about patient's support system, patient reported that her cousin and best friend are supports.   CSW inquired about patient's mental health history. Patient reported having a history of anxiety and shared that she self diagnosed herself with ADHD. Patient reported that she is participating in therapy and plans to have a session tomorrow. Patient endorsed having some depressive symptoms due to current situation. Patient shared that prior to pregnancy she utilized marijuana to cope with anxiety, noting it was helpful. Patient reported that she does not plan to start smoking again and explained that she has gained clarity since she stopped. CSW inquired about how patient has been feeling since being admitted patient reported stressed. CSW acknowledged, validated, and normalized patient's feelings. CSW and patient discussed the importance of patient decreasing stress levels for her health and health of infant. Patient spoke at length about her U[place]a. CSW actively listened and provided encouraging words.   CSW and patient discussed patient's financial stressors due to being out of work. CSW provided patient with financial assistance resources. Patient agreed to follow up with resources and thanked CSW. Patient denied having any transportation needs and explained that she just needed someone to drive her car home.   CSW explained  to patient that she will be assessed again once infant is born if he comes to the NICU, patient verbalized understanding.   Patient's  family members entered the room to visit.   CSW asked if patient was interested in CSW checking in tomorrow for support, patient reported yes and thanked CSW for support/resources.  Celso Sickle, LCSW Clinical Social Worker [facility] Cell#: [phone]

## 2023-03-30 NOTE — Progress Notes (Signed)
Labetalol 800mg  po found on patient's bedside table. Pt reminded to take med. Will recheck BP in one hour. Dr Lorane Gell notified.

## 2023-03-30 NOTE — Progress Notes (Signed)
BPP this AM 8/8.  Also, GBS has resulted and noted to be positive.

## 2023-03-31 DIAGNOSIS — F4323 Adjustment disorder with mixed anxiety and depressed mood: Secondary | ICD-10-CM | POA: Diagnosis not present

## 2023-03-31 LAB — GLUCOSE, CAPILLARY
Glucose-Capillary: 135 mg/dL — ABNORMAL HIGH (ref 70–99)
Glucose-Capillary: 149 mg/dL — ABNORMAL HIGH (ref 70–99)
Glucose-Capillary: 153 mg/dL — ABNORMAL HIGH (ref 70–99)
Glucose-Capillary: 86 mg/dL (ref 70–99)
Glucose-Capillary: 92 mg/dL (ref 70–99)

## 2023-03-31 LAB — COMPREHENSIVE METABOLIC PANEL
ALT: 21 U/L (ref 0–44)
AST: 17 U/L (ref 15–41)
Albumin: 2.6 g/dL — ABNORMAL LOW (ref 3.5–5.0)
Alkaline Phosphatase: 80 U/L (ref 38–126)
Anion gap: 10 (ref 5–15)
BUN: 14 mg/dL (ref 6–20)
CO2: 20 mmol/L — ABNORMAL LOW (ref 22–32)
Calcium: 8.8 mg/dL — ABNORMAL LOW (ref 8.9–10.3)
Chloride: 101 mmol/L (ref 98–111)
Creatinine, Ser: 0.7 mg/dL (ref 0.44–1.00)
GFR, Estimated: >60 mL/min (ref >60)
Glucose, Bld: 89 mg/dL (ref 70–99)
Potassium: 3.9 mmol/L (ref 3.5–5.1)
Sodium: 131 mmol/L — ABNORMAL LOW (ref 135–145)
Total Bilirubin: 0.4 mg/dL (ref 0.3–1.2)
Total Protein: 5.8 g/dL — ABNORMAL LOW (ref 6.5–8.1)

## 2023-03-31 LAB — TYPE AND SCREEN
ABO/RH(D): A POS
Antibody Screen: NEGATIVE

## 2023-03-31 LAB — CBC
HCT: 32.7 % — ABNORMAL LOW (ref 36.0–46.0)
Hemoglobin: 11 g/dL — ABNORMAL LOW (ref 12.0–15.0)
MCH: 28.1 pg (ref 26.0–34.0)
MCHC: 33.6 g/dL (ref 30.0–36.0)
MCV: 83.6 fL (ref 80.0–100.0)
Platelets: 228 10*3/uL (ref 150–400)
RBC: 3.91 MIL/uL (ref 3.87–5.11)
RDW: 13.5 % (ref 11.5–15.5)
WBC: 13.5 10*3/uL — ABNORMAL HIGH (ref 4.0–10.5)
nRBC: 0.3 % — ABNORMAL HIGH (ref 0.0–0.2)

## 2023-03-31 MED ORDER — NIFEDIPINE ER OSMOTIC RELEASE 60 MG PO TB24
60.0000 mg | ORAL_TABLET | Freq: Two times a day (BID) | ORAL | Status: DC
  Administered 2023-03-31 – 2023-04-12 (×25): 60 mg via ORAL
  Filled 2023-03-31 (×14): qty 1
  Filled 2023-03-31: qty 2
  Filled 2023-03-31 (×10): qty 1

## 2023-03-31 MED ORDER — CYCLOBENZAPRINE HCL 5 MG PO TABS
5.0000 mg | ORAL_TABLET | Freq: Three times a day (TID) | ORAL | Status: DC | PRN
  Administered 2023-03-31 – 2023-04-07 (×5): 5 mg via ORAL
  Filled 2023-03-31 (×5): qty 1

## 2023-03-31 NOTE — Progress Notes (Signed)
Request by patient to speak about plan Had 30 minute conversation  Read her Dr Zannie Kehr consult notes Plan is to stay in house  Delivery by 37 weeks Deliver earlier if BP uncontrollable because on max Labetalol She seems more relaxed with the plan and I informed her that I will pass this along to the [facility] Team

## 2023-03-31 NOTE — Progress Notes (Signed)
CSW updated patient at bedside regarding completion of healthcare verification form, Patient provided updates about application. Patient shared frustrations about hospitalization and unclear plan regarding delivery. CSW actively listened and provided encouraging words. Provider came in to speak with patient. CSW encouraged patient to contact CSW if any needs/concerns arise. Patient thanked CSW for checking in.   Monique Sickle, LCSW Clinical Social Worker [facility] Cell#: [phone]

## 2023-03-31 NOTE — Progress Notes (Signed)
Patient updated CSW that she completed the [employer] financial assistance application. CSW completed healthcare verification form. CSW will follow up with patient and provide update.   Celso Sickle, LCSW Clinical Social Worker [facility] Cell#: [phone]

## 2023-03-31 NOTE — Progress Notes (Signed)
IUP at 34 w 4 day  S:  This am, patient woke up with a headache and feeling some back pain. Normal FM.  O: BP 137/87 (BP Location: Left Arm)   Pulse 90   Temp 98.2 F (36.8 C) (Oral)   Resp 16   Ht 5' 3.5" (1.613 m)   Wt 112.9 kg   LMP 07/27/2022 (Exact Date)   SpO2 98%   BMI 43.42 kg/m  Results for orders placed or performed during the hospital encounter of 03/25/23 (from the past 24 hour(s))  Glucose, capillary     Status: None   Collection Time: 03/30/23 10:06 AM  Result Value Ref Range   Glucose-Capillary 96 70 - 99 mg/dL  Glucose, capillary     Status: Abnormal   Collection Time: 03/30/23  3:06 PM  Result Value Ref Range   Glucose-Capillary 126 (H) 70 - 99 mg/dL  Glucose, capillary     Status: Abnormal   Collection Time: 03/30/23  8:20 PM  Result Value Ref Range   Glucose-Capillary 125 (H) 70 - 99 mg/dL  Glucose, capillary     Status: Abnormal   Collection Time: 03/30/23 10:39 PM  Result Value Ref Range   Glucose-Capillary 116 (H) 70 - 99 mg/dL  Type and screen [facility]     Status: None   Collection Time: 03/31/23  5:15 AM  Result Value Ref Range   ABO/RH(D) A POS    Antibody Screen NEG    Sample Expiration      04/03/2023,2359 Performed at [facility], [address], [place], [place] [address]   CBC     Status: Abnormal   Collection Time: 03/31/23  7:00 AM  Result Value Ref Range   WBC 13.5 (H) 4.0 - 10.5 K/uL   RBC 3.91 3.87 - 5.11 MIL/uL   Hemoglobin 11.0 (L) 12.0 - 15.0 g/dL   HCT 18.8 (L) 41.6 - 60.6 %   MCV 83.6 80.0 - 100.0 fL   MCH 28.1 26.0 - 34.0 pg   MCHC 33.6 30.0 - 36.0 g/dL   RDW 30.1 60.1 - 09.3 %   Platelets 228 150 - 400 K/uL   nRBC 0.3 (H) 0.0 - 0.2 %  Comprehensive metabolic panel     Status: Abnormal   Collection Time: 03/31/23  7:00 AM  Result Value Ref Range   Sodium 131 (L) 135 - 145 mmol/L   Potassium 3.9 3.5 - 5.1 mmol/L   Chloride 101 98 - 111 mmol/L   CO2 20 (L) 22 - 32 mmol/L   Glucose, Bld 89 70  - 99 mg/dL   BUN 14 6 - 20 mg/dL   Creatinine, Ser 2.35 0.44 - 1.00 mg/dL   Calcium 8.8 (L) 8.9 - 10.3 mg/dL   Total Protein 5.8 (L) 6.5 - 8.1 g/dL   Albumin 2.6 (L) 3.5 - 5.0 g/dL   AST 17 15 - 41 U/L   ALT 21 0 - 44 U/L   Alkaline Phosphatase 80 38 - 126 U/L   Total Bilirubin 0.4 0.3 - 1.2 mg/dL   GFR, Estimated >57 >32 mL/min   Anion gap 10 5 - 15  Glucose, capillary     Status: None   Collection Time: 03/31/23  8:07 AM  Result Value Ref Range   Glucose-Capillary 86 70 - 99 mg/dL   Uterus non tender Edema unchanged   IMPRESSION: IUP at 34 w 4 days Chronic HTN with superimposed PIH A2DM  PLAN: Continue inpatient management  Status post steroids Currently on Labetalol 800 mg tid and procardia 60 mg XL Delivery at 37 weeks but earlier if BP difficult to control Flexeril and tylenol for headache and back spasm - will continue to monitor  MFM following and appreciate their recommendations

## 2023-04-01 LAB — GLUCOSE, CAPILLARY
Glucose-Capillary: 108 mg/dL — ABNORMAL HIGH (ref 70–99)
Glucose-Capillary: 144 mg/dL — ABNORMAL HIGH (ref 70–99)
Glucose-Capillary: 125 mg/dL — ABNORMAL HIGH (ref 70–99)
Glucose-Capillary: 119 mg/dL — ABNORMAL HIGH (ref 70–99)

## 2023-04-01 MED ORDER — OXYCODONE HCL 5 MG/5ML PO SOLN: 10.0000 mg | Freq: Four times a day (QID) | ORAL | Status: DC | PRN

## 2023-04-01 MED ORDER — ACETAMINOPHEN 325 MG PO TABS
325.0000 mg | ORAL_TABLET | Freq: Once | ORAL | Status: AC
  Administered 2023-04-01: 325 mg via ORAL
  Filled 2023-04-01: qty 1

## 2023-04-01 MED ORDER — HYDROMORPHONE HCL 1 MG/ML IJ SOLN
0.5000 mg | Freq: Once | INTRAMUSCULAR | Status: AC
  Administered 2023-04-01: 0.5 mg via INTRAVENOUS
  Filled 2023-04-01: qty 0.5

## 2023-04-01 MED ORDER — OXYCODONE HCL 5 MG PO TABS
10.0000 mg | ORAL_TABLET | Freq: Four times a day (QID) | ORAL | Status: DC | PRN
  Administered 2023-04-01 – 2023-04-05 (×5): 10 mg via ORAL
  Filled 2023-04-01 (×5): qty 2

## 2023-04-01 NOTE — Inpatient Diabetes Management (Signed)
Inpatient Diabetes Program Recommendations  AACE/ADA: New Consensus Statement on Inpatient Glycemic Control (2015)  Target Ranges:  Prepandial:   less than 140 mg/dL      Peak postprandial:   less than 180 mg/dL (1-2 hours)      Critically ill patients:  140 - 180 mg/dL   Lab Results  Component Value Date   GLUCAP 144 (H) 04/01/2023   HGBA1C 6.5 (H) 03/25/2023    Review of Glycemic Control  Latest Reference Range & Units 03/31/23 08:07 03/31/23 11:06 03/31/23 14:43 03/31/23 20:29 03/31/23 23:33 04/01/23 06:01 04/01/23 10:08  Glucose-Capillary 70 - 99 mg/dL 86 92 016 (H) 010 (H) 932 (H) 108 (H) 144 (H)  (H): Data is abnormally high  Diabetes history: DM2 Outpatient Diabetes medications: Fortamet 500 mg BID;  Metformin 1000 mg daily prior to pregnancy Current orders for Inpatient glycemic control: Novolog 0-14 units TID after meals, Metformin XR 500 mg BID  Inpatient Diabetes Program Recommendations:    Might consider:  Novolog 2 units TID with meals if she consumes at least 50%.  Continue Novolog 0-14 units TID 2 hr PP.  Will continue to follow while inpatient.  Thank you, Dulce Sellar, MSN, CDCES Diabetes Coordinator Inpatient Diabetes Program [phone] (team pager from 8a-5p)

## 2023-04-01 NOTE — Plan of Care (Signed)
  Problem: Clinical Measurements: Goal: Complications related to the disease process, condition or treatment will be avoided or minimized Outcome: Progressing   Problem: Education: Goal: Ability to describe self-care measures that may prevent or decrease complications (Diabetes Survival Skills Education) will improve Outcome: Progressing Goal: Individualized Educational Video(s) Outcome: Progressing   Problem: Health Behavior/Discharge Planning: Goal: Ability to identify and utilize available resources and services will improve Outcome: Progressing Goal: Ability to manage health-related needs will improve Outcome: Progressing

## 2023-04-01 NOTE — Progress Notes (Signed)
Initial Nutrition Assessment  DOCUMENTATION CODES:  Morbid obesity  INTERVENTION:  Carbohydrate modified gestational diabetic diet May order double protein portions at meals  NUTRITION DIAGNOSIS:   Increased nutrient needs related to  (pregnancy and fetal growth requirements) as evidenced by  (34 weeks IUP).  GOAL:   Patient will meet greater than or equal to 90% of their needs  MONITOR:  Labs  REASON FOR ASSESSMENT:   Antenatal, LOS   ASSESSMENT:   27 year old  now at 82 5/7 weeks IUP, adm with c-HTN, GDM. pre-pregnancy weight 100 kg, BMI 38.6. Weight gain 12.9 Kg &/25 Hct 32.7%, on FeSO4 supplement and prenatal vits. CBG's past 24 hours 108-149, insulin and metformin.  Delivery planned for 37 weeks unless earlier as dictated by clinical course   Diet Order:   Diet Order             Diet gestational carb mod Room service appropriate? Yes; Fluid consistency: Thin  Diet effective now                   EDUCATION NEEDS:   No education needs have been identified at this time  Skin:  Skin Assessment: Reviewed RN Assessment    Height:   Ht Readings from Last 1 Encounters:  03/25/23 5' 3.5" (1.613 m)    Weight:   Wt Readings from Last 1 Encounters:  03/25/23 112.9 kg    Ideal Body Weight:   117 lbs  BMI:  Body mass index is 43.42 kg/m.  Estimated Nutritional Needs:   Kcal:  2500-2700  Protein:  110-120 g  Fluid:  > 2.5 L

## 2023-04-01 NOTE — Progress Notes (Signed)
S: Doing well. Reports no HA, change in vision, SOB or worsening swelling. Understands plan of care which we again discussed in detail.   O:  Vitals:   03/31/23 2332 04/01/23 0354  BP: (!) 144/85 (!) 146/86  Pulse: 91 89  Resp: 18 18  Temp: 97.7 F (36.5 C) 98.1 F (36.7 C)  SpO2: 96% 98%   NAD, A&O NWOB Abd soft, nondistended, gravid  CBC    Component Value Date/Time   WBC 13.5 (H) 03/31/2023 0700   RBC 3.91 03/31/2023 0700   HGB 11.0 (L) 03/31/2023 0700   HCT 32.7 (L) 03/31/2023 0700   PLT 228 03/31/2023 0700   MCV 83.6 03/31/2023 0700   MCH 28.1 03/31/2023 0700   MCHC 33.6 03/31/2023 0700   RDW 13.5 03/31/2023 0700   LYMPHSABS 1,822 06/11/2019 0000   EOSABS 80 06/11/2019 0000   BASOSABS 60 06/11/2019 0000    CMP     Component Value Date/Time   NA 131 (L) 03/31/2023 0700   K 3.9 03/31/2023 0700   CL 101 03/31/2023 0700   CO2 20 (L) 03/31/2023 0700   GLUCOSE 89 03/31/2023 0700   BUN 14 03/31/2023 0700   CREATININE 0.70 03/31/2023 0700   CREATININE 0.76 06/11/2019 0000   CALCIUM 8.8 (L) 03/31/2023 0700   PROT 5.8 (L) 03/31/2023 0700   ALBUMIN 2.6 (L) 03/31/2023 0700   AST 17 03/31/2023 0700   ALT 21 03/31/2023 0700   ALKPHOS 80 03/31/2023 0700   BILITOT 0.4 03/31/2023 0700   GFR 101.44 03/31/2022 0827   GFRNONAA >60 03/31/2023 0700   GFRNONAA 111 06/11/2019 0000    A/P: 27 yo G1P0 @ 34.5 with cHTN and SIPE.   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

## 2023-04-02 ENCOUNTER — Inpatient Hospital Stay (HOSPITAL_COMMUNITY): Payer: 59

## 2023-04-02 DIAGNOSIS — O24415 Gestational diabetes mellitus in pregnancy, controlled by oral hypoglycemic drugs: Secondary | ICD-10-CM | POA: Diagnosis not present

## 2023-04-02 DIAGNOSIS — Z3A34 34 weeks gestation of pregnancy: Secondary | ICD-10-CM

## 2023-04-02 DIAGNOSIS — O10013 Pre-existing essential hypertension complicating pregnancy, third trimester: Secondary | ICD-10-CM | POA: Diagnosis not present

## 2023-04-02 DIAGNOSIS — O288 Other abnormal findings on antenatal screening of mother: Secondary | ICD-10-CM | POA: Diagnosis not present

## 2023-04-02 LAB — GLUCOSE, CAPILLARY
Glucose-Capillary: 105 mg/dL — ABNORMAL HIGH (ref 70–99)
Glucose-Capillary: 126 mg/dL — ABNORMAL HIGH (ref 70–99)
Glucose-Capillary: 164 mg/dL — ABNORMAL HIGH (ref 70–99)
Glucose-Capillary: 140 mg/dL — ABNORMAL HIGH (ref 70–99)

## 2023-04-02 MED ORDER — METOCLOPRAMIDE HCL 10 MG PO TABS
5.0000 mg | ORAL_TABLET | Freq: Once | ORAL | Status: AC
  Administered 2023-04-02: 5 mg via ORAL
  Filled 2023-04-02: qty 1

## 2023-04-02 MED ORDER — HYDROMORPHONE HCL 1 MG/ML IJ SOLN: 0.5000 mg | Freq: Once | INTRAMUSCULAR | Status: DC

## 2023-04-02 MED ORDER — DIPHENHYDRAMINE HCL 25 MG PO CAPS
25.0000 mg | ORAL_CAPSULE | Freq: Once | ORAL | Status: AC
  Administered 2023-04-02: 25 mg via ORAL
  Filled 2023-04-02: qty 1

## 2023-04-02 MED ORDER — LORATADINE 10 MG PO TABS
10.0000 mg | ORAL_TABLET | Freq: Every day | ORAL | Status: DC
  Administered 2023-04-02 – 2023-04-08 (×7): 10 mg via ORAL
  Filled 2023-04-02 (×7): qty 1

## 2023-04-02 MED ORDER — ACETAMINOPHEN 500 MG PO TABS
1000.0000 mg | ORAL_TABLET | Freq: Once | ORAL | Status: AC
  Administered 2023-04-02: 1000 mg via ORAL
  Filled 2023-04-02: qty 2

## 2023-04-02 NOTE — Evaluation (Addendum)
Physical Therapy Evaluation/Discharge Patient Details Name: Monique Jackson MRN: [id number] DOB: 12-07-1995 Today's Date: 04/02/2023  History of Present Illness  Pt is a 27 y.o. female admitted on 03/25/23 for HTN and superimposed preeclampsia (SIPE).  At time of PT eval she is ~[redacted] weeks pregnant.  She is remaining in the hospital on modified activity (bathroom and chair privileges) until delivery.  She has significant PMH of chronic low back pain (since her teens), gestational diabetes, HSV-2, and morbid obesity.  Clinical Impression  Pt was able to complete stretching and seated exercises with me to help with R sided low back/pelvis pain.  Exercise handout left in room, and make shift pelvic belt used for comfort.  Pt reports she is feeling much better than yesterday.  PT will sign off as all education was completed.     Assistance Recommended at Discharge None  If plan is discharge home, recommend the following:  Can travel by private vehicle    No PT follow up    Yes    Equipment Recommendations None recommended by PT  Recommendations for Other Services       Functional Status Assessment Patient has not had a recent decline in their functional status     Precautions / Restrictions Precautions Precautions: None      Mobility  Bed Mobility               General bed mobility comments: Pt was OOB seated in the recliner chair.    Transfers Overall transfer level: Independent                      Ambulation/Gait Ambulation/Gait assistance: Independent             General Gait Details: only observed walking around the room  Stairs            Wheelchair Mobility     Tilt Bed    Modified Rankin (Stroke Patients Only)       Balance                                             Pertinent Vitals/Pain Pain Assessment Pain Assessment: 0-10 Pain Score: 3  Pain Location: R posterior pelvis Pain Descriptors / Indicators:  Aching, Cramping Pain Intervention(s): Limited activity within patient's tolerance, Monitored during session, Repositioned, Heat applied    Home Living Family/patient expects to be discharged to:: Private residence   Available Help at Discharge: Family;Friend(s);Available PRN/intermittently                    Prior Function Prior Level of Function : Independent/Modified Independent             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 Dominance   Dominant Hand: Right    Extremity/Trunk Assessment   Upper Extremity Assessment Upper Extremity Assessment: Defer to OT evaluation    Lower Extremity Assessment Lower Extremity Assessment: Overall WFL for tasks assessed (bil LE edema, we talked about ankle pumps while seated)    Cervical / Trunk Assessment Cervical / Trunk Assessment: Other exceptions Cervical / Trunk Exceptions: trunk flex limited by baby and body habitus, extension WNL mild discomfort at end rang, rotation equal bil mild discomfort at end range, no radicular symptoms (pt reports radiated from R posterior pelvis up to her scapula  last night).  Lateral trunk flexion assessed in sitting WNL "good stretch" when sidebending to the left, no pain  Communication   Communication: No difficulties  Cognition Arousal/Alertness: Awake/alert Behavior During Therapy: WFL for tasks assessed/performed Overall Cognitive Status: Within Functional Limits for tasks assessed                                          General Comments      Exercises Antenatal Exercises Ankle Circles/Pumps: AROM, Both, Seated Quad Sets: AROM, Both, 10 reps (educated on, did not preform) Short Arc Quad: AROM, Both, 10 reps, Supine (educated on, did not preform) Sidelying Hip Flexor Stretch: Both, 5 reps, AROM (educated on, did not preform) Other Exercises Other Exercises: seated lateral flexion with extended overhead, seated trunk rotation stretch,  standing pelvic tilts (cat, cow, lateral flexion-"wag your tail"), pelvic compression attempted and decreased pain, so applied a pelvic support belt made from the flat sheet in the room.  Pt said it was very relieving.   Assessment/Plan    PT Assessment Patient does not need any further PT services  PT Problem List         PT Treatment Interventions      PT Goals (Current goals can be found in the Care Plan section)  Acute Rehab PT Goals Patient Stated Goal: to decrease pain PT Goal Formulation: All assessment and education complete, DC therapy    Frequency       Co-evaluation               AM-PAC PT "6 Clicks" Mobility  Outcome Measure Help needed turning from your back to your side while in a flat bed without using bedrails?: None Help needed moving from lying on your back to sitting on the side of a flat bed without using bedrails?: None Help needed moving to and from a bed to a chair (including a wheelchair)?: None Help needed standing up from a chair using your arms (e.g., wheelchair or bedside chair)?: None Help needed to walk in hospital room?: None Help needed climbing 3-5 steps with a railing? : None 6 Click Score: 24    End of Session   Activity Tolerance: Patient tolerated treatment well Patient left: in chair;with call bell/phone within reach;with family/visitor present Nurse Communication: Mobility status PT Visit Diagnosis: Pain Pain - Right/Left: Right Pain - part of body:  (low back)    Time: 1914-7829 PT Time Calculation (min) (ACUTE ONLY): 52 min   Charges:   PT Evaluation $PT Eval Moderate Complexity: 1 Mod PT Treatments $Therapeutic Exercise: 23-37 mins PT General Charges $$ ACUTE PT VISIT: 1 Visit       Corinna Capra, PT, DPT  Acute Rehabilitation Secure chat is best for contact #[phone] office

## 2023-04-02 NOTE — Progress Notes (Signed)
Headache resolved this morning. Pain is a zero currently. BPP 8/8

## 2023-04-02 NOTE — Progress Notes (Signed)
Patient just returned from ultrasound. Preliminary BPP called to nurse 8/8. Dr. Elon Spanner notified. Provider stated ok to keep off of the monitor for now.

## 2023-04-02 NOTE — Progress Notes (Signed)
S: Last night, had contractions. Felt them every 2 minutes. Needed oxycodone and dilaudid 0.5 and they resolved. Since getting the dilaudid she has had a HA and felt sleepy. But wasn't able to sleep bc of pain. Once contractions went away, she had right sided muscle pain. She denies change in vision, SOB or worsening swelling. Understands plan of care.  O:  Vitals:   04/02/23 0332 04/02/23 0335  BP:  121/72  Pulse: 82 84  Resp: 18   Temp: 97.6 F (36.4 C)   SpO2: 100%      NAD, A&O NWOB Abd soft, nondistended, gravid DTRs wnl SVE closed   A/P: 27 yo G1P0 @ 34.5 with cHTN and SIPE.   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 I reiterated importance of DVT proph. May need to start lovenox if non compliant with SCDs.   # Dispo OB specialty care   Belva Agee MD

## 2023-04-02 NOTE — Progress Notes (Signed)
Comfortably sleeping most of the day on and off.

## 2023-04-02 NOTE — Plan of Care (Signed)
  Problem: Clinical Measurements: Goal: Complications related to the disease process, condition or treatment will be avoided or minimized Outcome: Progressing   Problem: Education: Goal: Ability to describe self-care measures that may prevent or decrease complications (Diabetes Survival Skills Education) will improve Outcome: Progressing Goal: Individualized Educational Video(s) Outcome: Progressing   Problem: Health Behavior/Discharge Planning: Goal: Ability to identify and utilize available resources and services will improve Outcome: Progressing Goal: Ability to manage health-related needs will improve Outcome: Progressing   Problem: Metabolic: Goal: Ability to maintain appropriate glucose levels will improve Outcome: Progressing   Problem: Nutritional: Goal: Maintenance of adequate nutrition will improve Outcome: Progressing Goal: Progress toward achieving an optimal weight will improve Outcome: Progressing   Problem: Skin Integrity: Goal: Risk for impaired skin integrity will decrease Outcome: Progressing   Problem: Tissue Perfusion: Goal: Adequacy of tissue perfusion will improve Outcome: Progressing   Problem: Health Behavior/Discharge Planning: Goal: Ability to manage health-related needs will improve Outcome: Progressing   Problem: Clinical Measurements: Goal: Ability to maintain clinical measurements within normal limits will improve Outcome: Progressing Goal: Will remain free from infection Outcome: Progressing Goal: Diagnostic test results will improve Outcome: Progressing Goal: Respiratory complications will improve Outcome: Progressing Goal: Cardiovascular complication will be avoided Outcome: Progressing   Problem: Pain Managment: Goal: General experience of comfort will improve Outcome: Progressing   Problem: Safety: Goal: Ability to remain free from injury will improve Outcome: Progressing   Problem: Fluid Volume: Goal: Peripheral tissue  perfusion will improve Outcome: Progressing   Problem: Clinical Measurements: Goal: Complications related to disease process, condition or treatment will be avoided or minimized Outcome: Progressing

## 2023-04-03 ENCOUNTER — Inpatient Hospital Stay (HOSPITAL_COMMUNITY): Payer: 59

## 2023-04-03 DIAGNOSIS — Z3A35 35 weeks gestation of pregnancy: Secondary | ICD-10-CM | POA: Diagnosis not present

## 2023-04-03 DIAGNOSIS — O10013 Pre-existing essential hypertension complicating pregnancy, third trimester: Secondary | ICD-10-CM

## 2023-04-03 DIAGNOSIS — O24414 Gestational diabetes mellitus in pregnancy, insulin controlled: Secondary | ICD-10-CM | POA: Diagnosis not present

## 2023-04-03 LAB — CBC
HCT: 34 % — ABNORMAL LOW (ref 36.0–46.0)
Hemoglobin: 11.3 g/dL — ABNORMAL LOW (ref 12.0–15.0)
MCH: 28.9 pg (ref 26.0–34.0)
MCHC: 33.2 g/dL (ref 30.0–36.0)
MCV: 87 fL (ref 80.0–100.0)
Platelets: 231 10*3/uL (ref 150–400)
RBC: 3.91 MIL/uL (ref 3.87–5.11)
RDW: 14.5 % (ref 11.5–15.5)
WBC: 11.5 10*3/uL — ABNORMAL HIGH (ref 4.0–10.5)
nRBC: 0 % (ref 0.0–0.2)

## 2023-04-03 LAB — COMPREHENSIVE METABOLIC PANEL WITH GFR
ALT: 17 U/L (ref 0–44)
AST: 16 U/L (ref 15–41)
Albumin: 2.7 g/dL — ABNORMAL LOW (ref 3.5–5.0)
Alkaline Phosphatase: 88 U/L (ref 38–126)
Anion gap: 10 (ref 5–15)
BUN: 14 mg/dL (ref 6–20)
CO2: 22 mmol/L (ref 22–32)
Calcium: 9.5 mg/dL (ref 8.9–10.3)
Chloride: 102 mmol/L (ref 98–111)
Creatinine, Ser: 0.76 mg/dL (ref 0.44–1.00)
GFR, Estimated: >60 mL/min (ref >60)
Glucose, Bld: 111 mg/dL — ABNORMAL HIGH (ref 70–99)
Potassium: 4.2 mmol/L (ref 3.5–5.1)
Sodium: 134 mmol/L — ABNORMAL LOW (ref 135–145)
Total Bilirubin: 0.4 mg/dL (ref 0.3–1.2)
Total Protein: 5.9 g/dL — ABNORMAL LOW (ref 6.5–8.1)

## 2023-04-03 LAB — TYPE AND SCREEN
ABO/RH(D): A POS
Antibody Screen: NEGATIVE

## 2023-04-03 LAB — GLUCOSE, CAPILLARY
Glucose-Capillary: 91 mg/dL (ref 70–99)
Glucose-Capillary: 179 mg/dL — ABNORMAL HIGH (ref 70–99)
Glucose-Capillary: 149 mg/dL — ABNORMAL HIGH (ref 70–99)
Glucose-Capillary: 117 mg/dL — ABNORMAL HIGH (ref 70–99)
Glucose-Capillary: 126 mg/dL — ABNORMAL HIGH (ref 70–99)

## 2023-04-03 MED ORDER — PHENYLEPHRINE HCL 0.25 % NA SOLN
1.0000 | Freq: Four times a day (QID) | NASAL | Status: AC | PRN
  Filled 2023-04-03: qty 15

## 2023-04-03 NOTE — Plan of Care (Signed)
  Problem: Clinical Measurements: Goal: Complications related to the disease process, condition or treatment will be avoided or minimized Outcome: Progressing   Problem: Education: Goal: Ability to describe self-care measures that may prevent or decrease complications (Diabetes Survival Skills Education) will improve Outcome: Progressing Goal: Individualized Educational Video(s) Outcome: Progressing   Problem: Health Behavior/Discharge Planning: Goal: Ability to identify and utilize available resources and services will improve Outcome: Progressing Goal: Ability to manage health-related needs will improve Outcome: Progressing   Problem: Metabolic: Goal: Ability to maintain appropriate glucose levels will improve Outcome: Progressing   Problem: Nutritional: Goal: Maintenance of adequate nutrition will improve Outcome: Progressing Goal: Progress toward achieving an optimal weight will improve Outcome: Progressing   Problem: Skin Integrity: Goal: Risk for impaired skin integrity will decrease Outcome: Progressing   Problem: Tissue Perfusion: Goal: Adequacy of tissue perfusion will improve Outcome: Progressing   Problem: Health Behavior/Discharge Planning: Goal: Ability to manage health-related needs will improve Outcome: Progressing   Problem: Clinical Measurements: Goal: Ability to maintain clinical measurements within normal limits will improve Outcome: Progressing Goal: Will remain free from infection Outcome: Progressing Goal: Diagnostic test results will improve Outcome: Progressing   Problem: Pain Managment: Goal: General experience of comfort will improve Outcome: Progressing   Problem: Safety: Goal: Ability to remain free from injury will improve Outcome: Progressing   Problem: Fluid Volume: Goal: Peripheral tissue perfusion will improve Outcome: Progressing   Problem: Clinical Measurements: Goal: Complications related to disease process, condition or  treatment will be avoided or minimized Outcome: Progressing

## 2023-04-03 NOTE — Progress Notes (Addendum)
S: Last night she did well. Slept and had minimal pain. This am, she has some lower neck spasms due to "sleeping hard". Denies contractions, LOF and VB. +FM.  She denies HA, change in vision, SOB or worsening swelling. Understands plan of care.  O:  Vitals:   04/03/23 0305 04/03/23 0709  BP: (!) 143/82 (!) 146/92  Pulse: 97 92  Resp: 17 17  Temp: 98 F (36.7 C) 98.1 F (36.7 C)  SpO2: 99% 99%     NAD, A&O NWOB Abd soft, nondistended, gravid DTRs wnl   A/P: 27 yo G1P0 @ 35.0 wga with cHTN and SIPE.   # cHTN/SIPE Bps mostly mild range, ctm BPs PIH labs reassuring No s/s severe features IV anti-hypertensives prn persistent severe range Bps Continue Labetalol TID, Procardia XL 60mg , asa Mag when IOL if sooner than 37 wga  # Occ muscle spasms S/p PT, issues from yesterday resolved.  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

## 2023-04-04 ENCOUNTER — Inpatient Hospital Stay (HOSPITAL_COMMUNITY): Payer: 59

## 2023-04-04 LAB — GLUCOSE, CAPILLARY
Glucose-Capillary: 104 mg/dL — ABNORMAL HIGH (ref 70–99)
Glucose-Capillary: 111 mg/dL — ABNORMAL HIGH (ref 70–99)
Glucose-Capillary: 151 mg/dL — ABNORMAL HIGH (ref 70–99)
Glucose-Capillary: 134 mg/dL — ABNORMAL HIGH (ref 70–99)
Glucose-Capillary: 128 mg/dL — ABNORMAL HIGH (ref 70–99)

## 2023-04-04 MED ORDER — INSULIN ASPART 100 UNIT/ML IJ SOLN: 2.0000 [IU] | Freq: Three times a day (TID) | INTRAMUSCULAR | Status: DC

## 2023-04-04 NOTE — Progress Notes (Addendum)
Antepartum Progress Note  S: Feeling well this AM. Having some pain with movement at her IV site. No HA, RUQ pain, vision changes. No contractions, feeling good FM.  O:  Vitals:   04/04/23 0408 04/04/23 0800  BP: 136/85 (!) 142/80  Pulse: 85 85  Resp: 17 18  Temp: 98.2 F (36.8 C) 98.4 F (36.9 C)  SpO2: 97% 97%    NAD, A&O NWOB Abd soft, nondistended, gravid DTRs wnl NST yesterday AM not reactive, BPP reassuring. For NST today  A/P: 26 yo G1P0 at [redacted]w[redacted]d admitted for chronic HTN with superimposed preeclampsia, currently without severe features  # cHTN/SIPE Bps normotensive to mild range, closely monitor PIH labs reassuring No s/s severe features IV anti-hypertensives prn persistent severe range Bps Continue Labetalol TID, Procardia XL 60mg , asa Mag when IOL if severe features  # Occ muscle spasms S/p PT, resolved  # A2GDM  Metformin 500mg  BID and SSI  # FWB NST daily EFW 19%ile 4#9 on 03/26/23. Consider repeat growth prior to delivery.  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  Dispo: remain inpatient for BP monitoring until delivery We had long discussion re timing of delivery - she understands that if she does not develop any severe features and if her fetal monitoring remains reassuring, we will plan for delivery at 37wga. For planning purposes to have appropriate support from her family, she would like to schedule this - I will schedule her IOL for 37wga. She understands that any severe features would necessitate earlier delivery and that unfortunately can be quite unpredictable as far as timing. All questions answered.  Jule Economy, MD

## 2023-04-05 ENCOUNTER — Inpatient Hospital Stay (HOSPITAL_COMMUNITY): Payer: 59

## 2023-04-05 DIAGNOSIS — O10013 Pre-existing essential hypertension complicating pregnancy, third trimester: Secondary | ICD-10-CM | POA: Diagnosis not present

## 2023-04-05 DIAGNOSIS — O24415 Gestational diabetes mellitus in pregnancy, controlled by oral hypoglycemic drugs: Secondary | ICD-10-CM | POA: Diagnosis not present

## 2023-04-05 DIAGNOSIS — Z3A35 35 weeks gestation of pregnancy: Secondary | ICD-10-CM | POA: Diagnosis not present

## 2023-04-05 DIAGNOSIS — O113 Pre-existing hypertension with pre-eclampsia, third trimester: Secondary | ICD-10-CM

## 2023-04-05 LAB — COMPREHENSIVE METABOLIC PANEL WITH GFR
ALT: 18 U/L (ref 0–44)
AST: 16 U/L (ref 15–41)
Albumin: 2.6 g/dL — ABNORMAL LOW (ref 3.5–5.0)
Alkaline Phosphatase: 88 U/L (ref 38–126)
Anion gap: 9 (ref 5–15)
BUN: 12 mg/dL (ref 6–20)
CO2: 24 mmol/L (ref 22–32)
Calcium: 9.1 mg/dL (ref 8.9–10.3)
Chloride: 101 mmol/L (ref 98–111)
Creatinine, Ser: 0.7 mg/dL (ref 0.44–1.00)
GFR, Estimated: >60 mL/min
Glucose, Bld: 98 mg/dL (ref 70–99)
Potassium: 3.8 mmol/L (ref 3.5–5.1)
Sodium: 134 mmol/L — ABNORMAL LOW (ref 135–145)
Total Bilirubin: 0.5 mg/dL (ref 0.3–1.2)
Total Protein: 5.8 g/dL — ABNORMAL LOW (ref 6.5–8.1)

## 2023-04-05 LAB — CBC
HCT: 33.5 % — ABNORMAL LOW (ref 36.0–46.0)
Hemoglobin: 11.1 g/dL — ABNORMAL LOW (ref 12.0–15.0)
MCH: 28.2 pg (ref 26.0–34.0)
MCHC: 33.1 g/dL (ref 30.0–36.0)
MCV: 85 fL (ref 80.0–100.0)
Platelets: 222 10*3/uL (ref 150–400)
RBC: 3.94 MIL/uL (ref 3.87–5.11)
RDW: 14.3 % (ref 11.5–15.5)
WBC: 11.1 10*3/uL — ABNORMAL HIGH (ref 4.0–10.5)
nRBC: 0 % (ref 0.0–0.2)

## 2023-04-05 LAB — GLUCOSE, CAPILLARY
Glucose-Capillary: 125 mg/dL — ABNORMAL HIGH (ref 70–99)
Glucose-Capillary: 106 mg/dL — ABNORMAL HIGH (ref 70–99)
Glucose-Capillary: 87 mg/dL (ref 70–99)
Glucose-Capillary: 144 mg/dL — ABNORMAL HIGH (ref 70–99)

## 2023-04-05 MED ORDER — METFORMIN HCL ER 500 MG PO TB24
1000.0000 mg | ORAL_TABLET | Freq: Two times a day (BID) | ORAL | Status: DC
  Administered 2023-04-05 – 2023-04-08 (×7): 1000 mg via ORAL
  Filled 2023-04-05 (×8): qty 2

## 2023-04-05 NOTE — Inpatient Diabetes Management (Signed)
Inpatient Diabetes Program Recommendations  Diabetes Treatment Program Recommendations  ADA Standards of Care Diabetes in Pregnancy Target Glucose Ranges:  Fasting: 70 - 95 mg/dL 1 hr postprandial: Less than 140mg /dL (from first bite of meal) 2 hr postprandial: Less than 120 mg/dL (from first bite of meal)      Latest Reference Range & Units 04/05/23 08:02  Glucose-Capillary 70 - 99 mg/dL 161 (H)    Latest Reference Range & Units 04/04/23 07:58 04/04/23 11:45 04/04/23 16:50 04/04/23 20:02 04/04/23 23:18  Glucose-Capillary 70 - 99 mg/dL 096 (H) 045 (H) 409 (H) 134 (H) 128 (H)   Review of Glycemic Control  Current orders for Inpatient glycemic control: Novolog 0-14 units TID (2 hour post prandial), Metformin 500 mg BID  Inpatient Diabetes Program Recommendations:    Oral DM: Please consider increasing Metformin to 1000 mg BID.  Thanks Orlando Penner, RN, MSN, CDCES Diabetes Coordinator Inpatient Diabetes Program [phone] (Team Pager from 8am to 5pm)

## 2023-04-05 NOTE — Progress Notes (Signed)
HA this am relieved with Tylenol  Today's Vitals   04/05/23 0343 04/05/23 0750 04/05/23 0803 04/05/23 0910  BP: 131/76 133/88    Pulse: 88 97    Resp: 17 18    Temp: 97.9 F (36.6 C) 98.1 F (36.7 C)    TempSrc: Oral Oral    SpO2: 100% 98%    Weight:      Height:      PainSc:   6  4    Body mass index is 43.42 kg/m.   NAD, A&O NWOB Abd soft, nondistended, gravid DTRs wnl  A/P: # cHTN/SIPE Bps normotensive to mild range, closely monitor PIH labs reassuring No s/s severe features IV anti-hypertensives prn persistent severe range Bps Continue Labetalol TID, Procardia XL 60mg , asa Mag when IOL if severe features   # Occ muscle spasms S/p PT, resolved   # A2GDM  Metformin 500mg  BID and SSI   # FWB NST daily>will order BPP for today EFW 19%ile 4#9 on 03/26/23. Consider repeat growth prior to delivery.  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

## 2023-04-06 ENCOUNTER — Inpatient Hospital Stay (HOSPITAL_COMMUNITY): Payer: 59

## 2023-04-06 DIAGNOSIS — O368330 Maternal care for abnormalities of the fetal heart rate or rhythm, third trimester, not applicable or unspecified: Secondary | ICD-10-CM | POA: Diagnosis not present

## 2023-04-06 DIAGNOSIS — O113 Pre-existing hypertension with pre-eclampsia, third trimester: Secondary | ICD-10-CM | POA: Diagnosis not present

## 2023-04-06 DIAGNOSIS — O10013 Pre-existing essential hypertension complicating pregnancy, third trimester: Secondary | ICD-10-CM | POA: Diagnosis not present

## 2023-04-06 DIAGNOSIS — O24414 Gestational diabetes mellitus in pregnancy, insulin controlled: Secondary | ICD-10-CM

## 2023-04-06 DIAGNOSIS — Z3A35 35 weeks gestation of pregnancy: Secondary | ICD-10-CM

## 2023-04-06 LAB — GLUCOSE, CAPILLARY
Glucose-Capillary: 131 mg/dL — ABNORMAL HIGH (ref 70–99)
Glucose-Capillary: 133 mg/dL — ABNORMAL HIGH (ref 70–99)
Glucose-Capillary: 139 mg/dL — ABNORMAL HIGH (ref 70–99)
Glucose-Capillary: 148 mg/dL — ABNORMAL HIGH (ref 70–99)
Glucose-Capillary: 69 mg/dL — ABNORMAL LOW (ref 70–99)

## 2023-04-06 NOTE — Progress Notes (Signed)
Patient ID: Monique Jackson, female   DOB: 1995/12/29, 27 y.o.   MRN: [id number] Jaren reports GFM No HA, scotomata, RUQ pain  Vital Sign Min/Max (last 24 hours)  Value Min Max  Temp 97.9 F (36.6 C) 98.5 F (36.9 C)  Pulse Rate 82 101 Abnormal   Resp 17 18  BP: Systolic 137 152 Abnormal   BP: Diastolic 71 95 Abnormal   SpO2 99 % 100 %  FHR 140s with accels no decels PIH labs today wnl (mild anemia) Abd Gravid, nt DTRS 1/4 no clonus A/P: # cHTN/SIPE Bps normotensive , closely monitor PIH labs reassuring No s/s severe features IV anti-hypertensives prn persistent severe range Bps Per MFM, if requires IV meds, they rec IOL Continue Labetalol TID, Procardia XL 60mg , asa Mag when IOL if severe features   # Occ muscle spasms S/p PT, resolved   # A2GDM  Metformin 500mg  BID and SSI   # FWB EFM reassuring.  Normal BPP yesterday EFW 19%ile 4#9 on 03/26/23. Consider repeat growth prior to delivery.  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

## 2023-04-07 ENCOUNTER — Telehealth (HOSPITAL_COMMUNITY): Payer: Self-pay | Admitting: *Deleted

## 2023-04-07 LAB — GLUCOSE, CAPILLARY
Glucose-Capillary: 147 mg/dL — ABNORMAL HIGH (ref 70–99)
Glucose-Capillary: 78 mg/dL (ref 70–99)
Glucose-Capillary: 117 mg/dL — ABNORMAL HIGH (ref 70–99)
Glucose-Capillary: 151 mg/dL — ABNORMAL HIGH (ref 70–99)
Glucose-Capillary: 149 mg/dL — ABNORMAL HIGH (ref 70–99)
Glucose-Capillary: 143 mg/dL — ABNORMAL HIGH (ref 70–99)

## 2023-04-07 NOTE — Inpatient Diabetes Management (Signed)
Inpatient Diabetes Program Recommendations  Diabetes Treatment Program Recommendations  ADA Standards of Care Diabetes in Pregnancy Target Glucose Ranges:  Fasting: 70 - 95 mg/dL 1 hr postprandial: Less than 140mg /dL (from first bite of meal) 2 hr postprandial: Less than 120 mg/dL (from first bite of meal)      Latest Reference Range & Units 04/06/23 08:06 04/06/23 10:43 04/06/23 15:55 04/06/23 22:00 04/06/23 23:43 04/07/23 02:47 04/07/23 07:48  Glucose-Capillary 70 - 99 mg/dL 69 (L) 295 (H) 621 (H) 148 (H) 133 (H) 147 (H) 78   Review of Glycemic Control  Current orders for Inpatient glycemic control:  Novolog 0-14 units TID (2 hour post prandial), Metformin 1000 mg BID   Inpatient Diabetes Program Recommendations:    Insulin: Please consider ordering Novolog 2 units TID with meals for meal coverage if patient eats at least 50% of meals.  Thanks, Orlando Penner, RN, MSN, CDCES Diabetes Coordinator Inpatient Diabetes Program [phone] (Team Pager from 8am to 5pm)

## 2023-04-07 NOTE — Telephone Encounter (Signed)
Preadmission screen  

## 2023-04-07 NOTE — Progress Notes (Signed)
Patient ID: Monique Jackson, female   DOB: July 21, 1996, 27 y.o.   MRN: [id number] Feeling fine today. Has some muscle spasms in head and neck and back. FM. Denies ctns, LOF or vb.   Vital Sign Min/Max (last 24 hours)  Value Min Max  Temp 97.9 F (36.6 C) 98.5 F (36.9 C)  Pulse Rate 82 101 Abnormal   Resp 17 18  BP: Systolic 137 152 Abnormal   BP: Diastolic 71 95 Abnormal   SpO2 99 % 100 %   nwob Abd Gravid, nt DTRS fine   A/P: # cHTN/SIPE Bps normotensive , closely monitor PIH labs reassuring No s/s severe features IV anti-hypertensives prn persistent severe range Bps Per MFM, if requires IV meds, they rec IOL Continue Labetalol TID, Procardia XL 60mg , asa Mag when IOL if severe features   # Occ muscle spasms S/p PT, on and off still  # A2GDM  Metformin 500mg  BID and SSI   # FWB EFM reassuring.  Normal BPP yesterday EFW 19%ile 4#9 on 03/26/23. Consider repeat growth prior to delivery.  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

## 2023-04-08 LAB — GLUCOSE, CAPILLARY
Glucose-Capillary: 66 mg/dL — ABNORMAL LOW (ref 70–99)
Glucose-Capillary: 81 mg/dL (ref 70–99)
Glucose-Capillary: 126 mg/dL — ABNORMAL HIGH (ref 70–99)
Glucose-Capillary: 138 mg/dL — ABNORMAL HIGH (ref 70–99)
Glucose-Capillary: 99 mg/dL (ref 70–99)

## 2023-04-08 MED ORDER — LABETALOL HCL 5 MG/ML IV SOLN
80.0000 mg | INTRAVENOUS | Status: DC | PRN
  Administered 2023-04-08: 80 mg via INTRAVENOUS

## 2023-04-08 MED ORDER — LABETALOL HCL 5 MG/ML IV SOLN
40.0000 mg | INTRAVENOUS | Status: DC | PRN
  Administered 2023-04-08: 40 mg via INTRAVENOUS
  Filled 2023-04-08: qty 8

## 2023-04-08 MED ORDER — LACTATED RINGERS IV SOLN: INTRAVENOUS | Status: DC

## 2023-04-08 MED ORDER — LABETALOL HCL 5 MG/ML IV SOLN
80.0000 mg | INTRAVENOUS | Status: DC | PRN
  Filled 2023-04-08: qty 16

## 2023-04-08 MED ORDER — LABETALOL HCL 5 MG/ML IV SOLN: 20.0000 mg | INTRAVENOUS | Status: DC | PRN

## 2023-04-08 MED ORDER — LABETALOL HCL 5 MG/ML IV SOLN
20.0000 mg | INTRAVENOUS | Status: DC | PRN
  Administered 2023-04-08: 20 mg via INTRAVENOUS
  Filled 2023-04-08: qty 4

## 2023-04-08 MED ORDER — HYDRALAZINE HCL 20 MG/ML IJ SOLN: 10.0000 mg | INTRAMUSCULAR | Status: DC | PRN

## 2023-04-08 MED ORDER — HYDRALAZINE HCL 20 MG/ML IJ SOLN
10.0000 mg | INTRAMUSCULAR | Status: DC | PRN
  Administered 2023-04-08: 10 mg via INTRAVENOUS
  Filled 2023-04-08: qty 1

## 2023-04-08 MED ORDER — MAGNESIUM SULFATE 40 GM/1000ML IV SOLN
2.0000 g/h | INTRAVENOUS | Status: DC
  Administered 2023-04-08 – 2023-04-09 (×2): 2 g/h via INTRAVENOUS
  Filled 2023-04-08 (×2): qty 1000

## 2023-04-08 MED ORDER — LABETALOL HCL 5 MG/ML IV SOLN: 40.0000 mg | INTRAVENOUS | Status: DC | PRN

## 2023-04-08 MED ORDER — MAGNESIUM SULFATE BOLUS VIA INFUSION
4.0000 g | Freq: Once | INTRAVENOUS | Status: AC
  Administered 2023-04-08: 4 g via INTRAVENOUS
  Filled 2023-04-08: qty 1000

## 2023-04-08 NOTE — Progress Notes (Signed)
At bedside to check on patient.  She has had two persistent severe range BP.  BP (!) 166/101   Pulse 84   Temp 98 F (36.7 C) (Oral)   Resp 20   Ht 5' 3.5" (1.613 m)   Wt 112.9 kg   LMP 07/27/2022 (Exact Date)   SpO2 100%   BMI 43.42 kg/m    Will initiate IV labetalol protocol.  Per plan and recommendations, will proceed with induction for onset of severe features.   Discussed IOL process and will also start magnesium for seizure Ppx.   All questions answered.   Nilda Simmer

## 2023-04-08 NOTE — Progress Notes (Signed)
Nutrition Follow-up  DOCUMENTATION CODES:   Morbid obesity  INTERVENTION:  Continue carbohydrate modified gestational diabetic diet. May order double protein portions at meals.  Continue prenatal multivitamin with minerals po daily.  NUTRITION DIAGNOSIS:   Increased nutrient needs related to  (pregnancy and fetal growth requirements) as evidenced by  (34 weeks IUP).  Ongoing.  GOAL:   Patient will meet greater than or equal to 90% of their needs  Progressing with interventions.  MONITOR:   Labs  REASON FOR ASSESSMENT:   Antenatal, LOS    ASSESSMENT:   27 year old  now at 17 5/7 weeks IUP, adm with c-HTN, GDM. pre-pregnancy weight 100 kg, BMI 38.6. Weight gain 12.9 Kg Delivery planned for 37 weeks unless earlier as dictated by clinical course  Met with pt at bedside. She reports good appetite and intake. She is eating 100% of meals and snacks. Pt expresses occasionally still hungry between meals. She did not know she could order double protein. Encouraged pt to order double protein at meals. Reports occasional nausea and emesis several times per week.   No weight taken since admission to trend.  Medications reviewed and include: Colace, ferrous sulfate 325 mg daily, Novolog 0-14 units TID, metformin, prenatal MVI  Labs reviewed: CBG 66-151, Sodium 133, Hgb 10.9, Hct 33.2  NUTRITION - FOCUSED PHYSICAL EXAM:  No subcutaneous fat or muscle depletion  Diet Order:   Diet Order             Diet gestational carb mod Room service appropriate? Yes; Fluid consistency: Thin  Diet effective now                   EDUCATION NEEDS:   No education needs have been identified at this time  Skin:  Skin Assessment: Reviewed RN Assessment  Last BM:  04/04/23 per chart  Height:   Ht Readings from Last 1 Encounters:  03/25/23 5' 3.5" (1.613 m)   Weight:   Wt Readings from Last 1 Encounters:  03/25/23 112.9 kg   BMI:  Body mass index is 43.42 kg/m.  Estimated  Nutritional Needs:   Kcal:  2500-2700  Protein:  110-120 g  Fluid:  > 2.5 L   Tollie Eth, MS, RD, LDN, CNSC Pager number available on Amion

## 2023-04-08 NOTE — Progress Notes (Signed)
Antepartum Progress Note   Monique Jackson is a 27 y.o. female G1 that is admitted to [facility] for chronic hypertension with superimposed preeclampsia, today [redacted]w[redacted]d.  Overnight, she reports no acute events.  Admits fetal movement, denies contractions, denies leakage of fluid, denies vaginal bleeding. Denies headache, vision changes, RUQ pain.   History   Blood pressure (!) 150/91, pulse 90, temperature 98.2 F (36.8 C), temperature source Oral, resp. rate 17, height 5' 3.5" (1.613 m), weight 112.9 kg, last menstrual period 07/27/2022, SpO2 99%. Exam  Physical Exam: Gen: Alert, well appearing, no distress Chest: nonlabored breathing CV: no peripheral edema Abdomen: gravid, soft and nontender Ext: no evidence of DVT  Assessment/Plan: Admitted to [facility] for cHTN with SIPE BP normotensive and mild range.  Labetalol 800 mg TID, Procardia XL 60 mg.  ASA 81 until 36 weeks. In the event of severe features or severe range BP, plan for magnesium and induction EFM and toco reassuring EFW 19%ile.  Plan for repeat growth at 36 weeks.  Diet: Reg DVT Ppx: SCDs A2GDM: Metformin 500 mg BID, SSI. Hx HSV - no outbreaks, continue Valtrex. GBS positive - PCN in labor.    Lyn Henri 04/08/2023, 8:30 AM

## 2023-04-08 NOTE — Inpatient Diabetes Management (Signed)
Inpatient Diabetes Program Recommendations  Diabetes Treatment Program Recommendations  ADA Standards of Care Diabetes in Pregnancy Target Glucose Ranges:  Fasting: 70 - 95 mg/dL 1 hr postprandial: Less than 140mg /dL (from first bite of meal) 2 hr postprandial: Less than 120 mg/dL (from first bite of meal)     Latest Reference Range & Units 04/07/23 02:47 04/07/23 07:48 04/07/23 10:42 04/07/23 15:48 04/07/23 21:19 04/07/23 23:12  Glucose-Capillary 70 - 99 mg/dL 119 (H) 78 147 (H) 829 (H) 149 (H) 143 (H)  (H): Data is abnormally high   Review of Glycemic Control  Current orders for Inpatient glycemic control:  Novolog 0-14 units TID (2 hour post prandial), Metformin 1000 mg BID    Inpatient Diabetes Program Recommendations:     Insulin: If postprandial glucose is consistently over 120 mg/dl, please consider ordering Novolog 2 units TID with meals for meal coverage if patient eats at least 50% of meals.  Thanks, Orlando Penner, RN, MSN, CDCES Diabetes Coordinator Inpatient Diabetes Program [phone] (Team Pager from 8am to 5pm)

## 2023-04-09 ENCOUNTER — Inpatient Hospital Stay (HOSPITAL_COMMUNITY): Payer: 59 | Admitting: Anesthesiology

## 2023-04-09 ENCOUNTER — Encounter (HOSPITAL_COMMUNITY)
Admission: AD | Disposition: A | Discharge status: Home / Self Care | Payer: Self-pay | Source: Home / Self Care | Attending: Obstetrics & Gynecology

## 2023-04-09 ENCOUNTER — Encounter (HOSPITAL_COMMUNITY): Payer: Self-pay | Admitting: Obstetrics & Gynecology

## 2023-04-09 DIAGNOSIS — Z3A35 35 weeks gestation of pregnancy: Secondary | ICD-10-CM

## 2023-04-09 LAB — CBC
HCT: 32.7 % — ABNORMAL LOW (ref 36.0–46.0)
HCT: 37 % (ref 36.0–46.0)
Hemoglobin: 11.2 g/dL — ABNORMAL LOW (ref 12.0–15.0)
Hemoglobin: 12.2 g/dL (ref 12.0–15.0)
MCH: 29.6 pg (ref 26.0–34.0)
MCH: 28.8 pg (ref 26.0–34.0)
MCHC: 34.3 g/dL (ref 30.0–36.0)
MCHC: 33 g/dL (ref 30.0–36.0)
MCV: 86.3 fL (ref 80.0–100.0)
MCV: 87.5 fL (ref 80.0–100.0)
Platelets: 183 10*3/uL (ref 150–400)
Platelets: 205 10*3/uL (ref 150–400)
RBC: 3.79 MIL/uL — ABNORMAL LOW (ref 3.87–5.11)
RBC: 4.23 MIL/uL (ref 3.87–5.11)
RDW: 14.6 % (ref 11.5–15.5)
RDW: 14.9 % (ref 11.5–15.5)
WBC: 9.4 10*3/uL (ref 4.0–10.5)
WBC: 10.8 10*3/uL — ABNORMAL HIGH (ref 4.0–10.5)
nRBC: 0 % (ref 0.0–0.2)
nRBC: 0 % (ref 0.0–0.2)

## 2023-04-09 LAB — GLUCOSE, CAPILLARY
Glucose-Capillary: 110 mg/dL — ABNORMAL HIGH (ref 70–99)
Glucose-Capillary: 89 mg/dL (ref 70–99)
Glucose-Capillary: 90 mg/dL (ref 70–99)
Glucose-Capillary: 106 mg/dL — ABNORMAL HIGH (ref 70–99)
Glucose-Capillary: 99 mg/dL (ref 70–99)

## 2023-04-09 LAB — COMPREHENSIVE METABOLIC PANEL
ALT: 19 U/L (ref 0–44)
AST: 20 U/L (ref 15–41)
Albumin: 2.5 g/dL — ABNORMAL LOW (ref 3.5–5.0)
Alkaline Phosphatase: 94 U/L (ref 38–126)
Anion gap: 11 (ref 5–15)
BUN: 15 mg/dL (ref 6–20)
CO2: 19 mmol/L — ABNORMAL LOW (ref 22–32)
Calcium: 8.8 mg/dL — ABNORMAL LOW (ref 8.9–10.3)
Chloride: 102 mmol/L (ref 98–111)
Creatinine, Ser: 0.81 mg/dL (ref 0.44–1.00)
GFR, Estimated: >60 mL/min (ref >60)
Glucose, Bld: 115 mg/dL — ABNORMAL HIGH (ref 70–99)
Potassium: 4 mmol/L (ref 3.5–5.1)
Sodium: 132 mmol/L — ABNORMAL LOW (ref 135–145)
Total Bilirubin: 0.4 mg/dL (ref 0.3–1.2)
Total Protein: 5.6 g/dL — ABNORMAL LOW (ref 6.5–8.1)

## 2023-04-09 SURGERY — Surgical Case
Anesthesia: Regional

## 2023-04-09 MED ORDER — TERBUTALINE SULFATE 1 MG/ML IJ SOLN: 0.2500 mg | Freq: Once | INTRAMUSCULAR | Status: DC | PRN

## 2023-04-09 MED ORDER — HYDROXYZINE HCL 50 MG PO TABS: 50.0000 mg | ORAL_TABLET | Freq: Four times a day (QID) | ORAL | Status: DC | PRN

## 2023-04-09 MED ORDER — MORPHINE SULFATE (PF) 0.5 MG/ML IJ SOLN
INTRAMUSCULAR | Status: DC | PRN
  Administered 2023-04-09: 150 ug via INTRATHECAL

## 2023-04-09 MED ORDER — SCOPOLAMINE 1 MG/3DAYS TD PT72
1.0000 | MEDICATED_PATCH | Freq: Once | TRANSDERMAL | Status: AC
  Administered 2023-04-09: 1.5 mg via TRANSDERMAL
  Filled 2023-04-09: qty 1

## 2023-04-09 MED ORDER — MEPERIDINE HCL 25 MG/ML IJ SOLN: 6.2500 mg | INTRAMUSCULAR | Status: DC | PRN

## 2023-04-09 MED ORDER — DIBUCAINE (PERIANAL) 1 % EX OINT: 1.0000 | TOPICAL_OINTMENT | CUTANEOUS | Status: DC | PRN

## 2023-04-09 MED ORDER — DEXAMETHASONE SODIUM PHOSPHATE 4 MG/ML IJ SOLN
INTRAMUSCULAR | Status: AC
  Filled 2023-04-09: qty 2

## 2023-04-09 MED ORDER — ACETAMINOPHEN 325 MG PO TABS
650.0000 mg | ORAL_TABLET | Freq: Four times a day (QID) | ORAL | Status: DC | PRN
  Administered 2023-04-09: 650 mg via ORAL
  Filled 2023-04-09: qty 2

## 2023-04-09 MED ORDER — LACTATED RINGERS IV SOLN: 500.0000 mL | INTRAVENOUS | Status: DC | PRN

## 2023-04-09 MED ORDER — INSULIN ASPART 100 UNIT/ML IJ SOLN
0.0000 [IU] | INTRAMUSCULAR | Status: DC
  Administered 2023-04-09 (×2): 1 [IU] via SUBCUTANEOUS

## 2023-04-09 MED ORDER — SODIUM CHLORIDE 0.9 % IV SOLN
INTRAVENOUS | Status: DC | PRN
  Administered 2023-04-09: 500 mg via INTRAVENOUS

## 2023-04-09 MED ORDER — OXYCODONE HCL 5 MG/5ML PO SOLN: 5.0000 mg | Freq: Once | ORAL | Status: DC | PRN

## 2023-04-09 MED ORDER — SENNOSIDES-DOCUSATE SODIUM 8.6-50 MG PO TABS
2.0000 | ORAL_TABLET | Freq: Every day | ORAL | Status: DC
  Administered 2023-04-10 – 2023-04-12 (×3): 2 via ORAL
  Filled 2023-04-09 (×3): qty 2

## 2023-04-09 MED ORDER — KETOROLAC TROMETHAMINE 30 MG/ML IJ SOLN: 30.0000 mg | Freq: Four times a day (QID) | INTRAMUSCULAR | Status: AC | PRN

## 2023-04-09 MED ORDER — HYDRALAZINE HCL 20 MG/ML IJ SOLN: 10.0000 mg | INTRAMUSCULAR | Status: DC | PRN

## 2023-04-09 MED ORDER — PHENYLEPHRINE HCL (PRESSORS) 10 MG/ML IV SOLN
INTRAVENOUS | Status: DC | PRN
  Administered 2023-04-09: 80 ug via INTRAVENOUS

## 2023-04-09 MED ORDER — VALACYCLOVIR HCL 500 MG PO TABS
500.0000 mg | ORAL_TABLET | Freq: Two times a day (BID) | ORAL | Status: DC
  Administered 2023-04-09 (×2): 500 mg via ORAL
  Filled 2023-04-09 (×2): qty 1

## 2023-04-09 MED ORDER — HYDROMORPHONE HCL 1 MG/ML IJ SOLN
0.2500 mg | INTRAMUSCULAR | Status: DC | PRN
  Administered 2023-04-09: 0.5 mg via INTRAVENOUS

## 2023-04-09 MED ORDER — DIPHENHYDRAMINE HCL 50 MG/ML IJ SOLN: 12.5000 mg | INTRAMUSCULAR | Status: DC | PRN

## 2023-04-09 MED ORDER — OXYTOCIN BOLUS FROM INFUSION: 333.0000 mL | Freq: Once | INTRAVENOUS | Status: DC

## 2023-04-09 MED ORDER — OXYTOCIN-SODIUM CHLORIDE 30-0.9 UT/500ML-% IV SOLN: 2.5000 [IU]/h | INTRAVENOUS | Status: AC

## 2023-04-09 MED ORDER — ONDANSETRON HCL 4 MG/2ML IJ SOLN
4.0000 mg | Freq: Three times a day (TID) | INTRAMUSCULAR | Status: DC | PRN
  Administered 2023-04-09: 4 mg via INTRAVENOUS
  Filled 2023-04-09: qty 2

## 2023-04-09 MED ORDER — OXYCODONE-ACETAMINOPHEN 5-325 MG PO TABS: 2.0000 | ORAL_TABLET | ORAL | Status: DC | PRN

## 2023-04-09 MED ORDER — PHENYLEPHRINE 80 MCG/ML (10ML) SYRINGE FOR IV PUSH (FOR BLOOD PRESSURE SUPPORT): 80.0000 ug | PREFILLED_SYRINGE | INTRAVENOUS | Status: DC | PRN

## 2023-04-09 MED ORDER — DEXAMETHASONE SODIUM PHOSPHATE 10 MG/ML IJ SOLN
INTRAMUSCULAR | Status: DC | PRN
  Administered 2023-04-09: 8 mg via INTRAVENOUS

## 2023-04-09 MED ORDER — EPHEDRINE 5 MG/ML INJ: 10.0000 mg | INTRAVENOUS | Status: DC | PRN

## 2023-04-09 MED ORDER — FENTANYL CITRATE (PF) 100 MCG/2ML IJ SOLN
INTRAMUSCULAR | Status: DC | PRN
  Administered 2023-04-09: 15 ug via INTRATHECAL

## 2023-04-09 MED ORDER — DIPHENHYDRAMINE HCL 25 MG PO CAPS: 25.0000 mg | ORAL_CAPSULE | ORAL | Status: DC | PRN

## 2023-04-09 MED ORDER — STERILE WATER FOR IRRIGATION IR SOLN
Status: DC | PRN
  Administered 2023-04-09: 1000 mL

## 2023-04-09 MED ORDER — LACTATED RINGERS IV SOLN: INTRAVENOUS | Status: DC

## 2023-04-09 MED ORDER — SCOPOLAMINE 1 MG/3DAYS TD PT72
MEDICATED_PATCH | TRANSDERMAL | Status: AC
  Filled 2023-04-09: qty 1

## 2023-04-09 MED ORDER — OXYTOCIN-SODIUM CHLORIDE 30-0.9 UT/500ML-% IV SOLN
INTRAVENOUS | Status: DC | PRN
  Administered 2023-04-09: 300 mL via INTRAVENOUS

## 2023-04-09 MED ORDER — SODIUM CHLORIDE 0.9 % IV SOLN
5.0000 10*6.[IU] | Freq: Once | INTRAVENOUS | Status: AC
  Administered 2023-04-09: 5 10*6.[IU] via INTRAVENOUS
  Filled 2023-04-09: qty 5

## 2023-04-09 MED ORDER — LABETALOL HCL 5 MG/ML IV SOLN: 20.0000 mg | INTRAVENOUS | Status: DC | PRN

## 2023-04-09 MED ORDER — ACETAMINOPHEN 10 MG/ML IV SOLN
INTRAVENOUS | Status: AC
  Filled 2023-04-09: qty 100

## 2023-04-09 MED ORDER — OXYTOCIN-SODIUM CHLORIDE 30-0.9 UT/500ML-% IV SOLN
INTRAVENOUS | Status: AC
  Filled 2023-04-09: qty 500

## 2023-04-09 MED ORDER — BUPIVACAINE IN DEXTROSE 0.75-8.25 % IT SOLN
INTRATHECAL | Status: DC | PRN
  Administered 2023-04-09: 1.5 mL via INTRATHECAL

## 2023-04-09 MED ORDER — FENTANYL-BUPIVACAINE-NACL 0.5-0.125-0.9 MG/250ML-% EP SOLN: 12.0000 mL/h | EPIDURAL | Status: DC | PRN

## 2023-04-09 MED ORDER — KETOROLAC TROMETHAMINE 30 MG/ML IJ SOLN: 30.0000 mg | Freq: Once | INTRAMUSCULAR | Status: DC | PRN

## 2023-04-09 MED ORDER — CEFAZOLIN SODIUM-DEXTROSE 2-4 GM/100ML-% IV SOLN
INTRAVENOUS | Status: AC
  Filled 2023-04-09: qty 100

## 2023-04-09 MED ORDER — SODIUM CHLORIDE 0.9% FLUSH: 3.0000 mL | INTRAVENOUS | Status: DC | PRN

## 2023-04-09 MED ORDER — SOD CITRATE-CITRIC ACID 500-334 MG/5ML PO SOLN
30.0000 mL | ORAL | Status: DC | PRN
  Administered 2023-04-09: 30 mL via ORAL
  Filled 2023-04-09: qty 30

## 2023-04-09 MED ORDER — MENTHOL 3 MG MT LOZG: 1.0000 | LOZENGE | OROMUCOSAL | Status: DC | PRN

## 2023-04-09 MED ORDER — SIMETHICONE 80 MG PO CHEW: 80.0000 mg | CHEWABLE_TABLET | ORAL | Status: DC | PRN

## 2023-04-09 MED ORDER — LIDOCAINE HCL (PF) 1 % IJ SOLN: 30.0000 mL | INTRAMUSCULAR | Status: DC | PRN

## 2023-04-09 MED ORDER — OXYTOCIN-SODIUM CHLORIDE 30-0.9 UT/500ML-% IV SOLN: 2.5000 [IU]/h | INTRAVENOUS | Status: DC

## 2023-04-09 MED ORDER — PENICILLIN G POT IN DEXTROSE 60000 UNIT/ML IV SOLN
3.0000 10*6.[IU] | INTRAVENOUS | Status: DC
  Administered 2023-04-09 (×2): 3 10*6.[IU] via INTRAVENOUS
  Filled 2023-04-09 (×2): qty 50

## 2023-04-09 MED ORDER — HYDROMORPHONE HCL 1 MG/ML IJ SOLN
INTRAMUSCULAR | Status: AC
  Filled 2023-04-09: qty 0.5

## 2023-04-09 MED ORDER — DIPHENHYDRAMINE HCL 50 MG/ML IJ SOLN
12.5000 mg | INTRAMUSCULAR | Status: DC | PRN
  Administered 2023-04-09: 12.5 mg via INTRAVENOUS
  Filled 2023-04-09: qty 1

## 2023-04-09 MED ORDER — IBUPROFEN 600 MG PO TABS
600.0000 mg | ORAL_TABLET | Freq: Four times a day (QID) | ORAL | Status: AC
  Administered 2023-04-09 – 2023-04-12 (×12): 600 mg via ORAL
  Filled 2023-04-09 (×12): qty 1

## 2023-04-09 MED ORDER — FENTANYL CITRATE (PF) 100 MCG/2ML IJ SOLN
INTRAMUSCULAR | Status: AC
  Filled 2023-04-09: qty 2

## 2023-04-09 MED ORDER — LABETALOL HCL 5 MG/ML IV SOLN: 80.0000 mg | INTRAVENOUS | Status: DC | PRN

## 2023-04-09 MED ORDER — SCOPOLAMINE 1 MG/3DAYS TD PT72
MEDICATED_PATCH | TRANSDERMAL | Status: DC | PRN
  Administered 2023-04-09: 1 via TRANSDERMAL

## 2023-04-09 MED ORDER — PHENYLEPHRINE 80 MCG/ML (10ML) SYRINGE FOR IV PUSH (FOR BLOOD PRESSURE SUPPORT)
PREFILLED_SYRINGE | INTRAVENOUS | Status: AC
  Filled 2023-04-09: qty 10

## 2023-04-09 MED ORDER — COCONUT OIL OIL
1.0000 | TOPICAL_OIL | Status: DC | PRN
  Administered 2023-04-10: 1 via TOPICAL

## 2023-04-09 MED ORDER — FENTANYL CITRATE (PF) 100 MCG/2ML IJ SOLN
50.0000 ug | INTRAMUSCULAR | Status: DC | PRN
  Administered 2023-04-09: 50 ug via INTRAVENOUS
  Administered 2023-04-09 (×2): 100 ug via INTRAVENOUS
  Filled 2023-04-09 (×3): qty 2

## 2023-04-09 MED ORDER — AMISULPRIDE (ANTIEMETIC) 5 MG/2ML IV SOLN: 10.0000 mg | Freq: Once | INTRAVENOUS | Status: DC | PRN

## 2023-04-09 MED ORDER — PHENYLEPHRINE HCL-NACL 20-0.9 MG/250ML-% IV SOLN
INTRAVENOUS | Status: AC
  Filled 2023-04-09: qty 250

## 2023-04-09 MED ORDER — CEFAZOLIN SODIUM-DEXTROSE 2-4 GM/100ML-% IV SOLN
2.0000 g | Freq: Once | INTRAVENOUS | Status: AC
  Administered 2023-04-10: 2 g via INTRAVENOUS
  Filled 2023-04-09: qty 100

## 2023-04-09 MED ORDER — MAGNESIUM SULFATE 40 GM/1000ML IV SOLN
2.0000 g/h | INTRAVENOUS | Status: AC
  Administered 2023-04-09 – 2023-04-10 (×2): 2 g/h via INTRAVENOUS
  Filled 2023-04-09: qty 1000

## 2023-04-09 MED ORDER — METOCLOPRAMIDE HCL 5 MG/ML IJ SOLN
INTRAMUSCULAR | Status: AC
  Filled 2023-04-09: qty 2

## 2023-04-09 MED ORDER — PHENYLEPHRINE HCL-NACL 20-0.9 MG/250ML-% IV SOLN
INTRAVENOUS | Status: DC | PRN
  Administered 2023-04-09: 60 ug/min via INTRAVENOUS

## 2023-04-09 MED ORDER — DIPHENHYDRAMINE HCL 25 MG PO CAPS: 25.0000 mg | ORAL_CAPSULE | Freq: Four times a day (QID) | ORAL | Status: DC | PRN

## 2023-04-09 MED ORDER — ONDANSETRON HCL 4 MG/2ML IJ SOLN: 4.0000 mg | Freq: Once | INTRAMUSCULAR | Status: DC | PRN

## 2023-04-09 MED ORDER — LACTATED RINGERS IV SOLN: INTRAVENOUS | Status: AC

## 2023-04-09 MED ORDER — ACETAMINOPHEN 325 MG PO TABS
650.0000 mg | ORAL_TABLET | ORAL | Status: DC | PRN
  Administered 2023-04-10 – 2023-04-13 (×4): 650 mg via ORAL
  Filled 2023-04-09 (×4): qty 2

## 2023-04-09 MED ORDER — MORPHINE SULFATE (PF) 0.5 MG/ML IJ SOLN
INTRAMUSCULAR | Status: AC
  Filled 2023-04-09: qty 10

## 2023-04-09 MED ORDER — SIMETHICONE 80 MG PO CHEW
80.0000 mg | CHEWABLE_TABLET | Freq: Three times a day (TID) | ORAL | Status: DC
  Administered 2023-04-10 – 2023-04-13 (×10): 80 mg via ORAL
  Filled 2023-04-09 (×10): qty 1

## 2023-04-09 MED ORDER — SODIUM CHLORIDE 0.9 % IR SOLN
Status: DC | PRN
  Administered 2023-04-09: 1

## 2023-04-09 MED ORDER — LACTATED RINGERS IV SOLN: 500.0000 mL | Freq: Once | INTRAVENOUS | Status: DC

## 2023-04-09 MED ORDER — ZOLPIDEM TARTRATE 5 MG PO TABS: 5.0000 mg | ORAL_TABLET | Freq: Every evening | ORAL | Status: DC | PRN

## 2023-04-09 MED ORDER — MISOPROSTOL 25 MCG QUARTER TABLET
25.0000 ug | ORAL_TABLET | ORAL | Status: DC | PRN
  Administered 2023-04-09 (×3): 25 ug via VAGINAL
  Filled 2023-04-09 (×4): qty 1

## 2023-04-09 MED ORDER — PRENATAL MULTIVITAMIN CH
1.0000 | ORAL_TABLET | Freq: Every day | ORAL | Status: DC
  Administered 2023-04-10 – 2023-04-12 (×3): 1 via ORAL
  Filled 2023-04-09 (×3): qty 1

## 2023-04-09 MED ORDER — ONDANSETRON HCL 4 MG/2ML IJ SOLN
INTRAMUSCULAR | Status: DC | PRN
  Administered 2023-04-09: 4 mg via INTRAVENOUS

## 2023-04-09 MED ORDER — OXYCODONE HCL 5 MG PO TABS
5.0000 mg | ORAL_TABLET | ORAL | Status: DC | PRN
  Administered 2023-04-11: 5 mg via ORAL
  Filled 2023-04-09: qty 1

## 2023-04-09 MED ORDER — ACETAMINOPHEN 500 MG PO TABS
1000.0000 mg | ORAL_TABLET | Freq: Four times a day (QID) | ORAL | Status: AC
  Administered 2023-04-09 – 2023-04-10 (×4): 1000 mg via ORAL
  Filled 2023-04-09 (×4): qty 2

## 2023-04-09 MED ORDER — ACETAMINOPHEN 325 MG PO TABS
650.0000 mg | ORAL_TABLET | ORAL | Status: DC | PRN
  Administered 2023-04-09: 650 mg via ORAL
  Filled 2023-04-09: qty 2

## 2023-04-09 MED ORDER — NALOXONE HCL 4 MG/10ML IJ SOLN: 1.0000 ug/kg/h | INTRAVENOUS | Status: DC | PRN

## 2023-04-09 MED ORDER — ONDANSETRON HCL 4 MG/2ML IJ SOLN
4.0000 mg | Freq: Four times a day (QID) | INTRAMUSCULAR | Status: DC | PRN
  Administered 2023-04-09: 4 mg via INTRAVENOUS
  Filled 2023-04-09: qty 2

## 2023-04-09 MED ORDER — CEFAZOLIN SODIUM-DEXTROSE 2-3 GM-%(50ML) IV SOLR
INTRAVENOUS | Status: DC | PRN
  Administered 2023-04-09: 2 g via INTRAVENOUS

## 2023-04-09 MED ORDER — ONDANSETRON HCL 4 MG/2ML IJ SOLN
INTRAMUSCULAR | Status: AC
  Filled 2023-04-09: qty 2

## 2023-04-09 MED ORDER — WITCH HAZEL-GLYCERIN EX PADS: 1.0000 | MEDICATED_PAD | CUTANEOUS | Status: DC | PRN

## 2023-04-09 MED ORDER — NALOXONE HCL 0.4 MG/ML IJ SOLN: 0.4000 mg | INTRAMUSCULAR | Status: DC | PRN

## 2023-04-09 MED ORDER — OXYCODONE HCL 5 MG PO TABS: 5.0000 mg | ORAL_TABLET | Freq: Once | ORAL | Status: DC | PRN

## 2023-04-09 MED ORDER — LABETALOL HCL 5 MG/ML IV SOLN: 40.0000 mg | INTRAVENOUS | Status: DC | PRN

## 2023-04-09 MED ORDER — METOCLOPRAMIDE HCL 5 MG/ML IJ SOLN
INTRAMUSCULAR | Status: DC | PRN
  Administered 2023-04-09: 10 mg via INTRAVENOUS

## 2023-04-09 MED ORDER — OXYCODONE-ACETAMINOPHEN 5-325 MG PO TABS: 1.0000 | ORAL_TABLET | ORAL | Status: DC | PRN

## 2023-04-09 SURGICAL SUPPLY — 32 items
APL PRP STRL LF DISP 70% ISPRP (MISCELLANEOUS) ×2
APL SKNCLS STERI-STRIP NONHPOA (GAUZE/BANDAGES/DRESSINGS) ×1
BENZOIN TINCTURE PRP APPL 2/3 (GAUZE/BANDAGES/DRESSINGS) IMPLANT
CHLORAPREP W/TINT 26 (MISCELLANEOUS) ×2 IMPLANT
CLAMP UMBILICAL CORD (MISCELLANEOUS) ×1 IMPLANT
CLOTH BEACON ORANGE TIMEOUT ST (SAFETY) ×1 IMPLANT
DRSG OPSITE POSTOP 4X10 (GAUZE/BANDAGES/DRESSINGS) ×1 IMPLANT
ELECT REM PT RETURN 9FT ADLT (ELECTROSURGICAL) ×1
ELECTRODE REM PT RTRN 9FT ADLT (ELECTROSURGICAL) ×1 IMPLANT
EXTRACTOR VACUUM M CUP 4 TUBE (SUCTIONS) IMPLANT
GLOVE BIOGEL PI IND STRL 7.0 (GLOVE) ×2 IMPLANT
GLOVE BIOGEL PI IND STRL 7.5 (GLOVE) ×1 IMPLANT
GLOVE ECLIPSE 7.0 STRL STRAW (GLOVE) ×1 IMPLANT
GOWN STRL REUS W/TWL LRG LVL3 (GOWN DISPOSABLE) ×2 IMPLANT
HEMOSTAT ARISTA ABSORB 3G PWDR (HEMOSTASIS) IMPLANT
KIT ABG SYR 3ML LUER SLIP (SYRINGE) IMPLANT
MAT PREVALON FULL STRYKER (MISCELLANEOUS) IMPLANT
NDL HYPO 25X5/8 SAFETYGLIDE (NEEDLE) IMPLANT
NEEDLE HYPO 25X5/8 SAFETYGLIDE (NEEDLE) IMPLANT
NS IRRIG 1000ML POUR BTL (IV SOLUTION) ×1 IMPLANT
PACK C SECTION WH (CUSTOM PROCEDURE TRAY) ×1 IMPLANT
PAD OB MATERNITY 4.3X12.25 (PERSONAL CARE ITEMS) ×1 IMPLANT
STRIP CLOSURE SKIN 1/2X4 (GAUZE/BANDAGES/DRESSINGS) IMPLANT
SUT PDS AB 0 CTX 60 (SUTURE) ×2 IMPLANT
SUT PLAIN 0 NONE (SUTURE) IMPLANT
SUT VIC AB 0 CT1 36 (SUTURE) IMPLANT
SUT VIC AB 0 CTX 36 (SUTURE) ×3
SUT VIC AB 0 CTX36XBRD ANBCTRL (SUTURE) ×3 IMPLANT
SUT VIC AB 4-0 KS 27 (SUTURE) ×1 IMPLANT
TOWEL OR 17X24 6PK STRL BLUE (TOWEL DISPOSABLE) ×1 IMPLANT
TRAY FOLEY W/BAG SLVR 14FR LF (SET/KITS/TRAYS/PACK) ×1 IMPLANT
WATER STERILE IRR 1000ML POUR (IV SOLUTION) ×1 IMPLANT

## 2023-04-09 NOTE — Op Note (Signed)
Operative Note  PROCEDURE DATE: 04/09/23   PREOPERATIVE DIAGNOSIS: chronic hypertension with superimposed preeclampsia with severe features.  Fetal intolerance to labor.   POSTOPERATIVE DIAGNOSIS: The same   PROCEDURE:    Urgent Primary Low Transverse Cesarean Section   SURGEON:  Dr. Nilda Simmer ASSISTANT: Dr. Clance Boll  An experienced assistant was required given the standard of surgical care given the complexity of the case.  This assistant was needed for exposure, dissection, suctioning, retraction, instrument exchange, assisting with delivery with administration of fundal pressure, and for overall help during the procedure.    INDICATIONS: This is a 27 y.o. yo G1 at [redacted]w[redacted]d requiring cesarean section secondary to fetal intolerance of labor. She was undergoing induction for cHTN with SIPE with new onset severe features of severe range BP. She was on maximum doses of oral antihypertensives. Her induction was complicated by new onset repetitive late decelerations that did not resolve with position changes or fluids. Minimal variability was also noted.  Decision made to proceed with urgent LTCS. The risks of cesarean section discussed with the patient included but were not limited to: bleeding which may require transfusion or reoperation; infection which may require antibiotics; injury to bowel, bladder, ureters or other surrounding organs; injury to the fetus; need for additional procedures including hysterectomy in the event of a life-threatening hemorrhage; placental abnormalities wth subsequent pregnancies, incisional problems, thromboembolic phenomenon and other postoperative/anesthesia complications. The patient agreed with the proposed plan, giving informed consent for the procedure.     FINDINGS:  Viable female infant in vertex presentation, APGARs 8, 9,  Weight pending, Amniotic fluid clear,  Intact placenta, three vessel cord.  Grossly normal uterus  .   ANESTHESIA:     Epidural ESTIMATED BLOOD LOSS: 213 cc SPECIMENS: Placenta for path (preeclampsia) COMPLICATIONS: None immediate    PROCEDURE IN DETAIL:  The patient received intravenous antibiotics (2g Ancef and azithromycin) and had sequential compression devices applied to her lower extremities while in the preoperative area.  She was then taken to the operating room where spinal anesthesia was dosed up to surgical level and was found to be adequate. She was then placed in a dorsal supine position with a leftward tilt, and prepped and draped in a sterile manner. Prior to prep, fetal heart tones were not able to be detected.  Decision was made to betadine splash for prep and proceed in stat fashion.  A foley catheter was placed into her bladder and attached to constant gravity.  After an adequate timeout was performed, a Pfannenstiel skin incision was made with scalpel and carried through to the underlying layer of fascia. The fascia was incised in the midline and this incision was extended bilaterally bluntly, as was the rectus and peritoneum.  A transverse hysterotomy was made with a scalpel and extended bilaterally bluntly. The bladder blade was then removed. The infant was successfully delivered, and cord was clamped and cut and infant was handed over to awaiting neonatology team. Uterine massage was then administered and the placenta delivered intact with three-vessel cord. Cord gases collected. The uterus was cleared of clot and debris.  The hysterotomy was closed with 0 vicryl.  A second imbricating suture of 0-vicryl was used to reinforce the incision and aid in hemostasis. Arista was applied. The fascia was closed with 0-Vicryl in a running fashion with good restoration of anatomy.  The subcutaneus tissue was irrigated and was reapproximated using running plain gut.  The skin was closed with 4-0 Vicryl in a subcuticular fashion.  All surgical site and was hemostatic at end of procedure without any further bleeding  on exam.    Pt tolerated the procedure well. All sponge/lap/needle counts were correct  X 2. Pt taken to recovery room in stable condition.     Nilda Simmer MD

## 2023-04-09 NOTE — Anesthesia Postprocedure Evaluation (Signed)
Anesthesia Post Note  Patient: Monique Jackson  Procedure(s) Performed: CESAREAN SECTION     Patient location during evaluation: PACU Anesthesia Type: Spinal Level of consciousness: awake and alert and oriented Pain management: pain level controlled Vital Signs Assessment: post-procedure vital signs reviewed and stable Respiratory status: spontaneous breathing, nonlabored ventilation and respiratory function stable Cardiovascular status: blood pressure returned to baseline and stable Postop Assessment: no headache, no backache, spinal receding and patient able to bend at knees Anesthetic complications: no   No notable events documented.  Last Vitals:  Vitals:   04/09/23 1955 04/09/23 2000  BP:  129/84  Pulse: 79 79  Resp: (!) 23 16  Temp:    SpO2: 95% 93%    Last Pain:  Vitals:   04/09/23 1955  TempSrc:   PainSc: 3    Pain Goal: Patients Stated Pain Goal: 0 (04/09/23 1618)  LLE Motor Response: Purposeful movement (04/09/23 1955) LLE Sensation: Tingling (04/09/23 1955) RLE Motor Response: Purposeful movement (04/09/23 1955) RLE Sensation: Tingling (04/09/23 1955)     Epidural/Spinal Function Cutaneous sensation: Tingles (04/09/23 1955), Patient able to flex knees: Yes (04/09/23 1955), Patient able to lift hips off bed: Yes (04/09/23 1955), Back pain beyond tenderness at insertion site: No (04/09/23 1955), Progressively worsening motor and/or sensory loss: No (04/09/23 1955), Bowel and/or bladder incontinence post epidural: No (04/09/23 1955)  Tennis Must [place]

## 2023-04-09 NOTE — Progress Notes (Signed)
Labor Progress Note  Patient responsive to first cytotec dose,  contractions are getting painful, q2-3 cm.  Will recheck within an hour to determine if additional doses are needed.  FHT with moderate, but occasional minimal variability, likely due to magnesium and she received a dose of fentanyl. Rare subtle late decel over night but none currently.  BP 137/79   Pulse 79   Temp 97.9 F (36.6 C) (Oral)   Resp 18   Ht 5\' 4"  (1.626 m)   Wt 113.9 kg   LMP 07/27/2022 (Exact Date)   SpO2 100%   BMI 43.08 kg/m   Plan reviewed again with patient. BP controlled, BG controlled.   Monique Jackson

## 2023-04-09 NOTE — Progress Notes (Signed)
At bedside for new onset recurrent late decelerations.  Some lasting ~2 minutes.   Variability is also minimal.  Given FHT concerns and remote from delivery, PCS recommended, urgently.  Patient was counseled on the risks of Cesarean delivery, which include but are not limited to bleeding, infection, damage to nearby organs including bowel/bladder/ureter, need for additional procedure or blood transfusion, and implications for future pregnancy.  Patient agreeable to procedure, all questions answered.   OR and anesthesia alerted, will proceed as able.  Nilda Simmer

## 2023-04-09 NOTE — Anesthesia Preprocedure Evaluation (Signed)
Anesthesia Evaluation  Patient identified by MRN, date of birth, ID band Patient awake    Reviewed: Allergy & Precautions, Patient's Chart, lab work & pertinent test results, reviewed documented beta blocker date and time   Airway Mallampati: III  TM Distance: >3 FB Neck ROM: Full    Dental no notable dental hx.    Pulmonary neg pulmonary ROS   Pulmonary exam normal breath sounds clear to auscultation       Cardiovascular hypertension, Pt. on medications and Pt. on home beta blockers Normal cardiovascular exam Rhythm:Regular Rate:Normal     Neuro/Psych  PSYCHIATRIC DISORDERS Anxiety     negative neurological ROS     GI/Hepatic negative GI ROS, Neg liver ROS,,,  Endo/Other  diabetes, Well Controlled, Gestational, Oral Hypoglycemic Agents  Morbid obesityBMI 43  Renal/GU negative Renal ROS  negative genitourinary   Musculoskeletal negative musculoskeletal ROS (+)    Abdominal  (+) + obese  Peds negative pediatric ROS (+)  Hematology negative hematology ROS (+) Hb 12.2, plt 205   Anesthesia Other Findings   Reproductive/Obstetrics (+) Pregnancy preE, NRFHT                             Anesthesia Physical Anesthesia Plan  ASA: 3  Anesthesia Plan: Spinal   Post-op Pain Management: Regional block, Toradol IV (intra-op)* and Ofirmev IV (intra-op)*   Induction:   PONV Risk Score and Plan: 3 and Ondansetron, Dexamethasone and Treatment may vary due to age or medical condition  Airway Management Planned: Natural Airway and Nasal Cannula  Additional Equipment: None  Intra-op Plan:   Post-operative Plan:   Informed Consent: I have reviewed the patients History and Physical, chart, labs and discussed the procedure including the risks, benefits and alternatives for the proposed anesthesia with the patient or authorized representative who has indicated his/her understanding and acceptance.        Plan Discussed with: CRNA  Anesthesia Plan Comments:        Anesthesia Quick Evaluation

## 2023-04-09 NOTE — Plan of Care (Signed)
  Problem: Health Behavior/Discharge Planning: Goal: Ability to manage health-related needs will improve Outcome: Progressing   Problem: Clinical Measurements: Goal: Ability to maintain clinical measurements within normal limits will improve Outcome: Progressing Goal: Will remain free from infection Outcome: Progressing Goal: Diagnostic test results will improve Outcome: Progressing   Problem: Safety: Goal: Ability to remain free from injury will improve Outcome: Progressing   Problem: Fluid Volume: Goal: Peripheral tissue perfusion will improve Outcome: Progressing   Problem: Clinical Measurements: Goal: Complications related to disease process, condition or treatment will be avoided or minimized Outcome: Progressing   Problem: Education: Goal: Knowledge of Childbirth will improve Outcome: Progressing Goal: Ability to make informed decisions regarding treatment and plan of care will improve Outcome: Progressing Goal: Ability to state and carry out methods to decrease the pain will improve Outcome: Progressing Goal: Individualized Educational Video(s) Outcome: Progressing   Problem: Coping: Goal: Ability to verbalize concerns and feelings about labor and delivery will improve Outcome: Progressing   Problem: Life Cycle: Goal: Ability to make normal progression through stages of labor will improve Outcome: Progressing Goal: Ability to effectively push during vaginal delivery will improve Outcome: Progressing   Problem: Role Relationship: Goal: Will demonstrate positive interactions with the child Outcome: Progressing   Problem: Safety: Goal: Risk of complications during labor and delivery will decrease Outcome: Progressing   Problem: Pain Management: Goal: Relief or control of pain from uterine contractions will improve Outcome: Progressing   Problem: Education: Goal: Knowledge of the prescribed therapeutic regimen will improve Outcome: Progressing Goal:  Understanding of sexual limitations or changes related to disease process or condition will improve Outcome: Progressing Goal: Individualized Educational Video(s) Outcome: Progressing   Problem: Self-Concept: Goal: Communication of feelings regarding changes in body function or appearance will improve Outcome: Progressing   Problem: Skin Integrity: Goal: Demonstration of wound healing without infection will improve Outcome: Progressing   Problem: Education: Goal: Knowledge of condition will improve Outcome: Progressing Goal: Individualized Educational Video(s) Outcome: Progressing Goal: Individualized Newborn Educational Video(s) Outcome: Progressing   Problem: Activity: Goal: Will verbalize the importance of balancing activity with adequate rest periods Outcome: Progressing Goal: Ability to tolerate increased activity will improve Outcome: Progressing   Problem: Coping: Goal: Ability to identify and utilize available resources and services will improve Outcome: Progressing   Problem: Life Cycle: Goal: Chance of risk for complications during the postpartum period will decrease Outcome: Progressing   Problem: Role Relationship: Goal: Ability to demonstrate positive interaction with newborn will improve Outcome: Progressing   Problem: Skin Integrity: Goal: Demonstration of wound healing without infection will improve Outcome: Progressing

## 2023-04-09 NOTE — Transfer of Care (Signed)
Immediate Anesthesia Transfer of Care Note  Patient: Monique Jackson  Procedure(s) Performed: CESAREAN SECTION  Patient Location: PACU  Anesthesia Type:Spinal  Level of Consciousness: awake, alert , and oriented  Airway & Oxygen Therapy: Patient Spontanous Breathing  Post-op Assessment: Report given to RN and Post -op Vital signs reviewed and stable  Post vital signs: Reviewed and stable  Last Vitals:  Vitals Value Taken Time  BP 111/65 04/09/23 1845  Temp    Pulse 78 04/09/23 1849  Resp 15 04/09/23 1849  SpO2 95 % 04/09/23 1849  Vitals shown include unfiled device data.  Last Pain:  Vitals:   04/09/23 1650  TempSrc:   PainSc: 0-No pain      Patients Stated Pain Goal: 0 (04/09/23 1618)  Complications: No notable events documented.

## 2023-04-09 NOTE — Progress Notes (Signed)
This nurse educated patient on how to use DBL pump, the frequency and duration for pumping, and how to properly clean parts once done pumping. This nurse also educated patient on how to massage the tissue around her breast to help with the milk let down process. Patient verbalized understanding of the information provided to her by this nurse.

## 2023-04-09 NOTE — Anesthesia Procedure Notes (Signed)
Spinal  Patient location during procedure: OR Start time: 04/09/2023 5:19 PM End time: 04/09/2023 5:23 PM Reason for block: surgical anesthesia Staffing Performed: anesthesiologist  Anesthesiologist: Lannie Fields, DO Performed by: Lannie Fields, DO Authorized by: Lannie Fields, DO   Preanesthetic Checklist Completed: patient identified, IV checked, risks and benefits discussed, surgical consent, monitors and equipment checked, pre-op evaluation and timeout performed Spinal Block Patient position: sitting Prep: DuraPrep and site prepped and draped Patient monitoring: cardiac monitor, continuous pulse ox and blood pressure Approach: midline Location: L3-4 Injection technique: single-shot Needle Needle type: Pencan  Needle gauge: 24 G Needle length: 9 cm Assessment Sensory level: T6 Events: CSF return Additional Notes Functioning IV was confirmed and monitors were applied. Sterile prep and drape, including hand hygiene and sterile gloves were used. The patient was positioned and the spine was prepped. The skin was anesthetized with lidocaine.  Free flow of clear CSF was obtained prior to injecting local anesthetic into the CSF.  The spinal needle aspirated freely following injection.  The needle was carefully withdrawn.  The patient tolerated the procedure well.

## 2023-04-10 ENCOUNTER — Encounter (HOSPITAL_COMMUNITY): Payer: Self-pay | Admitting: Obstetrics and Gynecology

## 2023-04-10 LAB — COMPREHENSIVE METABOLIC PANEL WITH GFR
ALT: 24 U/L (ref 0–44)
AST: 22 U/L (ref 15–41)
Albumin: 2.3 g/dL — ABNORMAL LOW (ref 3.5–5.0)
Alkaline Phosphatase: 83 U/L (ref 38–126)
Anion gap: 15 (ref 5–15)
BUN: 10 mg/dL (ref 6–20)
CO2: 20 mmol/L — ABNORMAL LOW (ref 22–32)
Calcium: 7.5 mg/dL — ABNORMAL LOW (ref 8.9–10.3)
Chloride: 92 mmol/L — ABNORMAL LOW (ref 98–111)
Creatinine, Ser: 0.96 mg/dL (ref 0.44–1.00)
GFR, Estimated: >60 mL/min (ref >60)
Glucose, Bld: 223 mg/dL — ABNORMAL HIGH (ref 70–99)
Potassium: 4.4 mmol/L (ref 3.5–5.1)
Sodium: 127 mmol/L — ABNORMAL LOW (ref 135–145)
Total Bilirubin: 0.4 mg/dL (ref 0.3–1.2)
Total Protein: 5.2 g/dL — ABNORMAL LOW (ref 6.5–8.1)

## 2023-04-10 LAB — CBC
HCT: 30.8 % — ABNORMAL LOW (ref 36.0–46.0)
Hemoglobin: 10.5 g/dL — ABNORMAL LOW (ref 12.0–15.0)
MCH: 29.4 pg (ref 26.0–34.0)
MCHC: 34.1 g/dL (ref 30.0–36.0)
MCV: 86.3 fL (ref 80.0–100.0)
Platelets: 181 10*3/uL (ref 150–400)
RBC: 3.57 MIL/uL — ABNORMAL LOW (ref 3.87–5.11)
RDW: 14.5 % (ref 11.5–15.5)
WBC: 13.7 10*3/uL — ABNORMAL HIGH (ref 4.0–10.5)
nRBC: 0 % (ref 0.0–0.2)

## 2023-04-10 LAB — MAGNESIUM: Magnesium: 5.4 mg/dL — ABNORMAL HIGH (ref 1.7–2.4)

## 2023-04-10 LAB — GLUCOSE, CAPILLARY
Glucose-Capillary: 135 mg/dL — ABNORMAL HIGH (ref 70–99)
Glucose-Capillary: 120 mg/dL — ABNORMAL HIGH (ref 70–99)

## 2023-04-10 MED ORDER — METFORMIN HCL 500 MG PO TABS
500.0000 mg | ORAL_TABLET | Freq: Every day | ORAL | Status: DC
  Administered 2023-04-10 – 2023-04-13 (×4): 500 mg via ORAL
  Filled 2023-04-10 (×5): qty 1

## 2023-04-10 MED ORDER — INSULIN ASPART 100 UNIT/ML IJ SOLN
0.0000 [IU] | Freq: Three times a day (TID) | INTRAMUSCULAR | Status: DC
  Administered 2023-04-10 – 2023-04-11 (×2): 1 [IU] via SUBCUTANEOUS

## 2023-04-10 NOTE — Progress Notes (Signed)
Postpartum Progress Note  Postpartum Day 1 s/p primary Cesarean section for chronic HTN with SIPE and severe features.   Subjective:  Patient reports no overnight events.  She reports well controlled pain, ambulating without difficulty, voiding spontaneously, tolerating PO.  She reports Positive flatus, negative BM.  Vaginal bleeding is appropriate.  Objective: Blood pressure 131/77, pulse 77, temperature 98.4 F (36.9 C), temperature source Oral, resp. rate 18, height 5\' 4"  (1.626 m), weight 113.9 kg, last menstrual period 07/27/2022, SpO2 96%, unknown if currently breastfeeding.  Physical Exam:  General: alert and no distress Lochia: appropriate Abdomen: soft, ATTP Uterine Fundus: firm Incision: dressing in place DVT Evaluation: No evidence of DVT seen on physical exam.  Recent Labs    04/09/23 0532 04/10/23 0418  HGB 12.2 10.5*  HCT 37.0 30.8*    Assessment/Plan: Postpartum Day 1, s/p C-section cHTN with superimposed preeclampsia with severe features Antihypertensives: labetalol 800 mg TID, Procardia 60 PO BID BP normal range and mild range elevated. Will consider titrating down. Postpartum magnesium for 24 hrs.  Cr 0.96, Mag level is therapeutic at 5.4 A2GDM - glucose on CMP this AM 223. Will continue to check, SSI and likely continue metformin. Baby boy transferred to NICU from nursery yesterday for low temps and intermittent desaturations Dispo: Continue postpartum magnesium today, may consider BP med titration.  Continue postpartum care.   LOS: 15 days   Lyn Henri 04/10/2023, 7:56 AM

## 2023-04-10 NOTE — Lactation Note (Signed)
This note was copied from a baby's chart.  NICU Lactation Consultation Note  Patient Name: Monique Jackson [id number] Date: 04/10/2023 Age:28 hours  Reason for consult: Initial assessment; NICU baby; Infant < 6lbs; Maternal endocrine disorder; Primapara; 1st time breastfeeding Type of Endocrine Disorder?: Diabetes (A2DM (metofrmin))  SUBJECTIVE Visited with family of 1 hours old LPI NICU female twice; Monique Jackson is a P1 and reports she started pumping last night, praised her for her efforts. Her plan is to do both, direct breastfeeding along with pumping and bottle feeding with EBM/formula for baby "Monique Jackson". She voiced that "nothing came out". Explained that the purpose of pumping this early on is mainly for breast stimulation and not to get volumes, she voiced understanding. Assisted with breast massage, hand expression and provided a hands free pumping top in size XL "Green". Reviewed pumping schedule, pump settings lactogenesis II, and anticipatory guidelines.  OBJECTIVE Infant data: Mother's Current Feeding Choice: Breast Milk and Formula  Infant feeding assessment Scale for Readiness: 2 Scale for Quality: 2   Maternal data: [id number] C-Section, Low Transverse Has patient been taught Hand Expression?: Yes Hand Expression Comments: no colostrum noted yet Significant Breast History:: moderate breast changes Current breast feeding challenges:: NICU admission Does the patient have breastfeeding experience prior to this delivery?: No Pumping frequency: initiated pumping at 5 hours post-partum Pumped volume: 0 mL (droplet) Flange Size: 21; 24 Risk factor for low milk supply:: primipara, prematurity, A2DM, infant separation  Pump: Hands Free, Manual, Personal (DEBP at home)  ASSESSMENT Infant: Feeding Status: Ad lib  Maternal: Breasts are soft and tissue is compressible  INTERVENTIONS/PLAN Interventions: Interventions: Breast massage; Hand express; Breast feeding basics  reviewed; Coconut oil; DEBP; Education; [employer] brochure Tools: Pump; Flanges; Coconut oil; Hands-free pumping top (Size XL "Green") Pump Education: Setup, frequency, and cleaning; Milk Storage  Plan: Encouraged pumping every 3 hours, ideally 8 pumping sessions/24 hours Breast massage, hand expression and coconut oil were also encouraged prior pumping She'll call for assistance when she's ready to take baby to breast  Visitors present. All questions and concerns answered, family to contact [facility] services PRN.  Consult Status: NICU follow-up NICU Follow-up type: New admission follow up    S Philis Nettle 04/10/2023, 11:50 AM

## 2023-04-11 LAB — GLUCOSE, CAPILLARY
Glucose-Capillary: 78 mg/dL (ref 70–99)
Glucose-Capillary: 89 mg/dL (ref 70–99)
Glucose-Capillary: 146 mg/dL — ABNORMAL HIGH (ref 70–99)
Glucose-Capillary: 118 mg/dL — ABNORMAL HIGH (ref 70–99)

## 2023-04-11 MED ORDER — MUPIROCIN 2 % EX OINT
TOPICAL_OINTMENT | Freq: Two times a day (BID) | CUTANEOUS | Status: DC
  Filled 2023-04-11: qty 22

## 2023-04-11 NOTE — Plan of Care (Signed)
  Problem: Health Behavior/Discharge Planning: Goal: Ability to manage health-related needs will improve Outcome: Progressing   Problem: Clinical Measurements: Goal: Ability to maintain clinical measurements within normal limits will improve Outcome: Progressing Goal: Will remain free from infection Outcome: Progressing Goal: Diagnostic test results will improve Outcome: Progressing   Problem: Safety: Goal: Ability to remain free from injury will improve Outcome: Progressing   Problem: Clinical Measurements: Goal: Complications related to disease process, condition or treatment will be avoided or minimized Outcome: Progressing

## 2023-04-11 NOTE — Lactation Note (Signed)
This note was copied from a baby's chart. Lactation Consultation Note  Patient Name: Monique Jackson [id number] Date: 04/11/2023 Age:28 hours Reason for consult: Follow-up assessment;Primapara;1st time breastfeeding;Late-preterm 34-36.6wks;Infant < 6lbs;Infant weight loss;Maternal endocrine disorder  LC in to visit with P1 Mom of LPTI baby delivered by C/Section and weighing 4 lbs 13.3 oz at birth.  Baby transitioned from NICU to rooming-in with Mom.  Baby is down 6.8% from birth weight and being supplemented with 22 cal formula by Nfant Slow flow nipple and taking volumes of 20-24 ml.  LC placed a crib card with volume parameters to meet each day, which baby is consuming more.  Mom is feeding baby with cues.  LC encouraged her to not let baby go longer than 3 hrs between feeds.  Mom denies any stress cues or leaking of milk with nipple flow rate.  LC reviewed paced bottle feeding, which Mom had instinctively been doing, stopping and burping frequently.  Currently, Mom has baby in her pumping top against her chest.  Baby dressed so not STS.  Talked about the benefit of STS with regards to her milk supply and baby's wellbeing.    Assisted Mom to pump while holding baby.  Mom has been expressing drops, which is WNL. Smaller pumping top provided per Mom's request (brown-L)  Plan recommended- 1- STS with baby 2- Offer the breast with cues, lick and learn after hand expressing 3- Feed baby per volume guidelines, a paced bottle of EBM+/formula 4- Pump both breasts 15 min, adding breast massage and hand expression to increase milk removal. 5- ask for help prn  Lactation Tools Discussed/Used Tools: Pump;Flanges;Hands-free pumping top;Bottle Flange Size: 21 Breast pump type: Double-Electric Breast Pump Pump Education: Setup, frequency, and cleaning;Milk Storage Reason for Pumping: support milk supply Pumping frequency: 8 times per 24 hrs Pumped volume: 0 mL (drops)  Interventions Interventions:  Skin to skin;Breast massage;Hand express;Education;Pace feeding;DEBP;Hand pump  Discharge Pump: Hands Free;DEBP;Personal (Lansinoh hands free pump and a DEBP at home (unsure of brand of pump))  Consult Status Consult Status: Follow-up Date: 04/12/23 Follow-up type: In-patient    Monique Jackson 04/11/2023, 9:30 AM

## 2023-04-11 NOTE — Progress Notes (Signed)
CSW received consult for hx of Anxiety/ Depression and Edinburgh Score of 10.  CSW met with MOB to offer support and complete assessment.    When CSW arrived, MOB was resting in bed bonding with infant as evidence by holding infant and engaging in infant massages; MOB and infant appeared happy and comfortable.  CSW introduced self and explained a need to assess MOB.  MOB appeared happy, forthcoming, and receptive to meeting with CSW.    CSW asked about MOB's MH hx. MOB shared she was dx with anxiety in 2018 however she has never received an official dx of depression. 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  MOB openly shared she meets with a therapist regularly  Laurell Josephs) and plans to make an appointment post discharge. MOB attributed her anxiety around her over thinking all the responsibilities that she has. CSW reviewed MOB's Edinburgh results. CSW provided education regarding the baby blues period vs. perinatal mood disorders, discussed treatment and gave resources for mental health follow up if concerns arise.  CSW recommends self-evaluation during the postpartum time period using the New Mom Checklist from Postpartum Progress and encouraged MOB to contact a medical professional if symptoms are noted at any time.  MOB presented with insight and awareness and denied having any acute MH symptoms.  MOB shared having a good support team that consists of her Best Friends, her extended family, and her immediate family.  Per MOB she feels comfortable seeking help if needed. CSW assessed for safety and MOB denied SI, HI, an DV.   CSW provided MOB with a local pediatrician list.    CSW identifies no further need for intervention and no barriers to discharge at this time.   Blaine Hamper, MSW, LCSW Clinical Social Work [phone]

## 2023-04-11 NOTE — Progress Notes (Signed)
Subjective: Postpartum Day 2: Cesarean Delivery Patient reports tolerating PO, + flatus, + BM, and no problems voiding.   Ambulating Baby back in room with mom Objective: Vital signs in last 24 hours: Temp:  [97.6 F (36.4 C)-98.3 F (36.8 C)] 98.2 F (36.8 C) (08/05 0741) Pulse Rate:  [79-85] 79 (08/05 0741) Resp:  [16-19] 18 (08/05 0741) BP: (117-146)/(62-80) 117/67 (08/05 0741) SpO2:  [99 %] 99 % (08/04 1543)  Physical Exam:  General: alert, cooperative, and no distress Lochia: appropriate Uterine Fundus: firm Incision: healing well DVT Evaluation: No evidence of DVT seen on physical exam.  Recent Labs    04/09/23 0532 04/10/23 0418  HGB 12.2 10.5*  HCT 37.0 30.8*    Assessment/Plan: Postpartum Day 2, s/p C-section cHTN with superimposed preeclampsia with severe features Antihypertensives: labetalol 800 mg TID, Procardia 60 PO BID BP normal range and mild range elevated. Will consider titrating down. Postpartum magnesium now complete, she feels better now A2GDM - glucose on CMP this AM 223. Will continue to check, SSI and likely continue metformin. Baby boy transferred to NICU from nursery yesterday for low temps and intermittent desaturations>now back with mom Dispo:  Continue postpartum care.     Roselle Locus II, MD 04/11/2023, 9:26 AM

## 2023-04-12 LAB — GLUCOSE, CAPILLARY
Glucose-Capillary: 100 mg/dL — ABNORMAL HIGH (ref 70–99)
Glucose-Capillary: 85 mg/dL (ref 70–99)
Glucose-Capillary: 99 mg/dL (ref 70–99)
Glucose-Capillary: 152 mg/dL — ABNORMAL HIGH (ref 70–99)

## 2023-04-12 LAB — SURGICAL PATHOLOGY

## 2023-04-12 MED ORDER — LABETALOL HCL 200 MG PO TABS: 800.0000 mg | ORAL_TABLET | Freq: Three times a day (TID) | ORAL | 0 refills | Status: DC

## 2023-04-12 MED ORDER — IBUPROFEN 600 MG PO TABS: 600.0000 mg | ORAL_TABLET | Freq: Four times a day (QID) | ORAL | 0 refills | Status: DC

## 2023-04-12 MED ORDER — NIFEDIPINE ER 60 MG PO TB24: 60.0000 mg | ORAL_TABLET | Freq: Two times a day (BID) | ORAL | 0 refills | Status: DC

## 2023-04-12 MED ORDER — METFORMIN HCL 500 MG PO TABS: 500.0000 mg | ORAL_TABLET | Freq: Every day | ORAL | 0 refills | Status: DC

## 2023-04-12 MED ORDER — OXYCODONE HCL 5 MG PO TABS: 5.0000 mg | ORAL_TABLET | ORAL | 0 refills | Status: DC | PRN

## 2023-04-12 MED ORDER — MUPIROCIN 2 % EX OINT: TOPICAL_OINTMENT | Freq: Two times a day (BID) | CUTANEOUS | 0 refills | Status: DC

## 2023-04-12 MED ORDER — ACETAMINOPHEN 325 MG PO TABS: 650.0000 mg | ORAL_TABLET | ORAL | 0 refills | Status: DC | PRN

## 2023-04-12 NOTE — Discharge Summary (Signed)
Postpartum Discharge Summary  Patient Name: Monique Jackson DOB: 05/04/1996 MRN: [id number]  Date of admission: 03/25/2023 Delivery date:04/09/2023 Delivering provider: Damaris Hippo A Date of discharge: 04/13/2023  Admitting diagnosis: Chronic hypertension [I10] Intrauterine pregnancy: [redacted]w[redacted]d     Secondary diagnosis:  Principal Problem:   Chronic hypertension Active Problems:   Chronic hypertension in pregnancy   [redacted] weeks gestation of pregnancy   Diabetes mellitus affecting pregnancy in third trimester   [redacted] weeks gestation of pregnancy  Additional problems: Super-imposed pre-eclampsia with severe features    Discharge diagnosis: Preterm Pregnancy Delivered, Preeclampsia (severe), and GDM A2                                              Post partum procedures: none Augmentation: AROM and Cytotec Complications: None  Hospital course: Patient was admitted on 03/25/23 for rule out super-imposed pre-eclampsia.  Patient's previously well-controlled CHTN on labetalol 200 BID was no longer managed.  Anti-hypertensives were titrated up to control increasing BPs.  MFM consult was performed and they recommended inpatient management until delivery.  Patient received BMZ.  24 hour urine collected had protein was below reportable range.  A2DM was managed by Diabetes coordinator given elevation in CBGs secondary to BMZ administration.  NICU consult was performed.  Labs remained stable throughout admission.  On HD15, BPs trended to severe range and the decision was made to deliver at [redacted]w[redacted]d for severe features.  The patient was transferred to L&D and magnesium sulfate was started.  She was given VMP for induction.  Secondary to NRFHT, C/S was recommended.  Delivery details are as follows: Membrane Rupture Time/Date: 5:35 PM,04/09/2023  Delivery Method:C-Section, Low Transverse Details of operation can be found in separate operative Note.  Patient had a postpartum course complicated by n/a. Patient  received 24 hours magnesium recovery.  She continued to receive labetalol 800 TID and Procardia 60 BID for adequate control.  She is ambulating, tolerating a regular diet, passing flatus, and urinating well.  Patient is discharged home in stable condition on 04/13/23.      Newborn Data: Birth date:04/09/2023 Birth time:5:35 PM Gender:Female Living status:Living Apgars:8 ,9  Weight:2190 g                               Magnesium Sulfate received: Yes: Seizure prophylaxis BMZ received: Yes Rhophylac:No MMR:No T-DaP:Given prenatally Flu: No Transfusion:No  Physical exam  Vitals:   04/12/23 1557 04/12/23 1950 04/13/23 0101 04/13/23 0345  BP: 130/76 (!) 145/80 117/63 (!) 149/91  Pulse: 84 89 78 85  Resp: 19 18 16 16   Temp: 98.4 F (36.9 C) 98.1 F (36.7 C) 98.6 F (37 C) 98.4 F (36.9 C)  TempSrc: Oral Oral Oral Oral  SpO2: 99%  99% 99%  Weight:      Height:       General: alert, cooperative, and no distress Lochia: appropriate Uterine Fundus: firm Incision: Healing well with no significant drainage, No significant erythema, Dressing is clean, dry, and intact DVT Evaluation: No evidence of DVT seen on physical exam. Negative Homan's sign. No cords or calf tenderness. Labs: Lab Results  Component Value Date   WBC 13.7 (H) 04/10/2023   HGB 10.5 (L) 04/10/2023   HCT 30.8 (L) 04/10/2023   MCV 86.3 04/10/2023   PLT 181 04/10/2023  Latest Ref Rng & Units 04/11/2023    4:57 AM  CMP  Glucose 70 - 99 mg/dL 87   BUN 6 - 20 mg/dL 11   Creatinine 5.28 - 1.00 mg/dL 4.13   Sodium 244 - 010 mmol/L 137   Potassium 3.5 - 5.1 mmol/L 3.8   Chloride 98 - 111 mmol/L 105   CO2 22 - 32 mmol/L 24   Calcium 8.9 - 10.3 mg/dL 8.1   Total Protein 6.5 - 8.1 g/dL 5.5   Total Bilirubin 0.3 - 1.2 mg/dL 0.5   Alkaline Phos 38 - 126 U/L 82   AST 15 - 41 U/L 24   ALT 0 - 44 U/L 26    Edinburgh Score:    04/10/2023    5:46 PM  Edinburgh Postnatal Depression Scale Screening Tool  I have  been able to laugh and see the funny side of things. 0  I have looked forward with enjoyment to things. 0  I have blamed myself unnecessarily when things went wrong. 1  I have been anxious or worried for no good reason. 3  I have felt scared or panicky for no good reason. 2  Things have been getting on top of me. 2  I have been so unhappy that I have had difficulty sleeping. 0  I have felt sad or miserable. 0  I have been so unhappy that I have been crying. 1  The thought of harming myself has occurred to me. 1  Edinburgh Postnatal Depression Scale Total 10     After visit meds:  Allergies as of 04/13/2023   No Known Allergies      Medication List     STOP taking these medications    aspirin EC 81 MG tablet   atorvastatin 20 MG tablet Commonly known as: LIPITOR   hydrOXYzine 50 MG capsule Commonly known as: VISTARIL   metFORMIN 1000 MG (MOD) 24 hr tablet Commonly known as: GLUMETZA Replaced by: metFORMIN 500 MG tablet   metformin 500 MG (OSM) 24 hr tablet Commonly known as: FORTAMET   terconazole 0.4 % vaginal cream Commonly known as: TERAZOL 7       TAKE these medications    acetaminophen 325 MG tablet Commonly known as: TYLENOL Take 2 tablets (650 mg total) by mouth every 4 (four) hours as needed for mild pain (temperature > 101.5.).   clotrimazole-betamethasone cream Commonly known as: LOTRISONE APPLY TO THE AFFECTED AND SURROUNDING AREAS OF THE SKIN TWICE A DAY IN THE MORNING AND EVENING FOR 2 WEEKS   ibuprofen 600 MG tablet Commonly known as: ADVIL Take 1 tablet (600 mg total) by mouth every 6 (six) hours.   labetalol 200 MG tablet Commonly known as: NORMODYNE Take 4 tablets (800 mg total) by mouth 3 (three) times daily. What changed:  how much to take when to take this   metFORMIN 500 MG tablet Commonly known as: GLUCOPHAGE Take 1 tablet (500 mg total) by mouth daily with breakfast. Replaces: metFORMIN 1000 MG (MOD) 24 hr tablet   mometasone  0.1 % cream Commonly known as: ELOCON Apply a small amount to affected area daily.   mupirocin ointment 2 % Commonly known as: BACTROBAN Apply topically 2 (two) times daily.   NIFEdipine 60 MG 24 hr tablet Commonly known as: ADALAT CC Take 1 tablet (60 mg total) by mouth 2 (two) times daily.   oxyCODONE 5 MG immediate release tablet Commonly known as: Oxy IR/ROXICODONE Take 1 tablet (5 mg total) by mouth  every 4 (four) hours as needed for up to 7 days for moderate pain.   oxyCODONE 5 MG immediate release tablet Commonly known as: Oxy IR/ROXICODONE Take 1 tablet (5 mg total) by mouth every 4 (four) hours as needed for severe pain.   prenatal multivitamin Tabs tablet Take 1 tablet by mouth daily at 12 noon.       D/W patient female infant circumcision, risks/benefits reviewed. All questions answered.    Discharge home in stable condition Infant Feeding: Breast and bottle Infant Disposition:home with mother Discharge instruction: per After Visit Summary and Postpartum booklet. Activity: Advance as tolerated. Pelvic rest for 6 weeks.  Diet: routine diet Future Appointments:No future appointments. Follow up Visit: TFriday for BP check and incision check  04/12/2023 Ranae Pila, MD

## 2023-04-12 NOTE — Progress Notes (Addendum)
Subjective: Postpartum Day 3: Cesarean Delivery Patient reports tolerating PO, + flatus, and no problems voiding.  No HA, vision change, RUQ pain, CP/SOB.  Objective: Vital signs in last 24 hours: Temp:  [98.2 F (36.8 C)-98.6 F (37 C)] 98.2 F (36.8 C) (08/06 0817) Pulse Rate:  [74-93] 84 (08/06 0817) Resp:  [17-18] 17 (08/06 0817) BP: (139-159)/(73-81) 142/76 (08/06 0817) SpO2:  [98 %-100 %] 99 % (08/06 0817)  Physical Exam:  General: alert, cooperative, and appears stated age 27: appropriate Uterine Fundus: firm Incision: healing well, no significant drainage, no dehiscence DVT Evaluation: No evidence of DVT seen on physical exam. Negative Homan's sign. No cords or calf tenderness.  Recent Labs    04/10/23 0418  HGB 10.5*  HCT 30.8*   CBG (last 3)  Recent Labs    04/11/23 2052 04/11/23 2308 04/12/23 0548  GLUCAP 146* 118* 100*     Assessment/Plan: Status post Cesarean section. Doing well postoperatively.  SIPE-Continue labetalol 800 TID and Procardia 60 BID.  S/P magnesium recovery.   A2DM-Continue Metformin 500 every day.   Discharge home with standard precautions and return to clinic in 2-3 days for BP check.  Mitchel Honour, DO 04/12/2023, 9:47 AM

## 2023-04-12 NOTE — Progress Notes (Signed)
Newborn will not be discharged.  Patient has decided to remain inpatient for now for lactation support and monitoring of BP and treatment of pain.  D/C order cancelled and plan to d/c home tomorrow.  Mitchel Honour, DO

## 2023-04-12 NOTE — Discharge Instructions (Signed)
Call MD for T>100.4, heavy vaginal bleeding, severe abdominal pain, intractable nausea and/or vomiting, or respiratory distress.  Call office to schedule BP check on Thursday or Friday.  Pelvic rest x 6 weeks.  No driving while taking narcotics.  No heavy lifting.

## 2023-04-12 NOTE — Lactation Note (Addendum)
This note was copied from a baby's chart. Lactation Consultation Note  Patient Name: Monique Jackson [id number] Date: 04/12/2023 Age:28 hours Reason for consult: Follow-up assessment;Primapara;1st time breastfeeding;Late-preterm 34-36.6wks;Infant < 6lbs;Maternal endocrine disorder;Nipple pain/trauma;Breastfeeding assistance  LC in to visit with P1 Mom of baby born at [redacted]w[redacted]d with birth weight of 4lbs 13.3oz.  Baby dropped to 7% yesterday and is now at a 4% weight loss with a weight gain of 54 gms overnight.    Baby getting his bath at present.  Mom states she is now using a faster nipple on the bottle and taking adequate volumes of milk, 30 ml at last feeding.    Baby placed STS on Mom's chest post bath.    Baby's last feeding was 2.5 hrs ago and subtle cues noted.  LC suggested Mom position baby and offer the breast for LC to offer education and guidance.  LC rolled up cloth diaper and placed under left breast for support.  Pillows placed behind Mom's back and beside Mom's breast.  Baby STS with blanket over him.  Baby supporting baby's head from ear to ear and supporting her breast well.  Baby did open a few times and was brought onto breast.  Baby's mouth is small and he latches to nipple base.  2 non-nutritive sucks noted.  LC tried to hand express, unable to express anything at present.  LC noticed baby was jittery.  Dr. Margo Aye arrived in the room as the breastfeeding attempt was over. Baby to have a serum blood sugar.  Talked to Mom about the importance of offering the breast as practice, but not to count it as a feeding at this time due to baby's prematurity and size.  Reassured her that baby would grow and mature and be able to obtain a full feeding at the breast.  The importance of consistent pumping during this time is critical.  Also, Mom is interested in OP lactation F/U and will need a message sent to OP clinic.  Mom hasn't been pumping as frequently due to pain with flanges.  RN at night  provided Mom with 30 mm flanges.  LC assessed and recommended 24 mm on left breast and 27 mm on right.  LC provided Mom with 27 mm flanges.   Plan recommended- 1- STS as much as possible 2-Offer the breast with cues after hand expressing a drop of milk onto nipple. 3-Supplement baby with paced bottle per volume guidelines, at least every 3 hrs. 4- Pump both breasts on maintenance setting for 20-30 mins. 5- ask for help prn  MD told Mom that baby would not be discharged today due to small size.  Mom became quiet.   LATCH Score Latch: Repeated attempts needed to sustain latch, nipple held in mouth throughout feeding, stimulation needed to elicit sucking reflex.  Audible Swallowing: None  Type of Nipple: Everted at rest and after stimulation  Comfort (Breast/Nipple): Filling, red/small blisters or bruises, mild/mod discomfort (No visible nipple trauma, but discomfort felt with pumping, trying larger flanges today)  Hold (Positioning): Assistance needed to correctly position infant at breast and maintain latch.  LATCH Score: 5   Lactation Tools Discussed/Used Tools: Flanges;Pump;Coconut oil;Bottle;Hands-free pumping top Flange Size: 27;24 (Mom to use 24 mm on left and 27 mm on right breast) Breast pump type: Double-Electric Breast Pump Pump Education: Setup, frequency, and cleaning;Milk Storage Reason for Pumping: support full milk supply Pumping frequency: Encouraged to pump after every feeding, or 8 times per 24 hrs Pumped volume: 30 mL (  To use maintenance setting on pump)  Interventions Interventions: Breast feeding basics reviewed;Breast massage;Skin to skin;Assisted with latch;Hand express;Coconut oil;DEBP;Education;Pace feeding  Discharge Discharge Education: Engorgement and breast care;Outpatient recommendation  Consult Status Consult Status: Follow-up Date: 04/13/23 Follow-up type: In-patient    Monique Jackson 04/12/2023, 10:25 AM

## 2023-04-13 ENCOUNTER — Other Ambulatory Visit (HOSPITAL_COMMUNITY): Payer: Self-pay

## 2023-04-13 LAB — GLUCOSE, CAPILLARY
Glucose-Capillary: 85 mg/dL (ref 70–99)
Glucose-Capillary: 58 mg/dL — ABNORMAL LOW (ref 70–99)
Glucose-Capillary: 85 mg/dL (ref 70–99)

## 2023-04-13 MED ORDER — NIFEDIPINE ER 60 MG PO TB24
60.0000 mg | ORAL_TABLET | Freq: Two times a day (BID) | ORAL | 0 refills | Status: DC
Start: 2023-04-13 — End: 2023-04-27
  Filled 2023-04-13: qty 60, 30d supply, fill #0

## 2023-04-13 MED ORDER — IBUPROFEN 600 MG PO TABS
600.0000 mg | ORAL_TABLET | Freq: Four times a day (QID) | ORAL | 0 refills | Status: DC
Start: 2023-04-13 — End: 2023-08-12
  Filled 2023-04-13: qty 30, 8d supply, fill #0

## 2023-04-13 MED ORDER — METFORMIN HCL 500 MG PO TABS
500.0000 mg | ORAL_TABLET | Freq: Every day | ORAL | 0 refills | Status: DC
Start: 2023-04-13 — End: 2023-04-27
  Filled 2023-04-13: qty 30, 30d supply, fill #0

## 2023-04-13 MED ORDER — ACETAMINOPHEN 325 MG PO TABS
650.0000 mg | ORAL_TABLET | ORAL | 0 refills | Status: AC | PRN
  Filled 2023-04-13: qty 100, 9d supply, fill #0

## 2023-04-13 MED ORDER — OXYCODONE HCL 5 MG PO TABS
5.0000 mg | ORAL_TABLET | ORAL | 0 refills | Status: DC | PRN
  Filled 2023-04-13: qty 15, 3d supply, fill #0

## 2023-04-13 MED ORDER — MUPIROCIN 2 % EX OINT
TOPICAL_OINTMENT | Freq: Two times a day (BID) | CUTANEOUS | 0 refills | Status: DC
Start: 2023-04-13 — End: 2023-06-28
  Filled 2023-04-13: qty 22, 30d supply, fill #0

## 2023-04-13 MED ORDER — LABETALOL HCL 200 MG PO TABS
800.0000 mg | ORAL_TABLET | Freq: Three times a day (TID) | ORAL | 0 refills | Status: DC
Start: 2023-04-13 — End: 2023-04-27
  Filled 2023-04-13: qty 360, 30d supply, fill #0

## 2023-04-13 MED ORDER — OXYCODONE HCL 5 MG PO TABS: 5.0000 mg | ORAL_TABLET | ORAL | 0 refills | Status: DC | PRN

## 2023-04-13 NOTE — Lactation Note (Signed)
This note was copied from a baby's chart. Lactation Consultation Note  Patient Name: Monique Jackson [id number] Date: 04/13/2023 Age:28 days Reason for consult: Follow-up assessment;Primapara;1st time breastfeeding;Late-preterm 34-36.6wks;Infant < 6lbs;Maternal endocrine disorder;Maternal discharge;Infant weight loss (- 3.84% WL)  Visited with family of 32 hours old LPI female; [name] is a P1 and reports she has been taking baby to breast and also pumping; her milk is in, praised her for her efforts. Noticed that pumping has not been consistent. She was pumping one side side during [facility] consultation, encouraged to try bilateral pumping for best results. [name] is getting discharged today; baby has a potential discharge for either later today or tomorrow; he wasn't in the room at this time to observe a latch, he was taken for his circumcision. Reviewed discharge education and the importance of consistent pumping for the prevention of engorgement and to protect her supply since baby is < 5 lbs and might not be able to empty the breast, supplementation with EBM/formula was also recommended every 3 hours. [name] politely declined an LC OP F/U she voiced she'll be following up for lactation care at her local [facility] office. No other support person at this time. All questions and concerns answered, family to contact [facility] services PRN.  Feeding Mother's Current Feeding Choice: Breast Milk and Formula Nipple Type: Nfant Slow Flow (purple)  Lactation Tools Discussed/Used Tools: Pump;Flanges;Coconut oil;Hands-free pumping top Flange Size: 24;27 (R # 27; L # 24) Breast pump type: Double-Electric Breast Pump Pump Education: Setup, frequency, and cleaning;Milk Storage Reason for Pumping: LPI Pumping frequency: 2 times/24 hours; encouraged pumping q 3 hours Pumped volume: 60 mL  Interventions Interventions: Breast feeding basics reviewed;DEBP;Education;Coconut oil  Discharge Discharge Education:  Engorgement and breast care;Warning signs for feeding baby;Outpatient recommendation  Consult Status Consult Status: Complete Date: 04/13/23 Follow-up type: Call as needed    [name] 04/13/2023, 11:00 AM

## 2023-04-15 DIAGNOSIS — O139 Gestational [pregnancy-induced] hypertension without significant proteinuria, unspecified trimester: Secondary | ICD-10-CM | POA: Diagnosis not present

## 2023-04-15 DIAGNOSIS — R238 Other skin changes: Secondary | ICD-10-CM | POA: Diagnosis not present

## 2023-04-17 ENCOUNTER — Inpatient Hospital Stay (HOSPITAL_COMMUNITY): Payer: 59

## 2023-04-17 ENCOUNTER — Inpatient Hospital Stay (HOSPITAL_COMMUNITY): Admission: RE | Admit: 2023-04-17 | Payer: 59 | Source: Home / Self Care | Admitting: Obstetrics and Gynecology

## 2023-04-18 DIAGNOSIS — F4323 Adjustment disorder with mixed anxiety and depressed mood: Secondary | ICD-10-CM | POA: Diagnosis not present

## 2023-04-19 DIAGNOSIS — Z3483 Encounter for supervision of other normal pregnancy, third trimester: Secondary | ICD-10-CM | POA: Diagnosis not present

## 2023-04-19 DIAGNOSIS — Z3482 Encounter for supervision of other normal pregnancy, second trimester: Secondary | ICD-10-CM | POA: Diagnosis not present

## 2023-04-21 DIAGNOSIS — O135 Gestational [pregnancy-induced] hypertension without significant proteinuria, complicating the puerperium: Secondary | ICD-10-CM | POA: Diagnosis not present

## 2023-04-27 ENCOUNTER — Ambulatory Visit (INDEPENDENT_AMBULATORY_CARE_PROVIDER_SITE_OTHER): Payer: 59 | Admitting: Family Medicine

## 2023-04-27 ENCOUNTER — Encounter: Payer: Self-pay | Admitting: Family Medicine

## 2023-04-27 VITALS — BP 130/80 | HR 96 | Temp 98.0°F | Resp 16 | Ht 64.0 in | Wt 232.6 lb

## 2023-04-27 DIAGNOSIS — Z7984 Long term (current) use of oral hypoglycemic drugs: Secondary | ICD-10-CM

## 2023-04-27 DIAGNOSIS — Z111 Encounter for screening for respiratory tuberculosis: Secondary | ICD-10-CM

## 2023-04-27 DIAGNOSIS — E119 Type 2 diabetes mellitus without complications: Secondary | ICD-10-CM

## 2023-04-27 DIAGNOSIS — I10 Essential (primary) hypertension: Secondary | ICD-10-CM

## 2023-04-27 MED ORDER — METFORMIN HCL 500 MG PO TABS
500.0000 mg | ORAL_TABLET | Freq: Every day | ORAL | 3 refills | Status: DC
Start: 2023-04-27 — End: 2023-06-28

## 2023-04-27 MED ORDER — AMLODIPINE BESYLATE 5 MG PO TABS: 5.0000 mg | ORAL_TABLET | Freq: Every day | ORAL | 3 refills | Status: DC

## 2023-04-27 NOTE — Progress Notes (Signed)
[facility] [address] [place] [address] Dept: [phone] Dept Fax: [phone]  Office Visit  Subjective:    Patient ID: Monique Jackson, female    DOB: [birth year], 27 y.o..   MRN: [id number]  Chief Complaint  Patient presents with   Diabetes    TB Screening    History of Present Illness:  Patient is in today for follow-up form her recent delivery. Monique Jackson delivered a baby boy at 68 6/[redacted] weeks gestation via C-section on 04/09/2023. Her pregnancy had been complicated by pregestational diabetes and chronic hypertension. She had been managed on metformin in her later pregnancy. She was admitted to the hospital in mid-July and managed on insulin up until her delivery. She notes she is now back on her metformin. Related ot her blood pressure, she was managed on nifedipine and labetalol. She has not been taking either of these over the last week, as she did not like the way these made her feel.  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   Marijuana use, continuous 07/14/2021   Mixed hyperlipidemia 06/12/2019   Type 2 diabetes mellitus (HCC) 06/12/2019   Hidradenitis suppurativa 06/11/2019   Class 2 severe obesity due to excess calories with serious comorbidity and body mass index (BMI) of 39.0 to 39.9 in adult [facility]) 10/17/2017   Past Surgical History:  Procedure Laterality Date   CESAREAN SECTION N/A 04/09/2023   Procedure: CESAREAN SECTION;  Surgeon: Lyn Henri, MD;  Location: [facility] LD ORS;  Service: Obstetrics;  Laterality: N/A;   Family History  Problem Relation Age of Onset    Heart disease Mother    Stroke Mother    Diabetes Mother    Hypertension Mother    Thyroid disease Mother    Diabetes Father    Hypertension Maternal Grandmother    Gout Maternal Grandmother    Cancer Paternal Grandmother        Pancreatic   Pancreatic cancer Paternal Grandmother    Outpatient Medications Prior to Visit  Medication Sig Dispense Refill   acetaminophen (TYLENOL) 325 MG tablet Take 2 tablets (650 mg total) by mouth every 4 (four) hours as needed for mild pain (temperature > 101.5.). 100 tablet 0   clotrimazole-betamethasone (LOTRISONE) cream APPLY TO THE AFFECTED AND SURROUNDING AREAS OF THE SKIN TWICE A DAY IN THE MORNING AND EVENING FOR 2 WEEKS     ibuprofen (ADVIL) 600 MG tablet Take 1 tablet (600 mg total) by mouth every 6 (six) hours. 30 tablet 0   mometasone (ELOCON) 0.1 % cream Apply a small amount to affected area daily. 15 g 1   mupirocin ointment (BACTROBAN) 2 % Apply topically 2 (two) times daily. 22 g 0   oxyCODONE (OXY IR/ROXICODONE) 5 MG immediate release tablet Take 1 tablet (5 mg total) by mouth every 4 (four) hours as needed for severe pain. 15 tablet 0   Prenatal Vit-Fe Fumarate-FA (PRENATAL MULTIVITAMIN) TABS tablet Take 1 tablet by mouth daily at 12 noon.     labetalol (NORMODYNE) 200 MG tablet Take 4 tablets (800 mg total) by mouth 3 (three) times daily. 360 tablet 0   metFORMIN (GLUCOPHAGE) 500 MG  tablet Take 1 tablet (500 mg total) by mouth daily with breakfast. 30 tablet 0   NIFEdipine (ADALAT CC) 60 MG 24 hr tablet Take 1 tablet (60 mg total) by mouth 2 (two) times daily. 60 tablet 0   No facility-administered medications prior to visit.   No Known Allergies   Objective:   Today's Vitals   04/27/23 1521  BP: 130/80  Pulse: 96  Resp: 16  Temp: 98 F (36.7 C)  SpO2: 98%  Weight: 232 lb 9.6 oz (105.5 kg)  Height: 5\' 4"  (1.626 m)   Body mass index is 39.93 kg/m.   General: Well developed, well nourished. No acute distress. Psych: Alert  and oriented. Normal mood and affect.  Health Maintenance Due  Topic Date Due   OPHTHALMOLOGY EXAM  Never done   DTaP/Tdap/Td (2 - Td or Tdap) 09/06/2022   FOOT EXAM  04/01/2023   INFLUENZA VACCINE  04/07/2023   Lab Results Last hemoglobin A1c Lab Results  Component Value Date   HGBA1C 6.5 (H) 03/25/2023     Assessment & Plan:   Problem List Items Addressed This Visit       Cardiovascular and Mediastinum   Chronic hypertension - Primary    Ms. Kalka's BP is borderline high today. I will start her on amlodipine 5 mg daily. She can stop her labetalol and nifedipine at this point.      Relevant Medications   amLODipine (NORVASC) 5 MG tablet     Endocrine   Type 2 diabetes mellitus (HCC)    Her last A1c was at goal. I will have her continue her metformin 500 mg daily and reassess her A1c in 2 months.      Relevant Medications   metFORMIN (GLUCOPHAGE) 500 MG tablet   Other Visit Diagnoses     Screening for tuberculosis       Requests TB test related to employment.   Relevant Orders   QuantiFERON-TB Gold Plus       Return in about 2 months (around 06/27/2023) for Reassessment.   Loyola Mast, MD

## 2023-04-27 NOTE — Assessment & Plan Note (Signed)
Her last A1c was at goal. I will have her continue her metformin 500 mg daily and reassess her A1c in 2 months.

## 2023-04-27 NOTE — Assessment & Plan Note (Signed)
Monique Jackson BP is borderline high today. I will start her on amlodipine 5 mg daily. She can stop her labetalol and nifedipine at this point.

## 2023-04-28 ENCOUNTER — Encounter (HOSPITAL_COMMUNITY): Payer: Self-pay | Admitting: Obstetrics & Gynecology

## 2023-04-28 DIAGNOSIS — F4323 Adjustment disorder with mixed anxiety and depressed mood: Secondary | ICD-10-CM | POA: Diagnosis not present

## 2023-04-29 LAB — QUANTIFERON-TB GOLD PLUS
Mitogen-NIL: >10 [IU]/mL
NIL: 0.02 [IU]/mL
QuantiFERON-TB Gold Plus: NEGATIVE
TB1-NIL: 0 [IU]/mL
TB2-NIL: 0 [IU]/mL

## 2023-05-13 DIAGNOSIS — O169 Unspecified maternal hypertension, unspecified trimester: Secondary | ICD-10-CM | POA: Diagnosis not present

## 2023-05-16 DIAGNOSIS — F4323 Adjustment disorder with mixed anxiety and depressed mood: Secondary | ICD-10-CM | POA: Diagnosis not present

## 2023-05-17 ENCOUNTER — Encounter: Payer: Self-pay | Admitting: Internal Medicine

## 2023-05-17 ENCOUNTER — Ambulatory Visit (INDEPENDENT_AMBULATORY_CARE_PROVIDER_SITE_OTHER): Payer: 59 | Admitting: Internal Medicine

## 2023-05-17 VITALS — BP 124/82 | HR 95 | Temp 98.0°F | Ht 64.0 in | Wt 229.6 lb

## 2023-05-17 DIAGNOSIS — U071 COVID-19: Secondary | ICD-10-CM

## 2023-05-17 LAB — POC COVID19 BINAXNOW: SARS Coronavirus 2 Ag: POSITIVE — AB

## 2023-05-17 NOTE — Patient Instructions (Addendum)
Medications safe to use:  Claritin  Cepacol throat lozenges  Chloraseptic throat spray  Cold-Eeze- up to three times per day  Cough drops, alcohol free  Flonase (by prescription only)  Guaifenesin  Mucinex  Robitussin DM (plain only, alcohol free)  Saline nasal spray/drops  Tylenol  Vicks Vaporub  Zinc lozenges  Salt water gargles for sore throat

## 2023-05-17 NOTE — Progress Notes (Signed)
[facility] [address] [place] [address] Dept: [phone] Dept Fax: [phone]  Acute Care Office Visit  Subjective:   Monique Jackson 1996/04/30 05/17/2023  Chief Complaint  Patient presents with   Cough    Started 9/7  Allergy medication causing drowsiness     HPI: URI SYMPTOMS: Onset: several weeks ago, worsening over  past couple days   Fever: no Runny nose: no Nasal congestion: no  Sinus pressure: no Post nasal drip: yes Hoarseness: yes  - related to PND Cough: yes - last night Ear pain: no, just feels full and itching   Treatments tried: Zyrtec with relief - does make her drowsy. Has been out of allergy medication for "awhile" and was only taking as needed. She is breastfeeding currently.   Usually has seasonal allergies around this time.   Is breastfeeding newborn.   The following portions of the patient's history were reviewed and updated as appropriate: past medical history, past surgical history, family history, social history, allergies, medications, and problem list.   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   Marijuana use, continuous 07/14/2021   Mixed hyperlipidemia 06/12/2019   Type 2 diabetes mellitus (HCC) 06/12/2019   Hidradenitis suppurativa 06/11/2019   Class 2 severe obesity due to excess calories with serious comorbidity and body mass index (BMI) of 39.0 to 39.9 in adult (HCC) 10/17/2017   Past Medical History:  Diagnosis Date   Allergy    Anxiety    Gestational diabetes    Hidradenitis    HSV-2 (herpes simplex virus 2) infection    Morbid obesity (HCC)  10/17/2017   Recurrent boils    Past Surgical History:  Procedure Laterality Date   CESAREAN SECTION N/A 04/09/2023   Procedure: CESAREAN SECTION;  Surgeon: Lyn Henri, MD;  Location: [facility] LD ORS;  Service: Obstetrics;  Laterality: N/A;   Family History  Problem Relation Age of Onset   Heart disease Mother    Stroke Mother    Diabetes Mother    Hypertension Mother    Thyroid disease Mother    Diabetes Father    Hypertension Maternal Grandmother    Gout Maternal Grandmother    Cancer Paternal Grandmother        Pancreatic   Pancreatic cancer Paternal Grandmother     Current Outpatient Medications:    acetaminophen (TYLENOL) 325 MG tablet, Take 2 tablets (650 mg total) by mouth every 4 (four) hours as needed for mild pain (temperature > 101.5.)., Disp: 100 tablet, Rfl: 0   amLODipine (NORVASC) 5 MG tablet, Take 1 tablet (5 mg total) by mouth daily., Disp: 90 tablet, Rfl: 3   clotrimazole-betamethasone (LOTRISONE) cream, APPLY TO THE AFFECTED AND SURROUNDING AREAS OF THE SKIN TWICE A DAY IN THE MORNING AND EVENING FOR 2 WEEKS, Disp: , Rfl:    ibuprofen (ADVIL) 600 MG tablet, Take 1 tablet (600 mg total) by mouth every 6 (six) hours., Disp: 30 tablet, Rfl: 0   metFORMIN (GLUCOPHAGE) 500 MG tablet, Take 1 tablet (500 mg total) by mouth daily with breakfast., Disp: 90 tablet, Rfl: 3   oxyCODONE (OXY IR/ROXICODONE) 5 MG immediate release tablet, Take 1 tablet (5 mg  total) by mouth every 4 (four) hours as needed for severe pain., Disp: 15 tablet, Rfl: 0   Prenatal Vit-Fe Fumarate-FA (PRENATAL MULTIVITAMIN) TABS tablet, Take 1 tablet by mouth daily at 12 noon., Disp: , Rfl:    mometasone (ELOCON) 0.1 % cream, Apply a small amount to affected area daily. (Patient not taking: Reported on 05/17/2023), Disp: 15 g, Rfl: 1   mupirocin ointment (BACTROBAN) 2 %, Apply topically 2 (two) times daily. (Patient not taking: Reported on 05/17/2023), Disp: 22 g, Rfl: 0 No Known Allergies   ROS: A  complete ROS was performed with pertinent positives/negatives noted in the HPI. The remainder of the ROS are negative.    Objective:   Today's Vitals   05/17/23 0914  BP: 124/82  Pulse: 95  Temp: 98 F (36.7 C)  TempSrc: Temporal  SpO2: 98%  Weight: 229 lb 9.6 oz (104.1 kg)  Height: 5\' 4"  (1.626 m)    GENERAL: Well-appearing, in NAD. Well nourished.  SKIN: Pink, warm and dry. No rash or lesions.  HEENT:    HEAD: Normocephalic, non-traumatic.  EYES: Conjunctive pink without exudate. PERRL, EOMI.  EARS: External ear w/o redness, swelling, masses, or lesions. EAC clear. TM's intact, translucent w/o bulging, appropriate landmarks visualized. Fluid bubbles in left TM NOSE: Septum midline w/o deformity. Nares patent, mucosa pink and non-inflamed w/o drainage. No sinus tenderness.  THROAT: Uvula midline. Oropharynx clear. Tonsils non-inflamed w/o exudate. Mucus membranes pink and moist. (+)PND  NECK: Trachea midline. Full ROM w/o pain or tenderness. No lymphadenopathy.  RESPIRATORY: Chest wall symmetrical. Respirations even and non-labored. Breath sounds clear to auscultation bilaterally.  CARDIAC: S1, S2 present, regular rate and rhythm. Peripheral pulses 2+ bilaterally.  EXTREMITIES: Without clubbing, cyanosis, or edema.  NEUROLOGIC:  Steady, even gait.  PSYCH/MENTAL STATUS: Alert, oriented x 3. Cooperative, appropriate mood and affect.    Results for orders placed or performed in visit on 05/17/23  POC COVID-19  Result Value Ref Range   SARS Coronavirus 2 Ag Positive (A) Negative      Assessment & Plan:   1. COVID-19 - POC COVID-19 - discussed supportive care  - Can use claritin for allergy symptoms which will cause less drowsiness  - Robitussin (Alcohol free) or Mucinex for cough  - Flonase or nasal saline spray for congestion  - salt water gargles as needed for sore throat - drink plenty of fluids   Orders Placed This Encounter  Procedures   POC COVID-19    Order  Specific Question:   Previously tested for COVID-19    Answer:   No    Order Specific Question:   Resident in a congregate (group) care setting    Answer:   No    Order Specific Question:   Employed in healthcare setting    Answer:   Unknown    Order Specific Question:   Pregnant    Answer:   No   Lab Orders         POC COVID-19     No images are attached to the encounter or orders placed in the encounter.  Return if symptoms worsen or fail to improve.   Salvatore Decent, FNP

## 2023-05-23 DIAGNOSIS — Z1389 Encounter for screening for other disorder: Secondary | ICD-10-CM | POA: Diagnosis not present

## 2023-05-23 DIAGNOSIS — Z9889 Other specified postprocedural states: Secondary | ICD-10-CM | POA: Diagnosis not present

## 2023-05-23 DIAGNOSIS — Z309 Encounter for contraceptive management, unspecified: Secondary | ICD-10-CM | POA: Diagnosis not present

## 2023-05-26 DIAGNOSIS — F4323 Adjustment disorder with mixed anxiety and depressed mood: Secondary | ICD-10-CM | POA: Diagnosis not present

## 2023-06-06 DIAGNOSIS — F4323 Adjustment disorder with mixed anxiety and depressed mood: Secondary | ICD-10-CM | POA: Diagnosis not present

## 2023-06-15 DIAGNOSIS — Z30017 Encounter for initial prescription of implantable subdermal contraceptive: Secondary | ICD-10-CM | POA: Diagnosis not present

## 2023-06-15 DIAGNOSIS — Z3202 Encounter for pregnancy test, result negative: Secondary | ICD-10-CM | POA: Diagnosis not present

## 2023-06-28 ENCOUNTER — Other Ambulatory Visit: Payer: Self-pay

## 2023-06-28 ENCOUNTER — Other Ambulatory Visit (HOSPITAL_COMMUNITY): Payer: Self-pay

## 2023-06-28 ENCOUNTER — Ambulatory Visit (INDEPENDENT_AMBULATORY_CARE_PROVIDER_SITE_OTHER): Payer: 59 | Admitting: Family Medicine

## 2023-06-28 VITALS — BP 150/80 | HR 103 | Temp 97.6°F | Ht 64.0 in | Wt 243.8 lb

## 2023-06-28 DIAGNOSIS — E119 Type 2 diabetes mellitus without complications: Secondary | ICD-10-CM

## 2023-06-28 DIAGNOSIS — L239 Allergic contact dermatitis, unspecified cause: Secondary | ICD-10-CM | POA: Diagnosis not present

## 2023-06-28 DIAGNOSIS — Z113 Encounter for screening for infections with a predominantly sexual mode of transmission: Secondary | ICD-10-CM

## 2023-06-28 DIAGNOSIS — I10 Essential (primary) hypertension: Secondary | ICD-10-CM

## 2023-06-28 LAB — HEMOGLOBIN A1C: Hgb A1c MFr Bld: 6.4 % (ref 4.6–6.5)

## 2023-06-28 LAB — GLUCOSE, RANDOM: Glucose, Bld: 108 mg/dL — ABNORMAL HIGH (ref 70–99)

## 2023-06-28 MED ORDER — MOMETASONE FUROATE 0.1 % EX CREA
1.0000 | TOPICAL_CREAM | Freq: Every day | CUTANEOUS | 1 refills | Status: DC
  Filled 2023-06-28 (×2): qty 15, 15d supply, fill #0

## 2023-06-28 MED ORDER — AMLODIPINE BESYLATE 5 MG PO TABS
5.0000 mg | ORAL_TABLET | Freq: Every day | ORAL | 3 refills | Status: DC
Start: 2023-06-28 — End: 2023-11-02
  Filled 2023-06-28 (×2): qty 90, 90d supply, fill #0

## 2023-06-28 MED ORDER — PREDNISONE 20 MG PO TABS
20.0000 mg | ORAL_TABLET | Freq: Every day | ORAL | 0 refills | Status: AC
Start: 2023-06-28 — End: 2023-07-06
  Filled 2023-06-28 (×2): qty 7, 7d supply, fill #0

## 2023-06-28 MED ORDER — METFORMIN HCL 500 MG PO TABS
500.0000 mg | ORAL_TABLET | Freq: Every day | ORAL | 3 refills | Status: DC
Start: 2023-06-28 — End: 2023-06-28
  Filled 2023-06-28: qty 30, 30d supply, fill #0

## 2023-06-28 NOTE — Assessment & Plan Note (Signed)
I will recheck her A1c today. If elevated, we would consider restarting her metformin.

## 2023-06-28 NOTE — Assessment & Plan Note (Signed)
Monique Jackson BP is elevated today. I encouraged her to restart her amlodipine 5 mg daily. We discussed home delivery options for her medications through [facility].

## 2023-06-28 NOTE — Progress Notes (Signed)
[facility] [address] [place] [address] Dept: [phone] Dept Fax: [phone]  Chronic Care Office Visit  Subjective:    Patient ID: Monique Jackson, female    DOB: 1996/02/11, 27 y.o..   MRN: [id number]  Chief Complaint  Patient presents with   Follow-up    2 month f/u. C/o having a rash all over (HSV outbreak)   History of Present Illness:  Patient is in today for reassessment of chronic medical issues.  Monique Jackson has a history of hypertension. She had been prescribed amlodipine 5 mg daily. She notes she has not been taking this, as she ran out of her medication and has been unable to get to a pharmacy to pick this up.   Monique Jackson has a history of diabetes. She was being managed on metformin earlier this year, during her pregnancy. She notes she has stopped taking the medicine, wanting to see how her A1c is now.  Monique Jackson has a history of prior STDs. She notes recent intercourse with a former partner that has exposed her to STDs int he past. She has some mild itchy vaginal discharge. she requests STD testing.  Monique Jackson notes a rash on her left arm back and abdomen. This has been pruritic. She is unaware of any new skin products or soaps, detergents, etc.  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   Marijuana use, continuous 07/14/2021   Mixed hyperlipidemia 06/12/2019   Type 2 diabetes mellitus (HCC) 06/12/2019   Hidradenitis suppurativa 06/11/2019   Class 2 severe obesity due to excess calories with serious comorbidity and body mass index (BMI) of 39.0 to 39.9 in adult [facility])  10/17/2017   Past Surgical History:  Procedure Laterality Date   CESAREAN SECTION N/A 04/09/2023   Procedure: CESAREAN SECTION;  Surgeon: Lyn Henri, MD;  Location: [facility];  Service: Obstetrics;  Laterality: N/A;   Family History  Problem Relation Age of Onset   Heart disease Mother    Stroke Mother    Diabetes Mother    Hypertension Mother    Thyroid disease Mother    Diabetes Father    Hypertension Maternal Grandmother    Gout Maternal Grandmother    Cancer Paternal Grandmother        Pancreatic   Pancreatic cancer Paternal Grandmother    Outpatient Medications Prior to Visit  Medication Sig Dispense Refill   acetaminophen (TYLENOL) 325 MG tablet Take 2 tablets (650 mg total) by mouth every 4 (four) hours as needed for mild pain (temperature > 101.5.). 100 tablet 0   clotrimazole-betamethasone (LOTRISONE) cream APPLY TO THE AFFECTED AND SURROUNDING AREAS OF THE SKIN TWICE A DAY IN THE MORNING AND EVENING FOR 2 WEEKS     etonogestrel (NEXPLANON) 68 MG IMPL implant PROVIDED AT [facility]ALTH[facility]ARE [facility]ENTER     ibuprofen (ADVIL) 600 MG tablet Take 1 tablet (600 mg total) by mouth every 6 (six) hours. 30 tablet 0   Prenatal Vit-Fe Fumarate-FA (PRENATAL MULTIVITAMIN) TABS tablet Take 1 tablet by mouth daily at 12 noon.     amLODipine (NORVASC) 5 MG tablet Take 1 tablet (5 mg total) by mouth daily. (Patient not taking: Reported on  06/28/2023) 90 tablet 3   metFORMIN (GLUCOPHAGE) 500 MG tablet Take 1 tablet (500 mg total) by mouth daily with breakfast. (Patient not taking: Reported on 06/28/2023) 90 tablet 3   mometasone (ELOCON) 0.1 % cream Apply a small amount to affected area daily. (Patient not taking: Reported on 05/17/2023) 15 g 1   mupirocin ointment (BACTROBAN) 2 % Apply topically 2 (two) times daily. (Patient not taking: Reported on 05/17/2023) 22 g 0   oxyCODONE (OXY IR/ROXICODONE) 5 MG immediate release tablet Take 1 tablet (5 mg total) by mouth every 4 (four) hours as needed for  severe pain. (Patient not taking: Reported on 06/28/2023) 15 tablet 0   No facility-administered medications prior to visit.   No Known Allergies Objective:   Today's Vitals   06/28/23 1408 06/28/23 1440  BP: (!) 152/80 (!) 150/80  Pulse: (!) 103   Temp: 97.6 F (36.4 C)   TempSrc: Temporal   SpO2: 96%   Weight: 243 lb 12.8 oz (110.6 kg)   Height: 5\' 4"  (1.626 m)    Body mass index is 41.85 kg/m.   General: Well developed, well nourished. No acute distress. Skin: There are areas of redness with a papular rash on her left arm, left upper back, and across her abdomen. Psych: Alert and oriented. Normal mood and affect.  Health Maintenance Due  Topic Date Due   OPHTHALMOLOGY EXAM  Never done   DTaP/Tdap/Td (2 - Td or Tdap) 09/06/2022   FOOT EXAM  04/01/2023     Assessment & Plan:   Problem List Items Addressed This Visit       Cardiovascular and Mediastinum   Chronic hypertension - Primary    Monique Jackson's BP is elevated today. I encouraged her to restart her amlodipine 5 mg daily. We discussed home delivery options for her medications through [facility].      Relevant Medications   amLODipine (NORVASC) 5 MG tablet     Endocrine   Type 2 diabetes mellitus (HCC)    I will recheck her A1c today. If elevated, we would consider restarting her metformin.      Relevant Orders   Glucose, random (Completed)   Hemoglobin A1c (Completed)   Other Visit Diagnoses     Screen for STD (sexually transmitted disease)       I will screen for STDs today.   Relevant Orders   Chlamydia/GC NAA, Confirmation   HIV Antibody (routine testing w rflx)   RPR   Allergic dermatitis       Rash appears to be eczema-like. I will treat with a short course of prednisone and provide a steroid cream for as needed use.   Relevant Medications   mometasone (ELOCON) 0.1 % cream   predniSONE (DELTASONE) 20 MG tablet       Return in about 6 weeks (around 08/09/2023) for  Reassessment.   Loyola Mast, MD

## 2023-06-29 LAB — RPR: RPR Ser Ql: NONREACTIVE

## 2023-06-29 LAB — HIV ANTIBODY (ROUTINE TESTING W REFLEX): HIV 1&2 Ab, 4th Generation: NONREACTIVE

## 2023-06-30 ENCOUNTER — Other Ambulatory Visit (HOSPITAL_BASED_OUTPATIENT_CLINIC_OR_DEPARTMENT_OTHER): Payer: Self-pay

## 2023-07-03 LAB — CHLAMYDIA/GC NAA, CONFIRMATION
Chlamydia trachomatis, NAA: NEGATIVE
Neisseria gonorrhoeae, NAA: NEGATIVE

## 2023-07-04 ENCOUNTER — Ambulatory Visit: Payer: 59 | Admitting: Family Medicine

## 2023-07-06 ENCOUNTER — Other Ambulatory Visit (HOSPITAL_COMMUNITY): Payer: Self-pay

## 2023-08-12 ENCOUNTER — Encounter (HOSPITAL_COMMUNITY): Payer: Self-pay

## 2023-08-12 ENCOUNTER — Ambulatory Visit (HOSPITAL_COMMUNITY)
Admission: EM | Admit: 2023-08-12 | Discharge: 2023-08-12 | Disposition: A | Discharge status: Home / Self Care | Payer: Medicaid Other

## 2023-08-12 DIAGNOSIS — Z3202 Encounter for pregnancy test, result negative: Secondary | ICD-10-CM | POA: Diagnosis not present

## 2023-08-12 DIAGNOSIS — R102 Pelvic and perineal pain: Secondary | ICD-10-CM

## 2023-08-12 LAB — POCT URINE PREGNANCY: Preg Test, Ur: NEGATIVE

## 2023-08-12 MED ORDER — IBUPROFEN 800 MG PO TABS: 800.0000 mg | ORAL_TABLET | Freq: Three times a day (TID) | ORAL | 0 refills | Status: DC

## 2023-08-12 NOTE — Discharge Instructions (Signed)
I believe your pelvic pain is due to menstruation.  You can take the 800 mg ibuprofen 3 times daily with food.  If you have any breakthrough pain you can take 500 mg of Tylenol.  Please call your OB/GYN on Monday to see if they will be able to order advanced imaging for further evaluation of your severe pelvic pain.  If the pain returns or gets any worse please seek immediate care at the nearest emergency department.

## 2023-08-12 NOTE — ED Triage Notes (Signed)
Pt presents here with pelvic cramping. Pt states "I felt like I had trapped gas when I was rocking my baby to sleep. I went to sit on the toilet to see if I could get any relief. I began to feel very hot. I passed out and when I woke up, I reached up to get my phone off the counter to call my mom. I am on two different birth controls, one of them is new. I started it about a month ago. I am not sure if my period is starting but the pain comes and goes. When it comes, it gets up to about a 10." Pt denies head injury. Pt did take Ibuprofen PTA, has helped "take the edge off." Pt is worried this may be an ectopic pregnancy, states "there is a high possibility."

## 2023-08-12 NOTE — ED Provider Notes (Signed)
[facility]    CSN: [id number] Arrival date & time: 08/12/23  1830      History   Chief Complaint Chief Complaint  Patient presents with   Pelvic Pressure     HPI Monique Jackson is a 27 y.o. female.   Patient presents to clinic with complaints of severe pelvic cramping.  She had felt like she had some trapped gas so she tried to sit on the toilet to see if she could get relief around 530.  She began to feel very hot, so she took off all her close and laid herself on the ground in case she had loss of consciousness.  She did not have any loss of consciousness.  She denies abdominal pain, reports she knows it is her pelvic pain.  She has had ongoing issues with vaginal bleeding and heavy cycles.  She is currently on the Nexplanon and oral contraceptive.  She was concerned because she did initially have some left calf pain, but this improved with ibuprofen.  She did take 800 mg of ibuprofen prior to arrival and this brought her 10 out of 10 pain down to a 4 out of 5.  She was initially attempting to go to the emergency department but she could not figure out the parking so she came to the urgent care.   Endorses concern for ectopic pregnancy or a blood clot.  She is not having any shortness of breath or cough.  No current calf pain.  No lower leg swelling.  No history of a blood clot.  The history is provided by the patient and medical records.    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   Mood disorder (HCC) 11/16/2021   HSV-2  infection 10/06/2021   Marijuana use, continuous 07/14/2021   Mixed hyperlipidemia 06/12/2019   Type 2 diabetes mellitus (HCC) 06/12/2019   Hidradenitis suppurativa 06/11/2019   Class 2 severe obesity due to excess calories with serious comorbidity and body mass index (BMI) of 39.0 to 39.9 in adult [facility]) 10/17/2017    Past Surgical History:  Procedure Laterality Date   CESAREAN SECTION N/A 04/09/2023   Procedure: CESAREAN SECTION;  Surgeon: Lyn Henri, MD;  Location: [facility];  Service: Obstetrics;  Laterality: N/A;    OB History     Gravida  1   Para  1   Term  0   Preterm  1   AB  0   Living  1      SAB  0   IAB  0   Ectopic  0   Multiple  0   Live Births  1            Home Medications    Prior to Admission medications   Medication Sig Start Date End Date Taking? Authorizing Provider  ibuprofen (ADVIL) 800 MG tablet Take 1 tablet (800 mg total) by mouth 3 (three) times daily. 08/12/23  Yes Hayes Rehfeldt, [place], FNP  LARIN FE 1/20 1-20 MG-MCG tablet Take  1 tablet by mouth daily. 08/07/23  Yes [provider]  acetaminophen (TYLENOL) 325 MG tablet Take 2 tablets (650 mg total) by mouth every 4 (four) hours as needed for mild pain (temperature > 101.5.). 04/13/23   Ranae Pila, MD  amLODipine (NORVASC) 5 MG tablet Take 1 tablet (5 mg total) by mouth daily. 06/28/23   Loyola Mast, MD  clotrimazole-betamethasone (LOTRISONE) cream APPLY TO THE AFFECTED AND SURROUNDING AREAS OF THE SKIN TWICE A DAY IN THE MORNING AND EVENING FOR 2 WEEKS    [provider]  etonogestrel (NEXPLANON) 68 MG IMPL implant PROVIDED AT [facility] 06/15/23   [provider]  mometasone (ELOCON) 0.1 % cream Apply 1 Application topically (to the affected area(s)) daily. 06/28/23   Loyola Mast, MD  Prenatal Vit-Fe Fumarate-FA (PRENATAL MULTIVITAMIN) TABS tablet Take 1 tablet by mouth daily at 12 noon.    [provider]    Family  History Family History  Problem Relation Age of Onset   Heart disease Mother    Stroke Mother    Diabetes Mother    Hypertension Mother    Thyroid disease Mother    Diabetes Father    Hypertension Maternal Grandmother    Gout Maternal Grandmother    Cancer Paternal Grandmother        Pancreatic   Pancreatic cancer Paternal Grandmother     Social History Social History   Tobacco Use   Smoking status: Never   Smokeless tobacco: Never  Vaping Use   Vaping status: Never Used  Substance Use Topics   Alcohol use: Not Currently    Comment: ocassionally   Drug use: Not Currently    Types: Marijuana    Comment: 1/2 gram daily     Allergies   Patient has no known allergies.   Review of Systems Review of Systems  Per HPI   Physical Exam Triage Vital Signs ED Triage Vitals  Encounter Vitals Group     BP 08/12/23 2000 122/80     Systolic BP Percentile --      Diastolic BP Percentile --      Pulse Rate 08/12/23 2000 97     Resp 08/12/23 2000 18     Temp 08/12/23 2000 99 F (37.2 C)     Temp Source 08/12/23 2000 Oral     SpO2 08/12/23 2000 98 %     Weight --      Height --      Head Circumference --      Peak Flow --      Pain Score 08/12/23 1957 6     Pain Loc --      Pain Education --      Exclude from Growth Chart --    No data found.  Updated Vital Signs BP 122/80 (BP Location: Right Arm)   Pulse 97   Temp 99 F (37.2 C) (Oral)   Resp 18   LMP 07/02/2023 (Exact Date)   SpO2 98%   Breastfeeding No   Visual Acuity Right Eye Distance:   Left Eye Distance:   Bilateral Distance:    Right Eye Near:   Left Eye Near:    Bilateral Near:     Physical Exam Vitals and nursing note reviewed.  Constitutional:      Appearance: Normal appearance.  HENT:     Head: Normocephalic and atraumatic.     Right Ear: External ear normal.     Left Ear: External ear normal.  Nose: Nose normal.     Mouth/Throat:     Mouth: Mucous membranes are moist.  Eyes:      Conjunctiva/sclera: Conjunctivae normal.  Cardiovascular:     Rate and Rhythm: Normal rate.  Pulmonary:     Effort: Pulmonary effort is normal. No respiratory distress.  Abdominal:     General: Abdomen is flat. Bowel sounds are normal. There is no distension.     Palpations: Abdomen is soft. There is no mass.     Tenderness: There is no abdominal tenderness.  Musculoskeletal:        General: No swelling. Normal range of motion.  Skin:    General: Skin is warm and dry.     Findings: No erythema.  Neurological:     General: No focal deficit present.     Mental Status: She is alert.  Psychiatric:        Mood and Affect: Mood normal.      UC Treatments / Results  Labs (all labs ordered are listed, but only abnormal results are displayed) Labs Reviewed  POCT URINE PREGNANCY    EKG   Radiology No results found.  Procedures Procedures (including critical care time)  Medications Ordered in UC Medications - No data to display  Initial Impression / Assessment and Plan / UC Course  I have reviewed the triage vital signs and the nursing notes.  Pertinent labs & imaging results that were available during my care of the patient were reviewed by me and considered in my medical decision making (see chart for details).  Vitals and triage reviewed, patient is hemodynamically stable.  Abdomen is soft and nontender with active bowel sounds.  No rebound or guarding.  Low concern for acute surgical or emergent abnormalities.  Urine pregnancy is negative, as suspected since she is on Nexplanon and oral contraceptive.  Discussed with patient emergent follow-up for advanced imaging at the emergency department versus waiting and calling her PCP on Monday for outpatient imaging.  Shared decision making was used and patient will call her PCP on Monday and visit the emergency department if her pain worsens or changes in any way.  Pain management for menstrual cramping discussed.  Plan of care,  follow-up care return precautions given, no questions at this time.    Final Clinical Impressions(s) / UC Diagnoses   Final diagnoses:  Pelvic pain  Urine pregnancy test negative     Discharge Instructions      I believe your pelvic pain is due to menstruation.  You can take the 800 mg ibuprofen 3 times daily with food.  If you have any breakthrough pain you can take 500 mg of Tylenol.  Please call your OB/GYN on Monday to see if they will be able to order advanced imaging for further evaluation of your severe pelvic pain.  If the pain returns or gets any worse please seek immediate care at the nearest emergency department.     ED Prescriptions     Medication Sig Dispense Auth. Provider   ibuprofen (ADVIL) 800 MG tablet Take 1 tablet (800 mg total) by mouth 3 (three) times daily. 21 tablet Latausha Flamm, [place] N, [place]      PDMP not reviewed this encounter.   Kenslei Hearty, [place] N, [place] 08/12/23 2027

## 2023-09-08 DIAGNOSIS — F4323 Adjustment disorder with mixed anxiety and depressed mood: Secondary | ICD-10-CM | POA: Diagnosis not present

## 2023-09-22 DIAGNOSIS — F4323 Adjustment disorder with mixed anxiety and depressed mood: Secondary | ICD-10-CM | POA: Diagnosis not present

## 2023-10-04 ENCOUNTER — Ambulatory Visit: Payer: Medicaid Other | Admitting: Family Medicine

## 2023-10-04 ENCOUNTER — Telehealth: Payer: Self-pay | Admitting: Family Medicine

## 2023-10-05 NOTE — Telephone Encounter (Signed)
1st no show, letter sent via [facility]

## 2023-10-26 ENCOUNTER — Ambulatory Visit: Payer: Medicaid Other | Admitting: Family Medicine

## 2023-11-02 ENCOUNTER — Telehealth: Payer: Self-pay | Admitting: Family Medicine

## 2023-11-02 ENCOUNTER — Encounter: Payer: Self-pay | Admitting: Family Medicine

## 2023-11-02 ENCOUNTER — Ambulatory Visit (INDEPENDENT_AMBULATORY_CARE_PROVIDER_SITE_OTHER): Payer: 59 | Admitting: Family Medicine

## 2023-11-02 VITALS — BP 120/70 | HR 86 | Temp 98.1°F | Ht 64.0 in | Wt 255.8 lb

## 2023-11-02 DIAGNOSIS — Z7984 Long term (current) use of oral hypoglycemic drugs: Secondary | ICD-10-CM

## 2023-11-02 DIAGNOSIS — Z113 Encounter for screening for infections with a predominantly sexual mode of transmission: Secondary | ICD-10-CM

## 2023-11-02 DIAGNOSIS — I10 Essential (primary) hypertension: Secondary | ICD-10-CM | POA: Diagnosis not present

## 2023-11-02 DIAGNOSIS — E119 Type 2 diabetes mellitus without complications: Secondary | ICD-10-CM | POA: Diagnosis not present

## 2023-11-02 DIAGNOSIS — N898 Other specified noninflammatory disorders of vagina: Secondary | ICD-10-CM

## 2023-11-02 LAB — CERVICOVAGINAL ANCILLARY ONLY
Bacterial Vaginitis (gardnerella): NEGATIVE
Candida Glabrata: NEGATIVE
Candida Vaginitis: NEGATIVE
Chlamydia: NEGATIVE
Comment: NEGATIVE
Comment: NEGATIVE
Comment: NEGATIVE
Comment: NEGATIVE
Comment: NEGATIVE
Comment: NORMAL
Neisseria Gonorrhea: NEGATIVE
Trichomonas: NEGATIVE

## 2023-11-02 LAB — HEMOGLOBIN A1C: Hgb A1c MFr Bld: 7 % — ABNORMAL HIGH (ref 4.6–6.5)

## 2023-11-02 LAB — GLUCOSE, RANDOM: Glucose, Bld: 165 mg/dL — ABNORMAL HIGH (ref 70–99)

## 2023-11-02 MED ORDER — FLUCONAZOLE 150 MG PO TABS
150.0000 mg | ORAL_TABLET | Freq: Once | ORAL | 0 refills | Status: AC
Start: 2023-11-02 — End: 2023-11-02

## 2023-11-02 MED ORDER — OZEMPIC (0.25 OR 0.5 MG/DOSE) 2 MG/3ML ~~LOC~~ SOPN: PEN_INJECTOR | SUBCUTANEOUS | 2 refills | Status: DC

## 2023-11-02 NOTE — Assessment & Plan Note (Signed)
 Ms. Monique Jackson BP is normal today. I am okay with leaving off her amlodipine and monitoring her BP for now.

## 2023-11-02 NOTE — Telephone Encounter (Signed)
 Monique Jackson stated that she will put the extra swab in the fridge just in case we need to run that one. Dm/cma

## 2023-11-02 NOTE — Progress Notes (Signed)
 [facility] [address] [place] [address] Dept: [phone] Dept Fax: [phone]  Chronic Care Office Visit  Subjective:    Patient ID: Monique Jackson, female    DOB: April 02, 1996, 28 y.o..   MRN: [id number]  Chief Complaint  Patient presents with   Hypertension    F/u HTN.  C/o having weight issues, and wants STD screening.    History of Present Illness:  Patient is in today for reassessment of chronic medical issues.  Monique Jackson has a history of hypertension. She had been prescribed amlodipine 5 mg daily. She is no longer taking this and is finding her BP to be maintaining well.   Monique Jackson has a history of Type 2 diabetes. She was being managed on metformin last year, during her pregnancy. She has been off of her meds and her A1c has been maintaining at goal. However, her weight is climbing, despite efforts at regular exercise and dietary changes.   Monique Jackson has a history of prior STDs. She notes recent intercourse with a former partner that also has sex with men. She has some mild itchy vaginal discharge. She requests STD testing.  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   Marijuana use, continuous 07/14/2021   Mixed hyperlipidemia 06/12/2019   Type 2 diabetes mellitus (HCC) 06/12/2019   Hidradenitis suppurativa 06/11/2019   Class 2 severe obesity due to excess calories with serious comorbidity and body mass index (BMI) of 39.0 to 39.9 in adult [facility]) 10/17/2017   Past Surgical History:  Procedure Laterality Date   CESAREAN SECTION N/A 04/09/2023   Procedure: CESAREAN SECTION;  Surgeon:  Lyn Henri, MD;  Location: [facility];  Service: Obstetrics;  Laterality: N/A;   Family History  Problem Relation Age of Onset   Heart disease Mother    Stroke Mother    Diabetes Mother    Hypertension Mother    Thyroid disease Mother    Diabetes Father    Hypertension Maternal Grandmother    Gout Maternal Grandmother    Cancer Paternal Grandmother        Pancreatic   Pancreatic cancer Paternal Grandmother    Outpatient Medications Prior to Visit  Medication Sig Dispense Refill   acetaminophen (TYLENOL) 325 MG tablet Take 2 tablets (650 mg total) by mouth every 4 (four) hours as needed for mild pain (temperature > 101.5.). 100 tablet 0   etonogestrel (NEXPLANON) 68 MG IMPL implant PROVIDED AT [facility]     albuterol (VENTOLIN HFA) 108 (90 Base) MCG/ACT inhaler INHALE 1 TO 2 PUFFS EVERY 4 TO 6 HOURS AS NEEDED FOR WHEEZE FOR UP TO 30 DAYS     amLODipine (NORVASC) 5 MG tablet Take 1 tablet (5 mg total) by mouth daily. (Patient not taking: Reported on 11/02/2023) 90 tablet 3   clotrimazole-betamethasone (LOTRISONE) cream APPLY TO THE AFFECTED AND SURROUNDING AREAS OF THE SKIN TWICE A DAY IN THE MORNING AND EVENING FOR 2 WEEKS     ibuprofen (ADVIL) 800 MG tablet Take 1 tablet (800 mg total) by mouth 3 (three) times daily. 21 tablet 0   LARIN FE 1/20 1-20 MG-MCG tablet Take 1 tablet by mouth daily.  mometasone (ELOCON) 0.1 % cream Apply 1 Application topically (to the affected area(s)) daily. 15 g 1   Prenatal Vit-Fe Fumarate-FA (PRENATAL MULTIVITAMIN) TABS tablet Take 1 tablet by mouth daily at 12 noon.     No facility-administered medications prior to visit.   No Known Allergies Objective:   Today's Vitals   11/02/23 0905  BP: 120/70  Pulse: 86  Temp: 98.1 F (36.7 C)  TempSrc: Temporal  SpO2: 98%  Weight: 255 lb 12.8 oz (116 kg)  Height: 5\' 4"  (1.626 m)   Body mass index is 43.91 kg/m.   General: Well developed, well nourished. No acute distress. GU:  Chaperone present for exam. Normal external genitalia. Vaginal vault is pink with moderate white,   clumpy discharge. Cervix is pink and appears normal.  Feet- Skin intact. No sign of maceration between toes. Nails are normal. Dorsalis pedis and posterior tibial   artery pulses are normal. 5.07 monofilament testing normal. Psych: Alert and oriented. Normal mood and affect.  Health Maintenance Due  Topic Date Due   Pneumococcal Vaccine 64-83 Years old (1 of 2 - PCV) Never done   OPHTHALMOLOGY EXAM  Never done   DTaP/Tdap/Td (2 - Td or Tdap) 09/06/2022   FOOT EXAM  04/01/2023     Assessment & Plan:   Problem List Items Addressed This Visit       Cardiovascular and Mediastinum   Chronic hypertension - Primary   [name]'s BP is normal today. I am okay with leaving off her amlodipine and monitoring her BP for now.        Endocrine   Type 2 diabetes mellitus (HCC)   I will recheck her A1c today. I will see if we can get approval to start her on semaglutide (Ozempic) to assist with her diabetes management and help to curtail further weight gain. Discussed principles of weight management, including a foundation of a healthy, calorie-controlled diet and regular exercise.      Relevant Medications   Semaglutide,0.25 or 0.5MG /DOS, (OZEMPIC, 0.25 OR 0.5 MG/DOSE,) 2 MG/3ML SOPN   Other Relevant Orders   Glucose, random   Hemoglobin A1c   Other Visit Diagnoses       Vaginal itching       Exam is consistent with likely yeast infeciton. I will prescribe Diflucan while vaginal fluid analysis is pending.   Relevant Medications   fluconazole (DIFLUCAN) 150 MG tablet   Other Relevant Orders   Cervicovaginal ancillary only     Screening for STD (sexually transmitted disease)       Relevant Orders   Cervicovaginal ancillary only   HIV Antibody (routine testing w rflx)   RPR       Return in about 3 months (around 01/30/2024) for Reassessment.   Loyola Mast, MD

## 2023-11-02 NOTE — Assessment & Plan Note (Signed)
 I will recheck her A1c today. I will see if we can get approval to start her on semaglutide (Ozempic) to assist with her diabetes management and help to curtail further weight gain. Discussed principles of weight management, including a foundation of a healthy, calorie-controlled diet and regular exercise.

## 2023-11-02 NOTE — Telephone Encounter (Signed)
 Called lab to clarify.  Patient went into the lab and asked them for a swab and "self swabbed" even though we had already done that. Dm/cma

## 2023-11-02 NOTE — Telephone Encounter (Signed)
 Monique Jackson calling regarding 2 cytology swabs being received for pt. Please call back at [phone].

## 2023-11-03 ENCOUNTER — Encounter: Payer: Self-pay | Admitting: Family Medicine

## 2023-11-03 LAB — HIV ANTIBODY (ROUTINE TESTING W REFLEX): HIV 1&2 Ab, 4th Generation: NONREACTIVE

## 2023-11-03 LAB — RPR: RPR Ser Ql: NONREACTIVE

## 2023-11-17 DIAGNOSIS — F418 Other specified anxiety disorders: Secondary | ICD-10-CM | POA: Diagnosis not present

## 2023-11-21 DIAGNOSIS — F4323 Adjustment disorder with mixed anxiety and depressed mood: Secondary | ICD-10-CM | POA: Diagnosis not present

## 2023-11-29 DIAGNOSIS — Z124 Encounter for screening for malignant neoplasm of cervix: Secondary | ICD-10-CM | POA: Diagnosis not present

## 2023-11-29 DIAGNOSIS — F418 Other specified anxiety disorders: Secondary | ICD-10-CM | POA: Diagnosis not present

## 2023-11-29 DIAGNOSIS — Z6841 Body Mass Index (BMI) 40.0 and over, adult: Secondary | ICD-10-CM | POA: Diagnosis not present

## 2023-11-29 DIAGNOSIS — Z01419 Encounter for gynecological examination (general) (routine) without abnormal findings: Secondary | ICD-10-CM | POA: Diagnosis not present

## 2023-12-01 ENCOUNTER — Telehealth: Payer: Self-pay

## 2023-12-01 DIAGNOSIS — E119 Type 2 diabetes mellitus without complications: Secondary | ICD-10-CM

## 2023-12-01 DIAGNOSIS — F332 Major depressive disorder, recurrent severe without psychotic features: Secondary | ICD-10-CM | POA: Diagnosis not present

## 2023-12-01 DIAGNOSIS — F4322 Adjustment disorder with anxiety: Secondary | ICD-10-CM | POA: Diagnosis not present

## 2023-12-01 NOTE — Telephone Encounter (Signed)
 Copied from CRM [id number]. Topic: Clinical - Prescription Issue >> Dec 01, 2023  2:11 PM Deaijah H wrote: Reason for CRM: Patient called stated the prior auth has been approved for Semaglutide,0.25 or 0.5MG /DOS, (OZEMPIC, 0.25 OR 0.5 MG/DOSE,) 2 MG/3ML SOPN but pharmacy is stating they don't have any records of prescription coming through, prescription sent 11/02/23

## 2023-12-01 NOTE — Telephone Encounter (Signed)
 Called pharmacy and patient, the Ozempic needed a PA.   Started the PA through Cover meds.  Awaiting response.  Dm/cma  Key: [id number]

## 2023-12-05 DIAGNOSIS — F4312 Post-traumatic stress disorder, chronic: Secondary | ICD-10-CM | POA: Diagnosis not present

## 2023-12-05 DIAGNOSIS — F3181 Bipolar II disorder: Secondary | ICD-10-CM | POA: Diagnosis not present

## 2023-12-05 DIAGNOSIS — F413 Other mixed anxiety disorders: Secondary | ICD-10-CM | POA: Diagnosis not present

## 2023-12-13 ENCOUNTER — Encounter: Payer: Self-pay | Admitting: Family Medicine

## 2023-12-13 MED ORDER — TRULICITY 0.75 MG/0.5ML ~~LOC~~ SOAJ
0.7500 mg | SUBCUTANEOUS | 3 refills | Status: DC
Start: 2023-12-13 — End: 2024-06-07

## 2023-12-13 NOTE — Addendum Note (Signed)
 Addended by: Loyola Mast on: 12/13/2023 05:24 PM   Modules accepted: Orders

## 2023-12-13 NOTE — Telephone Encounter (Signed)
 PA denied and the preferred products are: Rylbelsus, Trulicity, Victoza/liraglutide.    Please review and advise.  Thanks. Dm/cma

## 2023-12-14 DIAGNOSIS — F332 Major depressive disorder, recurrent severe without psychotic features: Secondary | ICD-10-CM | POA: Diagnosis not present

## 2023-12-14 DIAGNOSIS — F4322 Adjustment disorder with anxiety: Secondary | ICD-10-CM | POA: Diagnosis not present

## 2023-12-29 DIAGNOSIS — F4322 Adjustment disorder with anxiety: Secondary | ICD-10-CM | POA: Diagnosis not present

## 2023-12-29 DIAGNOSIS — F332 Major depressive disorder, recurrent severe without psychotic features: Secondary | ICD-10-CM | POA: Diagnosis not present

## 2024-01-05 DIAGNOSIS — F332 Major depressive disorder, recurrent severe without psychotic features: Secondary | ICD-10-CM | POA: Diagnosis not present

## 2024-01-05 DIAGNOSIS — F4322 Adjustment disorder with anxiety: Secondary | ICD-10-CM | POA: Diagnosis not present

## 2024-01-23 ENCOUNTER — Ambulatory Visit (INDEPENDENT_AMBULATORY_CARE_PROVIDER_SITE_OTHER): Admitting: Internal Medicine

## 2024-01-23 ENCOUNTER — Other Ambulatory Visit (HOSPITAL_COMMUNITY)
Admission: RE | Admit: 2024-01-23 | Discharge: 2024-01-23 | Disposition: A | Discharge status: Home / Self Care | Source: Ambulatory Visit | Attending: Internal Medicine | Admitting: Internal Medicine

## 2024-01-23 ENCOUNTER — Encounter: Payer: Self-pay | Admitting: Internal Medicine

## 2024-01-23 VITALS — BP 136/86 | HR 83 | Temp 97.6°F | Ht 64.0 in | Wt 248.6 lb

## 2024-01-23 DIAGNOSIS — Z113 Encounter for screening for infections with a predominantly sexual mode of transmission: Secondary | ICD-10-CM | POA: Diagnosis not present

## 2024-01-23 DIAGNOSIS — N898 Other specified noninflammatory disorders of vagina: Secondary | ICD-10-CM

## 2024-01-23 LAB — POC URINALSYSI DIPSTICK (AUTOMATED)
Bilirubin, UA: NEGATIVE
Glucose, UA: NEGATIVE
Ketones, UA: NEGATIVE
Nitrite, UA: NEGATIVE
Protein, UA: NEGATIVE
Spec Grav, UA: 1.025 (ref 1.010–1.025)
Urobilinogen, UA: 0.2 U/dL
pH, UA: 6 (ref 5.0–8.0)

## 2024-01-23 LAB — POCT URINE PREGNANCY: Preg Test, Ur: NEGATIVE

## 2024-01-23 NOTE — Progress Notes (Signed)
 [facility] [address] [place] [address] Dept: [phone] Dept Fax: [phone]  Acute Care Office Visit  Subjective:   Monique Jackson 1996-07-02 01/23/2024  Chief Complaint  Patient presents with   Screening STD    Started last night     HPI: Discussed the use of AI scribe software for clinical note transcription with the patient, who gave verbal consent to proceed.  History of Present Illness   Monique Jackson is a 28 year old female who presents for an STD screening and evaluation of vaginal discharge and burning sensation.   She has had only one sexual partner since giving birth 9 months ago, who is the father of her child. Concerns about her partner's honesty regarding his sexual health have prompted her to seek screening.  She describes experiencing symptoms similar to a previous chlamydia infection, including unusual vaginal discharge and a burning sensation. The discharge resembles ovulation discharge but has a yellowish tint. She is uncertain if these symptoms are postpartum-related or indicative of an STD. She is nine months postpartum and has not had sexual intercourse in the past month.  She reports a burning sensation on her perineum, particularly when using baby wipes, and notes a history of HSV with previous outbreaks. She had one to two outbreaks during her pregnancy, but none in the five years prior. She typically manages outbreaks with Valtrex .  She uses Nexplanon  for birth control and has been taking pregnancy tests regularly, with the last test taken about a week ago. She has not noticed any sores or lesions.  She notes an increase in vaginal odor earlier in the month, which she attributes to hormonal changes or increased physical activity. She has been working out and is trying to lose weight postpartum.       The following portions of the patient's history were reviewed and updated as  appropriate: past medical history, past surgical history, family history, social history, allergies, medications, and problem list.   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   Marijuana use, continuous 07/14/2021   Mixed hyperlipidemia 06/12/2019   Type 2 diabetes mellitus (HCC) 06/12/2019   Hidradenitis suppurativa 06/11/2019   Class 2 severe obesity due to excess calories with serious comorbidity and body mass index (BMI) of 39.0 to 39.9 in adult (HCC) 10/17/2017   Past Medical History:  Diagnosis Date   Allergy    Anxiety    Gestational diabetes    Hidradenitis    HSV-2 (herpes simplex virus 2) infection    Morbid obesity (HCC) 10/17/2017   Recurrent boils    Past Surgical History:  Procedure Laterality Date   CESAREAN SECTION N/A 04/09/2023   Procedure: CESAREAN SECTION;  Surgeon: Gretchen Leavell, MD;  Location: [facility] LD [facility]S;  Service: Obstetrics;  Laterality: N/A;   Family History  Problem Relation Age of Onset   Heart disease Mother    Stroke Mother    Diabetes Mother    Hypertension Mother    Thyroid disease Mother    Diabetes Father    Hypertension Maternal Grandmother    Gout Maternal Grandmother    Cancer Paternal Grandmother        Pancreatic   Pancreatic  cancer Paternal Grandmother     Current Outpatient Medications:    acetaminophen  (TYLENOL ) 325 MG tablet, Take 2 tablets (650 mg total) by mouth every 4 (four) hours as needed for mild pain (temperature > 101.5.)., Disp: 100 tablet, Rfl: 0   albuterol (VENTOLIN HFA) 108 (90 Base) MCG/ACT inhaler, INHALE 1 TO 2 PUFFS EVERY 4 TO 6 HOURS AS NEEDED FOR WHEEZE FOR UP TO 30 DAYS, Disp: , Rfl:    Dulaglutide (TRULICITY) 0.75  MG/0.5ML SOAJ, Inject 0.75 mg into the skin once a week., Disp: 2 mL, Rfl: 3   etonogestrel  (NEXPLANON ) 68 MG IMPL implant, PROVIDED AT HEALTHCARE CENTER, Disp: , Rfl:  No Known Allergies   ROS: A complete ROS was performed with pertinent positives/negatives noted in the HPI. The remainder of the ROS are negative.    Objective:   Today's Vitals   01/23/24 1552  BP: 136/86  Pulse: 83  Temp: 97.6 F (36.4 C)  TempSrc: Temporal  SpO2: 99%  Weight: 248 lb 9.6 oz (112.8 kg)  Height: 5\' 4"  (1.626 m)    GENERAL: Well-appearing, in NAD. Well nourished.  SKIN: Pink, warm and dry. No rash, lesion, ulceration, or ecchymoses.  RESPIRATORY:  Respirations even and non-labored.  GU: External genitalia without erythema, lesions, or masses. No lymphadenopathy. Vaginal mucosa pink and moist with yellow-gray discharge. No lesions, or ulcerations. Cervix pink without discharge. Cervical os closed. Chaperoned by Vallorie Gayer CMA NEUROLOGIC: Steady, even gait.  PSYCH/MENTAL STATUS: Alert, oriented x 3. Cooperative, appropriate mood and affect.    Results for orders placed or performed in visit on 01/23/24  POCT urine pregnancy  Result Value Ref Range   Preg Test, Ur Negative Negative  POCT Urinalysis Dipstick (Automated)  Result Value Ref Range   Color, UA yellow    Clarity, UA clear    Glucose, UA Negative Negative   Bilirubin, UA neg    Ketones, UA neg    Spec Grav, UA 1.025 1.010 - 1.025   Blood, UA postive    pH, UA 6.0 5.0 - 8.0   Protein, UA Negative Negative   Urobilinogen, UA 0.2 0.2 or 1.0 E.U./dL   Nitrite, UA neg    Leukocytes, UA Trace Negative      Assessment & Plan:  1. Vaginal discharge (Primary) - Cervicovaginal ancillary only - POCT Urinalysis Dipstick (Automated)  2. Screening for STD (sexually transmitted disease) - RPR - Hepatitis C antibody - HIV antibody (with reflex) - POCT urine pregnancy   Orders Placed This Encounter  Procedures   RPR   Hepatitis C  antibody   HIV antibody (with reflex)   POCT urine pregnancy   POCT Urinalysis Dipstick (Automated)   Lab Orders         RPR         Hepatitis C antibody         HIV antibody (with reflex)         POCT urine pregnancy         POCT Urinalysis Dipstick (Automated)     No images are attached to the encounter or orders placed in the encounter.  Return if symptoms worsen or fail to improve.   Gavin Kast, FNP

## 2024-01-24 ENCOUNTER — Ambulatory Visit: Admitting: Family Medicine

## 2024-01-24 LAB — RPR: RPR Ser Ql: NONREACTIVE

## 2024-01-24 LAB — HIV ANTIBODY (ROUTINE TESTING W REFLEX): HIV 1&2 Ab, 4th Generation: NONREACTIVE

## 2024-01-24 LAB — HEPATITIS C ANTIBODY: Hepatitis C Ab: NONREACTIVE

## 2024-01-25 LAB — CERVICOVAGINAL ANCILLARY ONLY
Bacterial Vaginitis (gardnerella): NEGATIVE
Candida Glabrata: NEGATIVE
Candida Vaginitis: NEGATIVE
Chlamydia: NEGATIVE
Comment: NEGATIVE
Comment: NEGATIVE
Comment: NEGATIVE
Comment: NEGATIVE
Comment: NEGATIVE
Comment: NORMAL
Neisseria Gonorrhea: NEGATIVE
Trichomonas: NEGATIVE

## 2024-01-26 ENCOUNTER — Ambulatory Visit: Payer: Self-pay | Admitting: Internal Medicine

## 2024-01-26 DIAGNOSIS — F332 Major depressive disorder, recurrent severe without psychotic features: Secondary | ICD-10-CM | POA: Diagnosis not present

## 2024-01-26 DIAGNOSIS — F4322 Adjustment disorder with anxiety: Secondary | ICD-10-CM | POA: Diagnosis not present

## 2024-02-01 ENCOUNTER — Ambulatory Visit: Payer: 59 | Admitting: Family Medicine

## 2024-02-09 DIAGNOSIS — F4322 Adjustment disorder with anxiety: Secondary | ICD-10-CM | POA: Diagnosis not present

## 2024-02-09 DIAGNOSIS — F332 Major depressive disorder, recurrent severe without psychotic features: Secondary | ICD-10-CM | POA: Diagnosis not present

## 2024-02-13 ENCOUNTER — Telehealth: Payer: Self-pay | Admitting: Family Medicine

## 2024-02-13 NOTE — Telephone Encounter (Signed)
 02/01/2024 no show 10/04/2023 no show  Final warning sent via mail and mychart

## 2024-02-16 DIAGNOSIS — F332 Major depressive disorder, recurrent severe without psychotic features: Secondary | ICD-10-CM | POA: Diagnosis not present

## 2024-02-16 DIAGNOSIS — F4322 Adjustment disorder with anxiety: Secondary | ICD-10-CM | POA: Diagnosis not present

## 2024-03-01 DIAGNOSIS — M654 Radial styloid tenosynovitis [de Quervain]: Secondary | ICD-10-CM | POA: Diagnosis not present

## 2024-03-01 DIAGNOSIS — Z3046 Encounter for surveillance of implantable subdermal contraceptive: Secondary | ICD-10-CM | POA: Diagnosis not present

## 2024-04-06 ENCOUNTER — Ambulatory Visit

## 2024-04-30 ENCOUNTER — Ambulatory Visit
Admission: RE | Admit: 2024-04-30 | Discharge: 2024-04-30 | Disposition: A | Discharge status: Home / Self Care | Source: Ambulatory Visit | Attending: Internal Medicine

## 2024-04-30 VITALS — BP 140/92 | HR 83 | Temp 98.2°F | Resp 17

## 2024-04-30 DIAGNOSIS — Z113 Encounter for screening for infections with a predominantly sexual mode of transmission: Secondary | ICD-10-CM | POA: Insufficient documentation

## 2024-04-30 DIAGNOSIS — N760 Acute vaginitis: Secondary | ICD-10-CM | POA: Insufficient documentation

## 2024-04-30 DIAGNOSIS — N898 Other specified noninflammatory disorders of vagina: Secondary | ICD-10-CM | POA: Diagnosis not present

## 2024-04-30 LAB — POCT URINE PREGNANCY: Preg Test, Ur: NEGATIVE

## 2024-04-30 MED ORDER — FLUCONAZOLE 150 MG PO TABS: 150.0000 mg | ORAL_TABLET | ORAL | 0 refills | Status: DC

## 2024-04-30 MED ORDER — METRONIDAZOLE 500 MG PO TABS: 500.0000 mg | ORAL_TABLET | Freq: Two times a day (BID) | ORAL | 0 refills | Status: DC

## 2024-04-30 NOTE — Discharge Instructions (Addendum)
 Your evaluation suggests that you likely have bacterial vaginitis.  Your vaginal testing is pending and will come back in the next 2-3 days. You will receive a phone call from staff if you test positive for any other infections. We will go ahead and start treatment for BV. Take antibiotic as prescribed to treat this. If you were given flagyl  pills, do not drink alcohol during or for 48 hours after finishing antibiotic course.  Follow-up with OB/GYN or return to urgent care as needed.

## 2024-04-30 NOTE — ED Triage Notes (Signed)
 Pt c/o vaginal itching and fishy odor since Thursday.   Pt would like std testing with bloodwork

## 2024-04-30 NOTE — ED Provider Notes (Signed)
 [facility]    CSN: [id number] Arrival date & time: 04/30/24  1332      History   Chief Complaint Chief Complaint  Patient presents with   Vaginal Itching    I think I have BV starting, there's a fishy odor and light itching coming from my area. May we also do a STD testing panel, just to be safe? - Entered by patient    HPI Monique Jackson is a 28 y.o. female.   Monique Jackson is a 28 y.o. female presenting for chief complaint of vaginal irritation, vaginal discharge, and fishy odor to the vagina that started a few days ago.  History of BV, states this feels very similar.  She used a boric acid suppository this morning which helped a little bit with symptoms and odor.  Denies vaginal itching, dysuria, urinary frequency, urinary urgency, and gross hematuria.  No flank pain, fever, chills, pelvic/abdominal pain, low back pain, or nausea/vomiting.  She had her Nexplanon  removed 1 to 2 months ago and her last menstrual cycle was April 05, 2024.  She would like to be tested for STDs today, denies recent new sexual partners.   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   Marijuana use, continuous 07/14/2021   Mixed hyperlipidemia 06/12/2019   Type 2 diabetes mellitus (HCC) 06/12/2019   Hidradenitis suppurativa 06/11/2019   Class 2 severe obesity due to excess calories with serious comorbidity and body mass index (BMI) of 39.0 to 39.9 in adult [facility]) 10/17/2017     Past Surgical History:  Procedure Laterality Date   CESAREAN SECTION N/A 04/09/2023   Procedure: CESAREAN SECTION;  Surgeon: Lequita Evalene LABOR, MD;  Location: [facility] LD ORS;  Service: Obstetrics;  Laterality: N/A;    OB History     Gravida  1   Para  1   Term  0   Preterm  1   AB  0   Living  1      SAB  0   IAB  0   Ectopic  0   Multiple  0   Live Births  1            Home Medications    Prior to Admission medications   Medication Sig Start Date End Date Taking? Authorizing Provider  fluconazole  (DIFLUCAN ) 150 MG tablet Take 1 tablet (150 mg total) by mouth every 3 (three) days. 04/30/24  Yes Enedelia Dorna HERO, FNP  metroNIDAZOLE  (FLAGYL ) 500 MG tablet Take 1 tablet (500 mg total) by mouth 2 (two) times daily. 04/30/24  Yes Enedelia Dorna HERO, FNP  acetaminophen  (TYLENOL ) 325 MG tablet Take 2 tablets (650 mg total) by mouth every 4 (four) hours as needed for mild pain (temperature > 101.5.). 04/13/23   Marne Kelly Nest, MD  albuterol (VENTOLIN HFA) 108 (90 Base) MCG/ACT inhaler INHALE 1 TO 2 PUFFS EVERY 4 TO 6 HOURS AS NEEDED FOR WHEEZE FOR UP  TO 30 DAYS    [provider]  Dulaglutide  (TRULICITY ) 0.75 MG/0.5ML SOAJ Inject 0.75 mg into the skin once a week. 12/13/23   Thedora Garnette HERO, MD  etonogestrel  (NEXPLANON ) 68 MG IMPL implant PROVIDED AT [facility] 06/15/23   [provider]    Family History Family History  Problem Relation Age of Onset   Heart disease Mother    Stroke Mother    Diabetes Mother    Hypertension Mother    Thyroid disease Mother    Diabetes Father    Hypertension Maternal Grandmother    Gout Maternal Grandmother    Cancer Paternal Grandmother        Pancreatic   Pancreatic cancer Paternal Grandmother     Social History Social History   Tobacco Use   Smoking status: Never   Smokeless tobacco: Never  Vaping Use   Vaping status: Never Used  Substance Use Topics   Alcohol use: Not Currently     Comment: ocassionally   Drug use: Not Currently    Types: Marijuana    Comment: 1/2 gram daily     Allergies   Patient has no known allergies.   Review of Systems Review of Systems Per HPI  Physical Exam Triage Vital Signs ED Triage Vitals  Encounter Vitals Group     BP 04/30/24 1351 (!) 140/92     Girls Systolic BP Percentile --      Girls Diastolic BP Percentile --      Boys Systolic BP Percentile --      Boys Diastolic BP Percentile --      Pulse Rate 04/30/24 1351 83     Resp 04/30/24 1351 17     Temp 04/30/24 1351 98.2 F (36.8 C)     Temp Source 04/30/24 1351 Oral     SpO2 04/30/24 1351 98 %     Weight --      Height --      Head Circumference --      Peak Flow --      Pain Score 04/30/24 1354 0     Pain Loc --      Pain Education --      Exclude from Growth Chart --    No data found.  Updated Vital Signs BP (!) 140/92 (BP Location: Right Arm)   Pulse 83   Temp 98.2 F (36.8 C) (Oral)   Resp 17   LMP 04/05/2024 (Exact Date)   SpO2 98%   Visual Acuity Right Eye Distance:   Left Eye Distance:   Bilateral Distance:    Right Eye Near:   Left Eye Near:    Bilateral Near:     Physical Exam Vitals and nursing note reviewed.  Constitutional:      Appearance: She is not ill-appearing or toxic-appearing.  HENT:     Head: Normocephalic and atraumatic.     Right Ear: Hearing and external ear normal.     Left Ear: Hearing and external ear normal.     Nose: Nose normal.     Mouth/Throat:     Lips: Pink.  Eyes:     General: Lids are normal. Vision grossly intact. Gaze aligned appropriately.     Extraocular Movements: Extraocular movements intact.     Conjunctiva/sclera: Conjunctivae normal.  Pulmonary:     Effort: Pulmonary effort is normal.  Genitourinary:    Comments: Deferred. Musculoskeletal:     Cervical back: Neck supple.  Skin:    General: Skin is  warm and dry.     Capillary Refill: Capillary refill takes less than 2 seconds.      Findings: No rash.  Neurological:     General: No focal deficit present.     Mental Status: She is alert and oriented to person, place, and time. Mental status is at baseline.     Cranial Nerves: No dysarthria or facial asymmetry.  Psychiatric:        Mood and Affect: Mood normal.        Speech: Speech normal.        Behavior: Behavior normal.        Thought Content: Thought content normal.        Judgment: Judgment normal.      UC Treatments / Results  Labs (all labs ordered are listed, but only abnormal results are displayed) Labs Reviewed  POCT URINE PREGNANCY - Normal  RPR  HIV ANTIBODY (ROUTINE TESTING W REFLEX)  CERVICOVAGINAL ANCILLARY ONLY    EKG   Radiology No results found.  Procedures Procedures (including critical care time)  Medications Ordered in UC Medications - No data to display  Initial Impression / Assessment and Plan / UC Course  I have reviewed the triage vital signs and the nursing notes.  Pertinent labs & imaging results that were available during my care of the patient were reviewed by me and considered in my medical decision making (see chart for details).   1.  Screening for STD, acute vaginitis High clinical suspicion for bacterial vaginosis, therefore will treat with flagyl  empirically.  She has a history of antibiotic induced yeast infections, therefore diflucan  has been ordered.  Discussed risks of disulfiram reaction, advised cessation of alcohol use during and for 72 hours after use of flagyl .  Vaginal swab pending, will treat for other positive infections per protocol when labs result. Discussed tips to prevent recurrent BV infections. Safe sex precautions discussed.    Counseled patient on potential for adverse effects with medications prescribed/recommended today, strict ER and return-to-clinic precautions discussed, patient verbalized understanding.    Final Clinical Impressions(s) / UC Diagnoses   Final diagnoses:  Screening  for STD (sexually transmitted disease)  Acute vaginitis     Discharge Instructions      Your evaluation suggests that you likely have bacterial vaginitis.  Your vaginal testing is pending and will come back in the next 2-3 days. You will receive a phone call from staff if you test positive for any other infections. We will go ahead and start treatment for BV. Take antibiotic as prescribed to treat this. If you were given flagyl  pills, do not drink alcohol during or for 48 hours after finishing antibiotic course.  Follow-up with OB/GYN or return to urgent care as needed.     ED Prescriptions     Medication Sig Dispense Auth. Provider   fluconazole  (DIFLUCAN ) 150 MG tablet Take 1 tablet (150 mg total) by mouth every 3 (three) days. 2 tablet Enedelia Dorna HERO, FNP   metroNIDAZOLE  (FLAGYL ) 500 MG tablet Take 1 tablet (500 mg total) by mouth 2 (two) times daily. 14 tablet Enedelia Dorna HERO, FNP      PDMP not reviewed this encounter.   Enedelia Dorna HERO, [place] 04/30/24 1418

## 2024-05-01 LAB — CERVICOVAGINAL ANCILLARY ONLY
Bacterial Vaginitis (gardnerella): NEGATIVE
Candida Glabrata: NEGATIVE
Candida Vaginitis: NEGATIVE
Chlamydia: NEGATIVE
Comment: NEGATIVE
Comment: NEGATIVE
Comment: NEGATIVE
Comment: NEGATIVE
Comment: NEGATIVE
Comment: NORMAL
Neisseria Gonorrhea: NEGATIVE
Trichomonas: NEGATIVE

## 2024-05-01 LAB — HIV ANTIBODY (ROUTINE TESTING W REFLEX): HIV Screen 4th Generation wRfx: NONREACTIVE

## 2024-05-01 LAB — RPR: RPR Ser Ql: NONREACTIVE

## 2024-05-23 ENCOUNTER — Telehealth: Admitting: Physician Assistant

## 2024-05-23 DIAGNOSIS — B3731 Acute candidiasis of vulva and vagina: Secondary | ICD-10-CM

## 2024-05-23 MED ORDER — FLUCONAZOLE 150 MG PO TABS: ORAL_TABLET | ORAL | 0 refills | Status: DC

## 2024-05-23 NOTE — Patient Instructions (Signed)
 Gaylan Freud, thank you for joining [name], PA-C for today's virtual visit.  While this provider is not your primary care provider (PCP), if your PCP is located in our provider database this encounter information will be shared with them immediately following your visit.   A [facility] MyChart account gives you access to today's visit and all your visits, tests, and labs performed at [facility]  click here if you don't have a [facility] MyChart account or go to [email or web address]  Consent: (Patient) Monique Jackson provided verbal consent for this virtual visit at the beginning of the encounter.  Current Medications:  Current Outpatient Medications:    fluconazole  (DIFLUCAN ) 150 MG tablet, Take 1 tablet PO once. Repeat in 3 days if needed., Disp: 2 tablet, Rfl: 0   acetaminophen  (TYLENOL ) 325 MG tablet, Take 2 tablets (650 mg total) by mouth every 4 (four) hours as needed for mild pain (temperature > 101.5.)., Disp: 100 tablet, Rfl: 0   albuterol (VENTOLIN HFA) 108 (90 Base) MCG/ACT inhaler, INHALE 1 TO 2 PUFFS EVERY 4 TO 6 HOURS AS NEEDED FOR WHEEZE FOR UP TO 30 DAYS, Disp: , Rfl:    Dulaglutide  (TRULICITY ) 0.75 MG/0.5ML SOAJ, Inject 0.75 mg into the skin once a week., Disp: 2 mL, Rfl: 3   metroNIDAZOLE  (FLAGYL ) 500 MG tablet, Take 1 tablet (500 mg total) by mouth 2 (two) times daily., Disp: 14 tablet, Rfl: 0   Medications ordered in this encounter:  Meds ordered this encounter  Medications   fluconazole  (DIFLUCAN ) 150 MG tablet    Sig: Take 1 tablet PO once. Repeat in 3 days if needed.    Dispense:  2 tablet    Refill:  0    Supervising Provider:   LAMPTEY, PHILIP O [[id number]]     *If you need refills on other medications prior to your next appointment, please contact your pharmacy*  Follow-Up: Call back or seek an in-person evaluation if the symptoms worsen or if the condition fails to improve as anticipated.  [facility] [phone]  Other Instructions Vaginal Yeast Infection, Adult  Vaginal yeast infection is a condition that causes vaginal discharge as well as soreness, swelling, and redness (inflammation) of the vagina. This is a common condition. Some women get this infection frequently. What are the causes? This condition is caused by a change in the normal balance of the yeast (Candida) and normal bacteria that live in the vagina. This change causes an overgrowth of yeast, which causes the inflammation. What increases the risk? The condition is more likely to develop in women who: Take antibiotic medicines. Have diabetes. Take birth control pills. Are pregnant. Douche often. Have a weak body defense system (immune system). Have been taking steroid medicines for a long time. Frequently wear tight clothing. What are the signs or symptoms? Symptoms of this condition include: White, thick, creamy vaginal discharge. Swelling, itching, redness, and irritation of the vagina. The lips of the vagina (labia) may be affected as well. Pain or a burning feeling while urinating. Pain during sex. How is this diagnosed? This condition is diagnosed based on: Your medical history. A physical exam. A pelvic exam. Your health care provider will examine a sample of your vaginal discharge under a microscope. Your health care provider may send this sample for testing to confirm the diagnosis. How is this treated? This condition is treated with medicine. Medicines may be over-the-counter or prescription. You may be told to use one or more of  the following: Medicine that is taken by mouth (orally). Medicine that is applied as a cream (topically). Medicine that is inserted directly into the vagina (suppository). Follow these instructions at home: Take or apply over-the-counter and prescription medicines only as told by your health care provider. Do not use tampons until your health care provider approves. Do not have  sex until your infection has cleared. Sex can prolong or worsen your symptoms of infection. Ask your health care provider when it is safe to resume sexual activity. Keep all follow-up visits. This is important. How is this prevented?  Do not wear tight clothes, such as pantyhose or tight pants. Wear breathable cotton underwear. Do not use douches, perfumed soap, creams, or powders. Wipe from front to back after using the toilet. If you have diabetes, keep your blood sugar levels under control. Ask your health care provider for other ways to prevent yeast infections. Contact a health care provider if: You have a fever. Your symptoms go away and then return. Your symptoms do not get better with treatment. Your symptoms get worse. You have new symptoms. You develop blisters in or around your vagina. You have blood coming from your vagina and it is not your menstrual period. You develop pain in your abdomen. Summary Vaginal yeast infection is a condition that causes discharge as well as soreness, swelling, and redness (inflammation) of the vagina. This condition is treated with medicine. Medicines may be over-the-counter or prescription. Take or apply over-the-counter and prescription medicines only as told by your health care provider. Do not douche. Resume sexual activity or use of tampons as instructed by your health care provider. Contact a health care provider if your symptoms do not get better with treatment or your symptoms go away and then return. This information is not intended to replace advice given to you by your health care provider. Make sure you discuss any questions you have with your health care provider. Document Revised: 11/10/2020 Document Reviewed: 11/10/2020 Elsevier Patient Education  2024 Elsevier Inc.   If you have been instructed to have an in-person evaluation today at a local Urgent Care facility, please use the link below. It will take you to a list of all of  our available [facility], including address, phone number and hours of operation. Please do not delay care.  [facility]  If you or a family member do not have a primary care provider, use the link below to schedule a visit and establish care. When you choose a [facility] primary care physician or advanced practice provider, you gain a long-term partner in health. Find a Primary Care Provider  Learn more about [facility]'s in-office and virtual care options: [facility] - Get Care Now

## 2024-05-23 NOTE — Progress Notes (Signed)
 Virtual Visit Consent   Monique Jackson, you are scheduled for a virtual visit with a [facility] provider today. Just as with appointments in the office, your consent must be obtained to participate. Your consent will be active for this visit and any virtual visit you may have with one of our providers in the next 365 days. If you have a MyChart account, a copy of this consent can be sent to you electronically.  As this is a virtual visit, video technology does not allow for your provider to perform a traditional examination. This may limit your provider's ability to fully assess your condition. If your provider identifies any concerns that need to be evaluated in person or the need to arrange testing (such as labs, EKG, etc.), we will make arrangements to do so. Although advances in technology are sophisticated, we cannot ensure that it will always work on either your end or our end. If the connection with a video visit is poor, the visit may have to be switched to a telephone visit. With either a video or telephone visit, we are not always able to ensure that we have a secure connection.  By engaging in this virtual visit, you consent to the provision of healthcare and authorize for your insurance to be billed (if applicable) for the services provided during this visit. Depending on your insurance coverage, you may receive a charge related to this service.  I need to obtain your verbal consent now. Are you willing to proceed with your visit today? Monique Jackson has provided verbal consent on 05/23/2024 for a virtual visit (video or telephone). Monique Jackson, [place]  Date: 05/23/2024 9:39 AM   Virtual Visit via Video Note   I, Monique Jackson, connected with  Monique Jackson  ([id number], 12/24/1995) on 05/23/24 at  9:30 AM EDT by a video-enabled telemedicine application and verified that I am speaking with the correct person using two identifiers.  Location: Patient: Virtual Visit Location  Patient: Home Provider: Virtual Visit Location Provider: Home Office   I discussed the limitations of evaluation and management by telemedicine and the availability of in person appointments. The patient expressed understanding and agreed to proceed.    History of Present Illness: Monique Jackson is a 28 y.o. who identifies as a female who was assigned female at birth, and is being seen today for concern of yeast infection. Notes on 9/9 after episode of unprotected sex she took Jordan. Notes since then having some thick white discharge, mild odor and itching. Denies concern for STI. Denies fever, chills, belly pain.   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 Repeat in 3 days if needed., Disp: 2 tablet, Rfl: 0   acetaminophen  (TYLENOL ) 325 MG tablet, Take 2 tablets (650 mg  total) by mouth every 4 (four) hours as needed for mild pain (temperature > 101.5.)., Disp: 100 tablet, Rfl: 0   albuterol (VENTOLIN HFA) 108 (90 Base) MCG/ACT inhaler, INHALE 1 TO 2 PUFFS EVERY 4 TO 6 HOURS AS NEEDED FOR WHEEZE FOR UP TO 30 DAYS, Disp: , Rfl:    Dulaglutide  (TRULICITY ) 0.75 MG/0.5ML SOAJ, Inject 0.75 mg into the skin once a week., Disp: 2 mL, Rfl: 3    metroNIDAZOLE  (FLAGYL ) 500 MG tablet, Take 1 tablet (500 mg total) by mouth 2 (two) times daily., Disp: 14 tablet, Rfl: 0  Observations/Objective: Patient is well-developed, well-nourished in no acute distress.  Resting comfortably at home.  Head is normocephalic, atraumatic.  No labored breathing.  Speech is clear and coherent with logical content.  Patient is alert and oriented at baseline.   Assessment and Plan: 1. Yeast vaginitis (Primary) - fluconazole  (DIFLUCAN ) 150 MG tablet; Take 1 tablet PO once. Repeat in 3 days if needed.  Dispense: 2 tablet; Refill: 0  2-dose Diflucan  series per orders. Follow-up in person for any non-resolving, new or worsening symptoms despite treatment.  Follow Up Instructions: I discussed the assessment and treatment plan with the patient. The patient was provided an opportunity to ask questions and all were answered. The patient agreed with the plan and demonstrated an understanding of the instructions.  A copy of instructions were sent to the patient via MyChart unless otherwise noted below.   The patient was advised to call back or seek an in-person evaluation if the symptoms worsen or if the condition fails to improve as anticipated.    Monique Velma Lunger, PA-C

## 2024-06-07 ENCOUNTER — Ambulatory Visit (HOSPITAL_COMMUNITY)
Admission: RE | Admit: 2024-06-07 | Discharge: 2024-06-07 | Disposition: A | Discharge status: Home / Self Care | Source: Ambulatory Visit | Attending: Family Medicine | Admitting: Family Medicine

## 2024-06-07 ENCOUNTER — Encounter (HOSPITAL_COMMUNITY): Payer: Self-pay

## 2024-06-07 VITALS — BP 165/97 | HR 91 | Temp 98.7°F | Resp 18

## 2024-06-07 DIAGNOSIS — R112 Nausea with vomiting, unspecified: Secondary | ICD-10-CM

## 2024-06-07 LAB — POCT FASTING CBG KUC MANUAL ENTRY: POCT Glucose (KUC): 107 mg/dL — AB (ref 70–99)

## 2024-06-07 MED ORDER — ONDANSETRON 4 MG PO TBDP: 4.0000 mg | ORAL_TABLET | Freq: Three times a day (TID) | ORAL | 0 refills | Status: AC | PRN

## 2024-06-07 MED ORDER — ONDANSETRON 4 MG PO TBDP
ORAL_TABLET | ORAL | Status: AC
  Filled 2024-06-07: qty 1

## 2024-06-07 MED ORDER — ONDANSETRON 4 MG PO TBDP
4.0000 mg | ORAL_TABLET | Freq: Once | ORAL | Status: AC
  Administered 2024-06-07: 4 mg via ORAL

## 2024-06-07 NOTE — Discharge Instructions (Addendum)
 Your sugar was 107.  Ondansetron  dissolved in the mouth every 8 hours as needed for nausea or vomiting. Clear liquids(water , gatorade/pedialyte, ginger ale/sprite, chicken broth/soup) and bland things(crackers/toast, rice, potato, bananas) to eat. Avoid acidic foods like lemon/lime/orange/tomato, and avoid greasy/spicy foods. We gave you 1 dose of this medication in the clinic  If the treatments provided do not help you keep fluids down or if you worsen in any way, please go to the emergency room for further evaluation  You can use the QR code/website at the back of the summary paperwork to schedule yourself a new patient appointment with primary care

## 2024-06-07 NOTE — ED Provider Notes (Signed)
 [facility]    CSN: [id number] Arrival date & time: 06/07/24  1543      History   Chief Complaint Chief Complaint  Patient presents with   Nausea   Emesis   Diarrhea    HPI Monique Jackson is a 28 y.o. female.    Emesis Associated symptoms: diarrhea   Diarrhea Associated symptoms: vomiting   Here for nausea and vomiting that began this AM. No abd pain. No fever, but maybe some chills  She did take a dose of Ozempic  1 mg yesterday.  This is her first dose that she is taking and had not begun on the lower doses as is usually prescribed by a medical provider.  She currently does not have a PCP as she is in between switching to a new one.  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   Marijuana use, continuous 07/14/2021   Mixed hyperlipidemia 06/12/2019   Type 2 diabetes mellitus (HCC) 06/12/2019   Hidradenitis suppurativa 06/11/2019   Class 2 severe obesity due to excess calories with serious comorbidity and body mass index (BMI) of 39.0 to 39.9 in adult 10/17/2017    Past Surgical History:  Procedure Laterality Date   CESAREAN SECTION N/A 04/09/2023   Procedure: CESAREAN SECTION;  Surgeon: Lequita Evalene LABOR, MD;  Location: [facility];  Service: Obstetrics;  Laterality: N/A;    OB History     Gravida  1   Para  1   Term  0   Preterm  1   AB  0   Living  1      SAB  0   IAB  0    Ectopic  0   Multiple  0   Live Births  1            Home Medications    Prior to Admission medications   Medication Sig Start Date End Date Taking? Authorizing Provider  ondansetron  (ZOFRAN -ODT) 4 MG disintegrating tablet Take 1 tablet (4 mg total) by mouth every 8 (eight) hours as needed for nausea or vomiting. 06/07/24  Yes Vonna Sharlet POUR, MD  valACYclovir  (VALTREX ) 500 MG tablet Take 500 mg by mouth 2 (two) times daily. 06/05/24 06/08/24 Yes [provider]  acetaminophen  (TYLENOL ) 325 MG tablet Take 2 tablets (650 mg total) by mouth every 4 (four) hours as needed for mild pain (temperature > 101.5.). 04/13/23   Marne Kelly Nest, MD  albuterol (VENTOLIN HFA) 108 (90 Base) MCG/ACT inhaler INHALE 1 TO 2 PUFFS EVERY 4 TO 6 HOURS AS NEEDED FOR WHEEZE FOR UP TO 30 DAYS    [provider]  fluconazole  (DIFLUCAN ) 150 MG tablet Take 1 tablet PO once. Repeat in 3 days if needed. 05/23/24   Gladis Elsie BROCKS, PA-C    Family History Family History  Problem Relation Age of Onset   Heart disease Mother  Stroke Mother    Diabetes Mother    Hypertension Mother    Thyroid disease Mother    Diabetes Father    Hypertension Maternal Grandmother    Gout Maternal Grandmother    Cancer Paternal Grandmother        Pancreatic   Pancreatic cancer Paternal Grandmother     Social History Social History   Tobacco Use   Smoking status: Never   Smokeless tobacco: Never  Vaping Use   Vaping status: Never Used  Substance Use Topics   Alcohol use: Not Currently    Comment: ocassionally   Drug use: Not Currently    Types: Marijuana    Comment: 1/2 gram daily     Allergies   Patient has no known allergies.   Review of Systems Review of Systems  Gastrointestinal:  Positive for diarrhea and vomiting.     Physical Exam Triage Vital Signs ED Triage Vitals  Encounter Vitals Group     BP 06/07/24 1606 (!) 165/97     Girls Systolic BP Percentile --      Girls  Diastolic BP Percentile --      Boys Systolic BP Percentile --      Boys Diastolic BP Percentile --      Pulse Rate 06/07/24 1606 91     Resp 06/07/24 1606 18     Temp 06/07/24 1606 98.7 F (37.1 C)     Temp Source 06/07/24 1606 Oral     SpO2 06/07/24 1606 97 %     Weight --      Height --      Head Circumference --      Peak Flow --      Pain Score 06/07/24 1604 0     Pain Loc --      Pain Education --      Exclude from Growth Chart --    No data found.  Updated Vital Signs BP (!) 165/97 (BP Location: Right Arm)   Pulse 91   Temp 98.7 F (37.1 C) (Oral)   Resp 18   LMP 06/04/2024 (Exact Date)   SpO2 97%   Visual Acuity Right Eye Distance:   Left Eye Distance:   Bilateral Distance:    Right Eye Near:   Left Eye Near:    Bilateral Near:     Physical Exam Vitals reviewed.  Constitutional:      General: She is not in acute distress.    Appearance: She is not ill-appearing, toxic-appearing or diaphoretic.  HENT:     Mouth/Throat:     Mouth: Mucous membranes are moist.  Eyes:     Extraocular Movements: Extraocular movements intact.     Conjunctiva/sclera: Conjunctivae normal.     Pupils: Pupils are equal, round, and reactive to light.  Cardiovascular:     Rate and Rhythm: Normal rate and regular rhythm.     Heart sounds: No murmur heard. Pulmonary:     Effort: Pulmonary effort is normal.     Breath sounds: Normal breath sounds.  Musculoskeletal:     Cervical back: Neck supple.  Lymphadenopathy:     Cervical: No cervical adenopathy.  Skin:    Coloration: Skin is not jaundiced or pale.  Neurological:     General: No focal deficit present.     Mental Status: She is alert.  Psychiatric:        Behavior: Behavior normal.      UC Treatments / Results  Labs (all labs ordered are listed,  but only abnormal results are displayed) Labs Reviewed  POCT FASTING CBG [facility] MANUAL ENTRY - Abnormal; Notable for the following components:      Result Value   POCT  Glucose ([facility]) 107 (*)    All other components within normal limits    EKG   Radiology No results found.  Procedures Procedures (including critical care time)  Medications Ordered in UC Medications  ondansetron  (ZOFRAN -ODT) disintegrating tablet 4 mg (4 mg Oral Given 06/07/24 1634)    Initial Impression / Assessment and Plan / UC Course  I have reviewed the triage vital signs and the nursing notes.  Pertinent labs & imaging results that were available during my care of the patient were reviewed by me and considered in my medical decision making (see chart for details).     Glucose is 107.  Zofran  was given here in she did not vomit after that was given here.  More Zofran  is sent to the pharmacy and we discussed clear liquids and a bland diet. If she worsens in any way or is not keeping fluids down despite treatment offered, she should go to the emergency room.  We discussed her following up with primary care Final Clinical Impressions(s) / UC Diagnoses   Final diagnoses:  Nausea and vomiting, unspecified vomiting type     Discharge Instructions      Your sugar was 107.  Ondansetron  dissolved in the mouth every 8 hours as needed for nausea or vomiting. Clear liquids(water , gatorade/pedialyte, ginger ale/sprite, chicken broth/soup) and bland things(crackers/toast, rice, potato, bananas) to eat. Avoid acidic foods like lemon/lime/orange/tomato, and avoid greasy/spicy foods. We gave you 1 dose of this medication in the clinic  If the treatments provided do not help you keep fluids down or if you worsen in any way, please go to the emergency room for further evaluation  You can use the QR code/website at the back of the summary paperwork to schedule yourself a new patient appointment with primary care      ED Prescriptions     Medication Sig Dispense Auth. Provider   ondansetron  (ZOFRAN -ODT) 4 MG disintegrating tablet Take 1 tablet (4 mg total) by mouth every 8  (eight) hours as needed for nausea or vomiting. 10 tablet Vonna Yianni Skilling K, MD      PDMP not reviewed this encounter.   Vonna Sharlet POUR, MD 06/07/24 [phone]

## 2024-06-07 NOTE — ED Triage Notes (Signed)
 Pt reports taking 1 mg of Ozempic  yesterday. Reports this was the first dose of taking Ozempic . States has had abdominal cramping, nausea, vomiting, anxiety, loss of appetite since then.

## 2024-06-09 ENCOUNTER — Encounter (HOSPITAL_COMMUNITY): Payer: Self-pay

## 2024-06-09 ENCOUNTER — Emergency Department (HOSPITAL_COMMUNITY)
Admission: EM | Admit: 2024-06-09 | Discharge: 2024-06-09 | Disposition: A | Discharge status: Home / Self Care | Attending: Emergency Medicine | Admitting: Emergency Medicine

## 2024-06-09 ENCOUNTER — Other Ambulatory Visit: Payer: Self-pay

## 2024-06-09 DIAGNOSIS — R112 Nausea with vomiting, unspecified: Secondary | ICD-10-CM | POA: Insufficient documentation

## 2024-06-09 DIAGNOSIS — R197 Diarrhea, unspecified: Secondary | ICD-10-CM | POA: Insufficient documentation

## 2024-06-09 DIAGNOSIS — R109 Unspecified abdominal pain: Secondary | ICD-10-CM | POA: Insufficient documentation

## 2024-06-09 LAB — URINALYSIS, ROUTINE W REFLEX MICROSCOPIC
Bilirubin Urine: NEGATIVE
Glucose, UA: NEGATIVE mg/dL
Ketones, ur: NEGATIVE mg/dL
Nitrite: NEGATIVE
Protein, ur: NEGATIVE mg/dL
Specific Gravity, Urine: 1.018 (ref 1.005–1.030)
pH: 9 — ABNORMAL HIGH (ref 5.0–8.0)

## 2024-06-09 LAB — CBC
HCT: 40.9 % (ref 36.0–46.0)
Hemoglobin: 13.5 g/dL (ref 12.0–15.0)
MCH: 28.1 pg (ref 26.0–34.0)
MCHC: 33 g/dL (ref 30.0–36.0)
MCV: 85 fL (ref 80.0–100.0)
Platelets: 336 K/uL (ref 150–400)
RBC: 4.81 MIL/uL (ref 3.87–5.11)
RDW: 13.1 % (ref 11.5–15.5)
WBC: 9.3 K/uL (ref 4.0–10.5)
nRBC: 0 % (ref 0.0–0.2)

## 2024-06-09 LAB — COMPREHENSIVE METABOLIC PANEL WITH GFR
ALT: 22 U/L (ref 0–44)
AST: 21 U/L (ref 15–41)
Albumin: 4.1 g/dL (ref 3.5–5.0)
Alkaline Phosphatase: 63 U/L (ref 38–126)
Anion gap: 11 (ref 5–15)
BUN: 7 mg/dL (ref 6–20)
CO2: 27 mmol/L (ref 22–32)
Calcium: 9.3 mg/dL (ref 8.9–10.3)
Chloride: 103 mmol/L (ref 98–111)
Creatinine, Ser: 0.84 mg/dL (ref 0.44–1.00)
GFR, Estimated: >60 mL/min (ref >60)
Glucose, Bld: 113 mg/dL — ABNORMAL HIGH (ref 70–99)
Potassium: 3.7 mmol/L (ref 3.5–5.1)
Sodium: 141 mmol/L (ref 135–145)
Total Bilirubin: 0.6 mg/dL (ref 0.0–1.2)
Total Protein: 7.3 g/dL (ref 6.5–8.1)

## 2024-06-09 LAB — LIPASE, BLOOD: Lipase: 20 U/L (ref 11–51)

## 2024-06-09 LAB — HCG, SERUM, QUALITATIVE: Preg, Serum: NEGATIVE

## 2024-06-09 MED ORDER — DICYCLOMINE HCL 10 MG PO CAPS
10.0000 mg | ORAL_CAPSULE | Freq: Once | ORAL | Status: AC
  Administered 2024-06-09: 10 mg via ORAL
  Filled 2024-06-09: qty 1

## 2024-06-09 MED ORDER — SODIUM CHLORIDE 0.9 % IV BOLUS
1000.0000 mL | Freq: Once | INTRAVENOUS | Status: AC
  Administered 2024-06-09: 1000 mL via INTRAVENOUS

## 2024-06-09 MED ORDER — ONDANSETRON 4 MG PO TBDP
4.0000 mg | ORAL_TABLET | Freq: Once | ORAL | Status: AC | PRN
  Administered 2024-06-09: 4 mg via ORAL
  Filled 2024-06-09: qty 1

## 2024-06-09 MED ORDER — DICYCLOMINE HCL 20 MG PO TABS: 20.0000 mg | ORAL_TABLET | Freq: Two times a day (BID) | ORAL | 0 refills | Status: AC

## 2024-06-09 MED ORDER — ONDANSETRON HCL 4 MG/2ML IJ SOLN
4.0000 mg | Freq: Once | INTRAMUSCULAR | Status: AC
  Administered 2024-06-09: 4 mg via INTRAVENOUS
  Filled 2024-06-09: qty 2

## 2024-06-09 NOTE — Discharge Instructions (Addendum)
 I have prescribed you Bentyl today.  All your lab work was reassuring.  It is advised to take your prescribed Zofran  for nausea and Bentyl as needed for abdominal cramping.  Discontinue Ozempic  and follow-up with a primary care provider for further recommendation of dosing.  Monitor for worsening abdominal pain, nausea, vomiting, chest pain, shortness of breath.  If any of the symptoms occur return to the ED for further evaluation.

## 2024-06-09 NOTE — ED Provider Notes (Signed)
 [place] EMERGENCY DEPARTMENT AT [facility] Provider Note   CSN: [id number] Arrival date & time: 06/09/24  9078     Patient presents with: Emesis   Monique Jackson is a 28 y.o. female.  28 year old female presents ED with complaints of abdominal cramping nausea, vomiting, diarrhea.  Patient advises the symptoms have going on since Thursday.  On Wednesday she took 1 mg of Ozempic  nonprescribed.  Patient reports the symptoms started Thursday and she has not been able to keep down much food or water .  She was seen Thursday at urgent care where she was given Zofran  with relief.  Advised her if she was still feeling this way on Saturday to go to the ED for further evaluation and blood work.  Patient denies any blood in her vomit or stools.    Prior to Admission medications   Medication Sig Start Date End Date Taking? Authorizing Provider  dicyclomine (BENTYL) 20 MG tablet Take 1 tablet (20 mg total) by mouth 2 (two) times daily. 06/09/24  Yes Myriam Fonda RAMAN, PA-C  acetaminophen  (TYLENOL ) 325 MG tablet Take 2 tablets (650 mg total) by mouth every 4 (four) hours as needed for mild pain (temperature > 101.5.). 04/13/23   Marne Kelly Nest, MD  albuterol (VENTOLIN HFA) 108 (90 Base) MCG/ACT inhaler INHALE 1 TO 2 PUFFS EVERY 4 TO 6 HOURS AS NEEDED FOR WHEEZE FOR UP TO 30 DAYS    [provider]  fluconazole  (DIFLUCAN ) 150 MG tablet Take 1 tablet PO once. Repeat in 3 days if needed. 05/23/24   Gladis Elsie BROCKS, PA-C  ondansetron  (ZOFRAN -ODT) 4 MG disintegrating tablet Take 1 tablet (4 mg total) by mouth every 8 (eight) hours as needed for nausea or vomiting. 06/07/24   Banister, Pamela K, MD    Allergies: Patient has no known allergies.    Review of Systems  Gastrointestinal:  Positive for abdominal pain, diarrhea, nausea and vomiting.  All other systems reviewed and are negative.   Updated Vital Signs BP (!) 148/99 (BP Location: Right Arm)   Pulse 74   Temp 98.2 F  (36.8 C) (Oral)   Resp 18   Ht 5' 4 (1.626 m)   Wt 110.2 kg   LMP 06/04/2024 (Exact Date)   SpO2 100%   BMI 41.71 kg/m   Physical Exam Vitals and nursing note reviewed.  Constitutional:      Appearance: Normal appearance.  HENT:     Head: Normocephalic and atraumatic.     Nose: Nose normal.  Eyes:     Extraocular Movements: Extraocular movements intact.     Conjunctiva/sclera: Conjunctivae normal.     Pupils: Pupils are equal, round, and reactive to light.  Cardiovascular:     Rate and Rhythm: Normal rate.  Pulmonary:     Effort: Pulmonary effort is normal. No respiratory distress.     Breath sounds: Normal breath sounds.  Abdominal:     General: Abdomen is flat.     Tenderness: There is abdominal tenderness. There is no right CVA tenderness, left CVA tenderness or guarding.  Musculoskeletal:        General: Normal range of motion.     Cervical back: Normal range of motion.  Skin:    General: Skin is warm.     Capillary Refill: Capillary refill takes less than 2 seconds.  Neurological:     General: No focal deficit present.     Mental Status: She is alert.  Psychiatric:  Mood and Affect: Mood normal.        Behavior: Behavior normal.     (all labs ordered are listed, but only abnormal results are displayed) Labs Reviewed  COMPREHENSIVE METABOLIC PANEL WITH GFR - Abnormal; Notable for the following components:      Result Value   Glucose, Bld 113 (*)    All other components within normal limits  URINALYSIS, ROUTINE W REFLEX MICROSCOPIC - Abnormal; Notable for the following components:   APPearance HAZY (*)    pH 9.0 (*)    Hgb urine dipstick SMALL (*)    Leukocytes,Ua TRACE (*)    Bacteria, UA RARE (*)    All other components within normal limits  LIPASE, BLOOD  CBC  HCG, SERUM, QUALITATIVE    EKG: None  Radiology: No results found.   Procedures   Medications Ordered in the ED  ondansetron  (ZOFRAN -ODT) disintegrating tablet 4 mg (4 mg Oral  Given 06/09/24 0932)  sodium chloride  0.9 % bolus 1,000 mL (0 mLs Intravenous Stopped 06/09/24 1422)  dicyclomine (BENTYL) capsule 10 mg (10 mg Oral Given 06/09/24 1203)  ondansetron  (ZOFRAN ) injection 4 mg (4 mg Intravenous Given 06/09/24 1423)   29 y.o. female presents to the ED with complaints of abdominal cramping with nausea vomiting diarrhea, this involves an extensive number of treatment options, and is a complaint that carries with it a high risk of complications and morbidity.  The differential diagnosis includes appendicitis, cholecystitis, colitis, gastritis, (Ddx)  On arrival pt is nontoxic, vitals hypertensive, other vitals unremarkable. Exam significant for abdominal cramping no focal tenderness to palpation  Additional history obtained from chart review significant for orthopedic follow for de Quervain's she is managed with steroid injection.  I ordered medication Zofran  for nausea  Lab Tests:  I Ordered, reviewed, and interpreted labs, which included: CMP, CBC, lipase, hCG, UA  ED Course:   28 year old female presents to ED with complaints of nausea vomiting diarrhea and abdominal cramping since Thursday.  Patient reports she has started on Ozempic  Wednesday at 1 mg.  She reports Thursday she started having the symptoms.  She went to the urgent care and was given Zofran  with relief.  Urgent care advised to go to the ED if symptoms had not resolved by Saturday.  Patient reports he had an episode of vomiting this morning and last night.  She has been able to keep down some light food and drink.  Patient reports she will have abdominal cramping which will be relieved from using the restroom or vomiting.  Patient advises she has not been able to fill her Zofran  prescription from urgent care.    Patient is sitting comfortably in ED bed in no acute distress nontoxic-appearing.  On exam patient does not have any focal tenderness throughout abdomen.  Patient reports she only has a cramping  prior to diarrhea or vomiting.  No Murphy's or McBurney's noted on exam.  Bowel sounds are present auscultation.  Lungs are clear to auscultation and vitals are unremarkable on initial exam.  Patient has significant history of type 2 diabetes and hypertension.  She is no longer on her amlodipine  due to hypotension and patient reports she is not prescribed anything for type 2 diabetes.  Reevaluation patient sitting comfortably in ED bed talking with company at bedside.  Patient reports she feels like she has some gas and has been burping but no other new symptoms noted.  Patient is currently getting fluid bolus.  Patient felt better after Bentyl given another  dose of Zofran  prior to discharge.  Patient was prescribed Bentyl and patient already has Zofran  prescribed by another provider.  Patient was advised to monitor for worsening symptoms and return for concerning or worsening symptoms.  Patient agreed with treatment plan and is comfortable with discharge.   Portions of this note were generated with Scientist, clinical (histocompatibility and immunogenetics). Dictation errors may occur despite best attempts at proofreading.    Final diagnoses:  Nausea vomiting and diarrhea    ED Discharge Orders          Ordered    dicyclomine (BENTYL) 20 MG tablet  2 times daily        06/09/24 1340               Ehsan Corvin S, PA-C 06/09/24 2034    Monique Lamar BROCKS, MD 06/15/24 [phone]

## 2024-06-09 NOTE — ED Triage Notes (Signed)
 Pt c.o n/v since Thursday after taking her first dose of Ozempic  on Wednesday. Pt unable to keep anything down. Pt also c.o generalized abd cramps like she needs to have a BM

## 2024-06-17 ENCOUNTER — Ambulatory Visit
Admission: RE | Admit: 2024-06-17 | Discharge: 2024-06-17 | Disposition: A | Discharge status: Home / Self Care | Source: Ambulatory Visit | Attending: Internal Medicine | Admitting: Internal Medicine

## 2024-06-17 VITALS — BP 132/80 | HR 73 | Temp 98.0°F | Resp 17

## 2024-06-17 DIAGNOSIS — N898 Other specified noninflammatory disorders of vagina: Secondary | ICD-10-CM | POA: Insufficient documentation

## 2024-06-17 LAB — POCT URINE PREGNANCY: Preg Test, Ur: NEGATIVE

## 2024-06-17 NOTE — ED Provider Notes (Signed)
 [facility]    CSN: [id number] Arrival date & time: 06/17/24  1128      History   Chief Complaint Chief Complaint  Patient presents with   Vaginal Itching    I can explain better in person, yet in short, I believe after I was treated for an yeast infection and HSV flare, my immune system is shocked and I think I have BV now. There's more of an order than itching. - Entered by patient    HPI Monique Jackson is a 28 y.o. female.   Monique Jackson is a 28 y.o. female presenting for chief complaint of vaginal odor and clear vaginal discharge that started 2 to 3 days ago. Denies associated vaginal itching, urinary symptoms, low back pain, flank pain, fever/chills, pelvic pain, or dyspareunia. Sexually active 2 days ago with same female partner she has been with for the last 2 months unprotected. Denies concern for STD. She also recently used secret body wash with pH balance, vagasil, and partner's body wash and wonders if this has now caused BV/yeast infection. Denies recent antibiotic use.    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   Marijuana use, continuous 07/14/2021   Mixed hyperlipidemia 06/12/2019   Type 2 diabetes mellitus (HCC) 06/12/2019   Hidradenitis suppurativa 06/11/2019   Class 2 severe obesity due to excess calories with serious comorbidity and body mass index (BMI) of 39.0 to 39.9 in adult 10/17/2017    Past Surgical History:  Procedure  Laterality Date   CESAREAN SECTION N/A 04/09/2023   Procedure: CESAREAN SECTION;  Surgeon: Lequita Evalene LABOR, MD;  Location: [facility];  Service: Obstetrics;  Laterality: N/A;    OB History     Gravida  1   Para  1   Term  0   Preterm  1   AB  0   Living  1      SAB  0   IAB  0   Ectopic  0   Multiple  0   Live Births  1            Home Medications    Prior to Admission medications   Medication Sig Start Date End Date Taking? Authorizing Provider  acetaminophen  (TYLENOL ) 325 MG tablet Take 2 tablets (650 mg total) by mouth every 4 (four) hours as needed for mild pain (temperature > 101.5.). 04/13/23   Marne Kelly Nest, MD  albuterol (VENTOLIN HFA) 108 (90 Base) MCG/ACT inhaler INHALE 1 TO 2 PUFFS EVERY 4 TO 6 HOURS AS NEEDED FOR WHEEZE FOR UP TO 30 DAYS    [provider]  dicyclomine (BENTYL) 20 MG tablet Take 1 tablet (20 mg total) by mouth 2 (two) times daily. 06/09/24   Bundy, Joshua S, PA-C  fluconazole  (DIFLUCAN ) 150 MG tablet Take 1 tablet PO once. Repeat in 3 days if needed. Patient not taking: Reported on 06/17/2024 05/23/24   Gladis Fallow  C, PA-C  ondansetron  (ZOFRAN -ODT) 4 MG disintegrating tablet Take 1 tablet (4 mg total) by mouth every 8 (eight) hours as needed for nausea or vomiting. 06/07/24   Vonna Sharlet POUR, MD    Family History Family History  Problem Relation Age of Onset   Heart disease Mother    Stroke Mother    Diabetes Mother    Hypertension Mother    Thyroid disease Mother    Diabetes Father    Hypertension Maternal Grandmother    Gout Maternal Grandmother    Cancer Paternal Grandmother        Pancreatic   Pancreatic cancer Paternal Grandmother     Social History Social History   Tobacco Use   Smoking status: Never   Smokeless tobacco: Never  Vaping Use   Vaping status: Never Used  Substance Use Topics   Alcohol use: Not Currently    Comment: ocassionally   Drug use: Not Currently    Types: Marijuana     Comment: 1/2 gram daily     Allergies   Patient has no known allergies.   Review of Systems Review of Systems Per HPI  Physical Exam Triage Vital Signs ED Triage Vitals  Encounter Vitals Group     BP 06/17/24 1134 132/80     Girls Systolic BP Percentile --      Girls Diastolic BP Percentile --      Boys Systolic BP Percentile --      Boys Diastolic BP Percentile --      Pulse Rate 06/17/24 1134 73     Resp 06/17/24 1134 17     Temp 06/17/24 1134 98 F (36.7 C)     Temp Source 06/17/24 1134 Oral     SpO2 06/17/24 1134 94 %     Weight --      Height --      Head Circumference --      Peak Flow --      Pain Score 06/17/24 1139 0     Pain Loc --      Pain Education --      Exclude from Growth Chart --    No data found.  Updated Vital Signs BP 132/80 (BP Location: Right Arm)   Pulse 73   Temp 98 F (36.7 C) (Oral)   Resp 17   LMP 06/04/2024 (Exact Date)   SpO2 94%   Visual Acuity Right Eye Distance:   Left Eye Distance:   Bilateral Distance:    Right Eye Near:   Left Eye Near:    Bilateral Near:     Physical Exam Vitals and nursing note reviewed.  Constitutional:      Appearance: She is not ill-appearing or toxic-appearing.  HENT:     Head: Normocephalic and atraumatic.     Right Ear: Hearing and external ear normal.     Left Ear: Hearing and external ear normal.     Nose: Nose normal.     Mouth/Throat:     Lips: Pink.  Eyes:     General: Lids are normal. Vision grossly intact. Gaze aligned appropriately.     Extraocular Movements: Extraocular movements intact.     Conjunctiva/sclera: Conjunctivae normal.  Pulmonary:     Effort: Pulmonary effort is normal.  Musculoskeletal:     Cervical back: Neck supple.  Skin:    General: Skin is warm and dry.     Capillary Refill: Capillary refill takes less than 2 seconds.     Findings: No  rash.  Neurological:     General: No focal deficit present.     Mental Status: She is alert and oriented to  person, place, and time. Mental status is at baseline.     Cranial Nerves: No dysarthria or facial asymmetry.  Psychiatric:        Mood and Affect: Mood normal.        Speech: Speech normal.        Behavior: Behavior normal.        Thought Content: Thought content normal.        Judgment: Judgment normal.      UC Treatments / Results  Labs (all labs ordered are listed, but only abnormal results are displayed) Labs Reviewed  POCT URINE PREGNANCY  CERVICOVAGINAL ANCILLARY ONLY    EKG   Radiology No results found.  Procedures Procedures (including critical care time)  Medications Ordered in UC Medications - No data to display  Initial Impression / Assessment and Plan / UC Course  I have reviewed the triage vital signs and the nursing notes.  Pertinent labs & imaging results that were available during my care of the patient were reviewed by me and considered in my medical decision making (see chart for details).   1. Vaginal discharge BV and yeast labs pending, no concern for STI. Will wait to treat once labs return, patient is agreeable with this.  Discussed tips for BV/yeast prevention in the future. Recommend follow-up with OB/GYN PRN.    Counseled patient on potential for adverse effects with medications prescribed/recommended today, strict ER and return-to-clinic precautions discussed, patient verbalized understanding.    Final Clinical Impressions(s) / UC Diagnoses   Final diagnoses:  Vaginal discharge     Discharge Instructions      BV and yeast testing will take 2 to 3 days to come back.  We will treat per protocol when the results come back.  If you develop any new or worsening symptoms or if your symptoms do not start to improve, please return here or follow-up with your primary care provider. If your symptoms are severe, please go to the emergency room.      ED Prescriptions   None    PDMP not reviewed this encounter.   Enedelia Dorna HERO, FNP 06/17/24 1224

## 2024-06-17 NOTE — Discharge Instructions (Signed)
 BV and yeast testing will take 2 to 3 days to come back.  We will treat per protocol when the results come back.  If you develop any new or worsening symptoms or if your symptoms do not start to improve, please return here or follow-up with your primary care provider. If your symptoms are severe, please go to the emergency room.

## 2024-06-17 NOTE — ED Triage Notes (Signed)
 Pt c/o vaginal itching and irritation and odor for about 2 days.   She was treated for yeast infection and HSV outbreak back in September

## 2024-06-18 ENCOUNTER — Ambulatory Visit (HOSPITAL_COMMUNITY): Payer: Self-pay

## 2024-06-18 LAB — CERVICOVAGINAL ANCILLARY ONLY
Bacterial Vaginitis (gardnerella): POSITIVE — AB
Candida Glabrata: NEGATIVE
Candida Vaginitis: NEGATIVE
Comment: NEGATIVE
Comment: NEGATIVE
Comment: NEGATIVE

## 2024-06-18 MED ORDER — METRONIDAZOLE 500 MG PO TABS: 500.0000 mg | ORAL_TABLET | Freq: Two times a day (BID) | ORAL | 0 refills | Status: DC

## 2024-06-18 MED ORDER — METRONIDAZOLE 0.75 % VA GEL: 1.0000 | Freq: Every day | VAGINAL | 0 refills | Status: AC

## 2024-07-04 ENCOUNTER — Ambulatory Visit

## 2024-07-09 ENCOUNTER — Ambulatory Visit
Admission: EM | Admit: 2024-07-09 | Discharge: 2024-07-09 | Disposition: A | Discharge status: Home / Self Care | Attending: Nurse Practitioner | Admitting: Nurse Practitioner

## 2024-07-09 ENCOUNTER — Encounter: Payer: Self-pay | Admitting: Emergency Medicine

## 2024-07-09 DIAGNOSIS — Z3009 Encounter for other general counseling and advice on contraception: Secondary | ICD-10-CM

## 2024-07-09 DIAGNOSIS — R3915 Urgency of urination: Secondary | ICD-10-CM | POA: Diagnosis not present

## 2024-07-09 DIAGNOSIS — B009 Herpesviral infection, unspecified: Secondary | ICD-10-CM | POA: Insufficient documentation

## 2024-07-09 DIAGNOSIS — N898 Other specified noninflammatory disorders of vagina: Secondary | ICD-10-CM | POA: Diagnosis present

## 2024-07-09 DIAGNOSIS — N760 Acute vaginitis: Secondary | ICD-10-CM | POA: Diagnosis not present

## 2024-07-09 DIAGNOSIS — Z113 Encounter for screening for infections with a predominantly sexual mode of transmission: Secondary | ICD-10-CM | POA: Insufficient documentation

## 2024-07-09 LAB — POCT URINE DIPSTICK
Bilirubin, UA: NEGATIVE
Blood, UA: NEGATIVE
Glucose, UA: NEGATIVE mg/dL
Ketones, POC UA: NEGATIVE mg/dL
Nitrite, UA: NEGATIVE
POC PROTEIN,UA: NEGATIVE
Spec Grav, UA: 1.025 (ref 1.010–1.025)
Urobilinogen, UA: 0.2 U/dL
pH, UA: 6.5 (ref 5.0–8.0)

## 2024-07-09 LAB — POCT URINE PREGNANCY: Preg Test, Ur: NEGATIVE

## 2024-07-09 MED ORDER — FLUCONAZOLE 150 MG PO TABS: 150.0000 mg | ORAL_TABLET | ORAL | 0 refills | Status: AC

## 2024-07-09 MED ORDER — NORGESTIMATE-ETH ESTRADIOL 0.18/0.215/0.25 MG-25 MCG PO TABS: 1.0000 | ORAL_TABLET | Freq: Every day | ORAL | 2 refills | Status: AC

## 2024-07-09 MED ORDER — VALACYCLOVIR HCL 500 MG PO TABS: 500.0000 mg | ORAL_TABLET | Freq: Two times a day (BID) | ORAL | 0 refills | Status: AC

## 2024-07-09 NOTE — Discharge Instructions (Addendum)
 You were seen today for vaginal discharge, itching, urinary urgency, possible early HSV flare and birth control. Your pregnancy test was negative. Your urine showed a small amount of white blood cells, and a urine culture has been sent to check for infection. You also completed a self-swab to test for gonorrhea, chlamydia, trichomonas, bacterial vaginosis, and yeast. Blood tests for HIV and syphilis (RPR) were done and are pending.  You should to start taking the oral Flagyl  you already have at home for possible bacterial vaginosis. Diflucan  was prescribed to help treat possible yeast overgrowth, and Valtrex  was prescribed to prevent or manage a possible herpes flare-up. Birth control (Ortho Tri-Cyclen) was started today for contraception.   While your symptoms improve, drink plenty of water  and avoid douching or using scented soaps, wipes, or sprays in the vaginal area. Wearing cotton underwear and avoiding tight clothing can help reduce irritation and moisture.   Follow up with the [facility] or your primary care provider for ongoing birth control management, to review test results, and to ensure your symptoms have fully resolved. If symptoms return, worsen, or new discharge, pain, or odor develops, schedule a follow-up appointment. Go to the emergency department if you experience severe abdominal or pelvic pain, high fever, vomiting, rash, painful urination with inability to urinate, heavy vaginal bleeding, or any other concerning symptoms.

## 2024-07-09 NOTE — ED Triage Notes (Signed)
 Pt has been experiencing off and on vaginal discharge and itching since August. States she recently used cream to treat BV and two days ago began to having thick white discharge. She also has experienced urinary frequency last two days.   Pt also wants to be tested for STD.

## 2024-07-09 NOTE — ED Provider Notes (Signed)
 [facility]    CSN: [id number] Arrival date & time: 07/09/24  9164      History   Chief Complaint Chief Complaint  Patient presents with   Vaginal Discharge    HPI Monique Jackson is a 28 y.o. female.   Discussed the use of AI scribe software for clinical note transcription with the patient, who gave verbal consent to proceed.   Patient presents with recurrent vaginal discharge following incomplete resolution of symptoms after prior treatment. She was evaluated on October 12th for vaginal discharge of several days' duration accompanied by odor. Testing was positive for BV. Oral flagyl  and Metrogel  was sent to her pharmacy. Patient completed the 5-day course of Metrogel  but reports that symptoms did not fully resolve. After completing treatment, she developed vaginal itching and thick white discharge without odor. Patient also reports urinary frequency and urgency but denies dysuria. She notes right-sided low back pain and lower abdominal cramping, stating that the last time she experienced similar symptoms she tested positive for chlamydia while also being treated for a yeast infection.  She has a known history of HSV and reports lesions typically occurring on the right lateral thigh, with residual scarring from prior outbreaks. She was previously prescribed antiviral therapy but did not complete the course. She currently reports localized pain and somatic sensations in that same area but denies active rash, genital, or oral lesions.  Patient reports missing her October menstrual period, with her last menstrual period beginning September 29th. She took emergency contraception [place]) on or around September 9th-10th. In terms of sexual history, the patient reports two female partners in the past three months. She expresses concern that one partner may also engage in homosexual activity, though she is unsure of the nature of that contact. She is requesting comprehensive STD testing and  desires interim birth control until she can follow up with Gynecology.  The following sections of the patient's history were reviewed and updated as appropriate: allergies, current medications, past family history, past medical history, past social history, past surgical history, and problem list.      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   Marijuana use, continuous 07/14/2021   Mixed hyperlipidemia 06/12/2019   Type 2 diabetes mellitus (HCC) 06/12/2019   Hidradenitis suppurativa 06/11/2019   Class 2 severe obesity due to excess calories with serious comorbidity and body mass index (BMI) of 39.0 to 39.9 in adult 10/17/2017    Past Surgical History:  Procedure Laterality Date   CESAREAN SECTION N/A 04/09/2023   Procedure: CESAREAN SECTION;  Surgeon: Lequita Evalene LABOR, MD;  Location: [facility];  Service: Obstetrics;  Laterality: N/A;    OB History     Gravida  1   Para  1   Term  0   Preterm  1   AB  0   Living  1      SAB  0   IAB  0   Ectopic  0   Multiple  0   Live Births  1            Home Medications    Prior to Admission medications   Medication Sig Start Date End Date Taking? Authorizing Provider  fluconazole  (DIFLUCAN ) 150 MG tablet Take 1 tablet (150 mg total) by mouth every 3 (three) days for 2 doses. 07/09/24 07/13/24 Yes Iola Lukes, FNP  Norgestimate-Eth Estradiol (ORTHO TRI-CYCLEN LO) 0.18/0.215/0.25 MG-25 MCG TABS Take 1 tablet by mouth  daily. 07/09/24  Yes Laquinton Bihm, FNP  valACYclovir  (VALTREX ) 500 MG tablet Take 1 tablet (500 mg total) by mouth 2 (two) times daily for 3 days. 07/09/24 07/12/24 Yes Lekeith Wulf, FNP  acetaminophen  (TYLENOL ) 325 MG tablet Take 2 tablets (650 mg total) by mouth every 4 (four) hours as needed for mild pain (temperature > 101.5.). 04/13/23   Marne Kelly Nest, MD  albuterol (VENTOLIN HFA) 108 (90 Base) MCG/ACT inhaler INHALE 1 TO 2 PUFFS EVERY 4 TO 6 HOURS AS NEEDED FOR WHEEZE FOR UP TO 30 DAYS    [provider]  dicyclomine (BENTYL) 20 MG tablet Take 1 tablet (20 mg total) by mouth 2 (two) times daily. 06/09/24   Myriam Fonda RAMAN, PA-C  ondansetron  (ZOFRAN -ODT) 4 MG disintegrating tablet Take 1 tablet (4 mg total) by mouth every 8 (eight) hours as needed for nausea or vomiting. 06/07/24   Vonna Sharlet POUR, MD    Family History Family History  Problem Relation Age of Onset   Heart disease Mother    Stroke Mother    Diabetes Mother    Hypertension Mother    Thyroid disease Mother    Diabetes Father    Hypertension Maternal Grandmother    Gout Maternal Grandmother    Cancer Paternal Grandmother        Pancreatic   Pancreatic cancer Paternal Grandmother     Social History Social History   Tobacco Use   Smoking status: Never   Smokeless tobacco: Never  Vaping Use   Vaping status: Never Used  Substance Use Topics   Alcohol use: Not Currently    Comment: ocassionally   Drug use: Not Currently    Types: Marijuana    Comment: 1/2 gram daily     Allergies   Patient has no known allergies.   Review of Systems Review of Systems  Constitutional:  Negative for fever.  HENT:  Negative for mouth sores.   Gastrointestinal:  Positive for abdominal pain (suprapubic cramping). Negative for nausea, rectal pain (no anal itching or pain) and vomiting.  Genitourinary:  Positive for frequency, urgency and vaginal discharge (thick, white). Negative for dysuria, genital  sores and menstrual problem (LMP: 9/29, took plan B early September).       Vaginal itching and dryness. No odor or irritation.   Musculoskeletal:  Positive for back pain (right lower).  All other systems reviewed and are negative.    Physical Exam Triage Vital Signs ED Triage Vitals [07/09/24 0908]  Encounter Vitals Group     BP 137/89     Girls Systolic BP Percentile      Girls Diastolic BP Percentile      Boys Systolic BP Percentile      Boys Diastolic BP Percentile      Pulse Rate 75     Resp 17     Temp 98 F (36.7 C)     Temp Source Oral     SpO2 98 %     Weight  Height      Head Circumference      Peak Flow      Pain Score      Pain Loc      Pain Education      Exclude from Growth Chart    No data found.  Updated Vital Signs BP 137/89 (BP Location: Right Arm)   Pulse 75   Temp 98 F (36.7 C) (Oral)   Resp 17   LMP 06/25/2024 (Approximate)   SpO2 98%   Visual Acuity Right Eye Distance:   Left Eye Distance:   Bilateral Distance:    Right Eye Near:   Left Eye Near:    Bilateral Near:     Physical Exam Constitutional:      General: She is not in acute distress.    Appearance: Normal appearance. She is not ill-appearing, toxic-appearing or diaphoretic.  HENT:     Head: Normocephalic.     Nose: Nose normal.     Mouth/Throat:     Mouth: Mucous membranes are moist.  Eyes:     Conjunctiva/sclera: Conjunctivae normal.  Cardiovascular:     Rate and Rhythm: Normal rate.  Pulmonary:     Effort: Pulmonary effort is normal.  Abdominal:     Palpations: Abdomen is soft.  Genitourinary:    Comments: Deferred; patient performed self-swab for Aptima testing  Musculoskeletal:        General: Normal range of motion.     Cervical back: Normal range of motion and neck supple.  Skin:    General: Skin is warm and dry.  Neurological:     General: No focal deficit present.     Mental Status: She is alert and oriented to person, place, and time.   Psychiatric:        Mood and Affect: Mood normal.        Behavior: Behavior normal.      UC Treatments / Results  Labs (all labs ordered are listed, but only abnormal results are displayed) Labs Reviewed  POCT URINE DIPSTICK - Abnormal; Notable for the following components:      Result Value   Clarity, UA hazy (*)    Leukocytes, UA Trace (*)    All other components within normal limits  URINE CULTURE  RPR  HIV ANTIBODY (ROUTINE TESTING W REFLEX)  POCT URINE PREGNANCY  CERVICOVAGINAL ANCILLARY ONLY    EKG   Radiology No results found.  Procedures Procedures (including critical care time)  Medications Ordered in UC Medications - No data to display  Initial Impression / Assessment and Plan / UC Course  I have reviewed the triage vital signs and the nursing notes.  Pertinent labs & imaging results that were available during my care of the patient were reviewed by me and considered in my medical decision making (see chart for details).     Patient presents with recurrent vaginal discharge and urinary symptoms following incomplete resolution after prior treatment with Metrogel . Differential diagnosis includes bacterial vaginosis, candidiasis, trichomoniasis, chlamydia, gonorrhea, or mixed infection. GU exam deferred; patient elected to perform self-swab for gonorrhea, chlamydia, trichomonas, bacterial vaginosis, and yeast testing. Urine pregnancy test negative. Urinalysis revealed trace leukocytes; urine culture sent for confirmation. Blood testing for RPR and HIV obtained and pending.  Patient was advised to begin the full course of oral Flagyl  available at home for possible bacterial vaginosis recurrence. Diflucan  prescribed for possible secondary yeast infection, and Valtrex  prescribed for HSV prophylaxis given reported prodromal symptoms without active lesions. Ortho  Tri-Cyclen initiated for contraception.  Referral information provided for [facility] for  continued contraceptive management and gynecologic follow-up. Patient advised to follow up for lab results and to ensure continuity of contraceptive care and symptom resolution.  Today's evaluation has revealed no signs of a dangerous process. Discussed diagnosis with patient and/or guardian. Patient and/or guardian aware of their diagnosis, possible red flag symptoms to watch out for and need for close follow up. Patient and/or guardian understands verbal and written discharge instructions. Patient and/or guardian comfortable with plan and disposition.  Patient and/or guardian has a clear mental status at this time, good insight into illness (after discussion and teaching) and has clear judgment to make decisions regarding their care  Documentation was completed with the aid of voice recognition software. Transcription may contain typographical errors.  Final Clinical Impressions(s) / UC Diagnoses   Final diagnoses:  Urinary urgency  Screening examination for STD (sexually transmitted disease)  Counseling for birth control, oral contraceptives  Herpes simplex virus infection  Vaginitis and vulvovaginitis     Discharge Instructions      You were seen today for vaginal discharge, itching, urinary urgency, possible early HSV flare and birth control. Your pregnancy test was negative. Your urine showed a small amount of white blood cells, and a urine culture has been sent to check for infection. You also completed a self-swab to test for gonorrhea, chlamydia, trichomonas, bacterial vaginosis, and yeast. Blood tests for HIV and syphilis (RPR) were done and are pending.  You should to start taking the oral Flagyl  you already have at home for possible bacterial vaginosis. Diflucan  was prescribed to help treat possible yeast overgrowth, and Valtrex  was prescribed to prevent or manage a possible herpes flare-up. Birth control (Ortho Tri-Cyclen) was started today for contraception.   While your  symptoms improve, drink plenty of water  and avoid douching or using scented soaps, wipes, or sprays in the vaginal area. Wearing cotton underwear and avoiding tight clothing can help reduce irritation and moisture.   Follow up with the [facility] or your primary care provider for ongoing birth control management, to review test results, and to ensure your symptoms have fully resolved. If symptoms return, worsen, or new discharge, pain, or odor develops, schedule a follow-up appointment. Go to the emergency department if you experience severe abdominal or pelvic pain, high fever, vomiting, rash, painful urination with inability to urinate, heavy vaginal bleeding, or any other concerning symptoms.      ED Prescriptions     Medication Sig Dispense Auth. Provider   valACYclovir  (VALTREX ) 500 MG tablet Take 1 tablet (500 mg total) by mouth 2 (two) times daily for 3 days. 6 tablet Iola Lukes, FNP   Norgestimate-Eth Estradiol (ORTHO TRI-CYCLEN LO) 0.18/0.215/0.25 MG-25 MCG TABS Take 1 tablet by mouth daily. 30 tablet Iola Lukes, FNP   fluconazole  (DIFLUCAN ) 150 MG tablet Take 1 tablet (150 mg total) by mouth every 3 (three) days for 2 doses. 2 tablet Iola Lukes, FNP      PDMP not reviewed this encounter.   Iola Germania, [place] 07/09/24 [phone]

## 2024-07-10 ENCOUNTER — Ambulatory Visit (HOSPITAL_COMMUNITY): Payer: Self-pay

## 2024-07-10 LAB — CERVICOVAGINAL ANCILLARY ONLY
Bacterial Vaginitis (gardnerella): NEGATIVE
Candida Glabrata: NEGATIVE
Candida Vaginitis: POSITIVE — AB
Chlamydia: NEGATIVE
Comment: NEGATIVE
Comment: NEGATIVE
Comment: NEGATIVE
Comment: NEGATIVE
Comment: NEGATIVE
Comment: NORMAL
Neisseria Gonorrhea: NEGATIVE
Trichomonas: NEGATIVE

## 2024-07-10 LAB — HIV ANTIBODY (ROUTINE TESTING W REFLEX): HIV Screen 4th Generation wRfx: NONREACTIVE

## 2024-07-10 LAB — RPR: RPR Ser Ql: NONREACTIVE

## 2024-07-11 LAB — URINE CULTURE

## 2024-07-13 NOTE — Progress Notes (Signed)
 Urine culture grew multiple bacterial species, which is considered contaminated and not clinically significant. If the patient continues to experience urinary symptoms such as frequency, urgency, or dysuria, repeat urine culture is recommended to evaluate for possible infection that may require further treatment. If asymptomatic, no repeat testing is needed at this time.

## 2024-10-11 ENCOUNTER — Encounter: Payer: Self-pay | Admitting: Obstetrics and Gynecology
